# Patient Record
Sex: Female | Born: 1990 | Race: White | Hispanic: No | Smoking: Never smoker
Health system: Southern US, Community
[De-identification: ages and names within clinical notes are randomized; demographics above are authoritative.]

## PROBLEM LIST (undated history)

## (undated) ENCOUNTER — Emergency Department (HOSPITAL_COMMUNITY): Admission: EM | Payer: BLUE CROSS/BLUE SHIELD | Source: Home / Self Care

## (undated) ENCOUNTER — Inpatient Hospital Stay (HOSPITAL_COMMUNITY): Payer: Self-pay

## (undated) DIAGNOSIS — I10 Essential (primary) hypertension: Secondary | ICD-10-CM

## (undated) DIAGNOSIS — Z8489 Family history of other specified conditions: Secondary | ICD-10-CM

## (undated) DIAGNOSIS — A4902 Methicillin resistant Staphylococcus aureus infection, unspecified site: Secondary | ICD-10-CM

## (undated) DIAGNOSIS — N921 Excessive and frequent menstruation with irregular cycle: Secondary | ICD-10-CM

## (undated) DIAGNOSIS — Z9889 Other specified postprocedural states: Secondary | ICD-10-CM

## (undated) DIAGNOSIS — O139 Gestational [pregnancy-induced] hypertension without significant proteinuria, unspecified trimester: Secondary | ICD-10-CM

## (undated) DIAGNOSIS — L732 Hidradenitis suppurativa: Secondary | ICD-10-CM

## (undated) DIAGNOSIS — N83209 Unspecified ovarian cyst, unspecified side: Secondary | ICD-10-CM

## (undated) DIAGNOSIS — R000 Tachycardia, unspecified: Secondary | ICD-10-CM

## (undated) DIAGNOSIS — D649 Anemia, unspecified: Secondary | ICD-10-CM

## (undated) HISTORY — DX: Essential (primary) hypertension: I10

## (undated) HISTORY — DX: Methicillin resistant Staphylococcus aureus infection, unspecified site: A49.02

## (undated) HISTORY — DX: Morbid (severe) obesity due to excess calories: E66.01

## (undated) HISTORY — DX: Unspecified ovarian cyst, unspecified side: N83.209

## (undated) HISTORY — DX: Anemia, unspecified: D64.9

## (undated) HISTORY — DX: Hidradenitis suppurativa: L73.2

## (undated) HISTORY — DX: Excessive and frequent menstruation with irregular cycle: N92.1

---
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
## 2009-04-28 ENCOUNTER — Emergency Department (HOSPITAL_COMMUNITY): Admission: EM | Admit: 2009-04-28 | Discharge: 2009-04-29 | Payer: Self-pay | Admitting: Emergency Medicine

## 2009-07-16 ENCOUNTER — Emergency Department (HOSPITAL_COMMUNITY): Admission: EM | Admit: 2009-07-16 | Discharge: 2009-07-17 | Payer: Self-pay | Admitting: Emergency Medicine

## 2010-03-24 LAB — RAPID STREP SCREEN (MED CTR MEBANE ONLY): Streptococcus, Group A Screen (Direct): NEGATIVE

## 2010-09-08 ENCOUNTER — Emergency Department (HOSPITAL_COMMUNITY)
Admission: EM | Admit: 2010-09-08 | Discharge: 2010-09-08 | Disposition: A | Discharge status: Home / Self Care | Payer: Self-pay | Attending: Emergency Medicine | Admitting: Emergency Medicine

## 2010-09-08 ENCOUNTER — Encounter: Payer: Self-pay | Admitting: *Deleted

## 2010-09-08 DIAGNOSIS — L0291 Cutaneous abscess, unspecified: Secondary | ICD-10-CM

## 2010-09-08 DIAGNOSIS — N61 Mastitis without abscess: Secondary | ICD-10-CM | POA: Insufficient documentation

## 2010-09-08 MED ORDER — DOXYCYCLINE HYCLATE 100 MG PO CAPS: 100.0000 mg | ORAL_CAPSULE | Freq: Two times a day (BID) | ORAL | Status: AC

## 2010-09-08 NOTE — ED Notes (Signed)
Pt has skin lesion on left breast with drainage x 4 days. Pt states that she had a boil there 1 year ago and it left a scar. States that she has had no pain but the "hole" came 4 days ago. Draining white foul smelling drainage.

## 2010-09-08 NOTE — ED Provider Notes (Signed)
History     CSN: [id number] Arrival date & time: 09/08/2010 12:34 PM  Chief Complaint  Patient presents with  . Wound Infection   HPI Alexis Montgomery is a 20 y.o. female who presents to the Emergency Department complaining of wound infection on the left inner breast, onset Wednesday 09/04/2010. Patient reports that infection is on the site of a scar from an old boil. Reports white foul smelling drainage from site. TB not up to date. Patient denies nausea, fever, vomiting. Patient is in no apparent distress. There are no other associated symptoms and no other alleviating or aggravating factors.     History reviewed. No pertinent past medical history.  MEDICATIONS:  Previous Medications   No medications on file     ALLERGIES:  Allergies as of 09/08/2010  . (No Known Allergies)      History reviewed. No pertinent past surgical history.  History reviewed. No pertinent family history.  History  Substance Use Topics  . Smoking status: Never Smoker   . Smokeless tobacco: Not on file  . Alcohol Use: No    OB History    Grav Para Term Preterm Abortions TAB SAB Ect Mult Living                  Review of Systems  All other systems reviewed and are negative.    Physical Exam  BP 136/93  Pulse 98  Temp(Src) 98.5 F (36.9 C) (Oral)  Resp 16  Ht 5\' 3"  (1.6 m)  Wt 200 lb (90.719 kg)  BMI 35.43 kg/m2  SpO2 100%  LMP 08/03/2010  Physical Exam  Nursing note and vitals reviewed. Constitutional: She is oriented to person, place, and time. She appears well-developed and well-nourished. No distress.  HENT:  Head: Normocephalic and atraumatic.  Right Ear: External ear normal.  Left Ear: External ear normal.  Mouth/Throat: Oropharynx is clear and moist. No oropharyngeal exudate.  Eyes: Conjunctivae are normal. Pupils are equal, round, and reactive to light.  Neck: Normal range of motion. Neck supple. No tracheal deviation present.  Cardiovascular: Normal rate, regular rhythm  and normal heart sounds.   No murmur heard. Pulmonary/Chest: Effort normal and breath sounds normal. No respiratory distress. She has no wheezes. She has no rales.  Abdominal: Soft. Bowel sounds are normal. She exhibits no distension. There is no tenderness. There is no rebound.  Musculoskeletal: Normal range of motion. She exhibits no edema.  Neurological: She is alert and oriented to person, place, and time. No cranial nerve deficit. Coordination normal.  Skin: Skin is warm and dry. No rash noted. She is not diaphoretic. No erythema.       1cm drained abscess on the inner left breast  Psychiatric: She has a normal mood and affect. Her behavior is normal.     ED Course  Procedures  OTHER DATA REVIEWED: Nursing notes, vital signs, and past medical records reviewed.    DIAGNOSTIC STUDIES: Oxygen Saturation is 100% on room air, normal by my interpretation.    LABS / RADIOLOGY:    ED COURSE / COORDINATION OF CARE: 15:05 - EDMD examined patient  MDM:   RECURRENT 1 CM BREAST ABSCESS ALREADY DRAINING. NO I&D REQUIRED.   IMPRESSION: Diagnoses that have been ruled out:  Diagnoses that are still under consideration:  Final diagnoses:    PLAN:  Home  The patient is to return the emergency department if there is any worsening of symptoms. I have reviewed the discharge instructions with the patient  CONDITION ON DISCHARGE: Stable  MEDICATIONS GIVEN IN THE E.D. Medications - No data to display  DISCHARGE MEDICATIONS: New Prescriptions   No medications on file    SCRIBE ATTESTATION:   I personally performed the services described in this documentation, which was scribed in my presence. The recorded information has been reviewed and considered. Shelda Jakes, MD        Shelda Jakes, MD 09/08/10 [phone]

## 2011-04-22 ENCOUNTER — Other Ambulatory Visit (HOSPITAL_COMMUNITY)
Admission: RE | Admit: 2011-04-22 | Discharge: 2011-04-22 | Disposition: A | Discharge status: Home / Self Care | Payer: Self-pay | Source: Ambulatory Visit | Attending: Obstetrics and Gynecology | Admitting: Obstetrics and Gynecology

## 2011-04-22 DIAGNOSIS — Z01419 Encounter for gynecological examination (general) (routine) without abnormal findings: Secondary | ICD-10-CM | POA: Insufficient documentation

## 2011-07-30 ENCOUNTER — Emergency Department (HOSPITAL_COMMUNITY)
Admission: EM | Admit: 2011-07-30 | Discharge: 2011-07-30 | Disposition: A | Discharge status: Home / Self Care | Payer: Self-pay | Attending: Emergency Medicine | Admitting: Emergency Medicine

## 2011-07-30 ENCOUNTER — Encounter (HOSPITAL_COMMUNITY): Payer: Self-pay | Admitting: *Deleted

## 2011-07-30 DIAGNOSIS — J329 Chronic sinusitis, unspecified: Secondary | ICD-10-CM | POA: Insufficient documentation

## 2011-07-30 MED ORDER — FLUTICASONE PROPIONATE 50 MCG/ACT NA SUSP: 2.0000 | Freq: Every day | NASAL | Status: DC

## 2011-07-30 MED ORDER — AMOXICILLIN 500 MG PO CAPS: ORAL_CAPSULE | ORAL | Status: DC

## 2011-07-30 NOTE — ED Provider Notes (Signed)
Medical screening examination/treatment/procedure(s) were performed by non-physician practitioner and as supervising physician I was immediately available for consultation/collaboration.   Laray Anger, DO 07/30/11 1912

## 2011-07-30 NOTE — ED Provider Notes (Signed)
History     CSN: [id number]  Arrival date & time 07/30/11  1350   First MD Initiated Contact with Patient 07/30/11 1409      Chief Complaint  Patient presents with  . Headache    (Consider location/radiation/quality/duration/timing/severity/associated sxs/prior treatment) HPI Comments: Denies fever or chills.  Patient is a 21 y.o. female presenting with headaches. The history is provided by the patient. No language interpreter was used.  Headache  This is a new problem. The problem occurs constantly. The problem has been gradually worsening. Associated with: nasal congestion and sinus pain. The pain is located in the frontal, occipital and bilateral region. The quality of the pain is described as dull. The pain is at a severity of 7/10. Pertinent negatives include no fever. Treatments tried: afrin spray. The treatment provided mild (temporary relief) relief.    History reviewed. No pertinent past medical history.  History reviewed. No pertinent past surgical history.  History reviewed. No pertinent family history.  History  Substance Use Topics  . Smoking status: Never Smoker   . Smokeless tobacco: Not on file  . Alcohol Use: No    OB History    Grav Para Term Preterm Abortions TAB SAB Ect Mult Living                  Review of Systems  Constitutional: Negative for fever and chills.  HENT: Positive for rhinorrhea and sinus pressure.   Neurological: Positive for headaches.  All other systems reviewed and are negative.    Allergies  Review of patient's allergies indicates no known allergies.  Home Medications   Current Outpatient Rx  Name Route Sig Dispense Refill  . AMOXICILLIN 500 MG PO CAPS  3 tabs po BID 42 capsule 0  . FLUTICASONE PROPIONATE 50 MCG/ACT NA SUSP Nasal Place 2 sprays into the nose daily. 16 g 1    BP 134/90  Pulse 111  Temp 98.8 F (37.1 C) (Oral)  Resp 20  Ht 5\' 4"  (1.626 m)  Wt 195 lb (88.451 kg)  BMI 33.47 kg/m2  SpO2 99%  LMP  06/09/2011  Physical Exam  Nursing note and vitals reviewed. Constitutional: She is oriented to person, place, and time. She appears well-developed and well-nourished. No distress.  HENT:  Head: Normocephalic and atraumatic.    Eyes: EOM are normal.  Neck: Normal range of motion.  Cardiovascular: Normal rate, regular rhythm and normal heart sounds.   Pulmonary/Chest: Effort normal and breath sounds normal.  Abdominal: Soft. She exhibits no distension. There is no tenderness.  Musculoskeletal: Normal range of motion.  Neurological: She is alert and oriented to person, place, and time.  Skin: Skin is warm and dry.  Psychiatric: She has a normal mood and affect. Judgment normal.    ED Course  Procedures (including critical care time)  Labs Reviewed - No data to display No results found.   1. Sinus infection       MDM  rx amoxicillin 500 mg,  3 po BID, 42 rx-flonase spray.        Evalina Field, [place] 07/30/11 1453

## 2011-07-30 NOTE — ED Notes (Signed)
Nasal congestion, requests note for work

## 2011-07-30 NOTE — ED Notes (Signed)
Headache, runny nose, feels shakey

## 2011-10-02 LAB — OB RESULTS CONSOLE ABO/RH: RH Type: POSITIVE

## 2011-10-02 LAB — OB RESULTS CONSOLE GC/CHLAMYDIA: Chlamydia: NEGATIVE

## 2011-10-02 LAB — OB RESULTS CONSOLE RUBELLA ANTIBODY, IGM: Rubella: IMMUNE

## 2011-10-02 LAB — OB RESULTS CONSOLE RPR: RPR: NONREACTIVE

## 2011-10-02 LAB — OB RESULTS CONSOLE ANTIBODY SCREEN: Antibody Screen: NEGATIVE

## 2012-03-02 LAB — OB RESULTS CONSOLE RPR: RPR: NONREACTIVE

## 2012-03-23 ENCOUNTER — Ambulatory Visit (INDEPENDENT_AMBULATORY_CARE_PROVIDER_SITE_OTHER): Payer: Medicaid Other | Admitting: Advanced Practice Midwife

## 2012-03-23 ENCOUNTER — Encounter: Payer: Self-pay | Admitting: Advanced Practice Midwife

## 2012-03-23 VITALS — BP 130/90 | Wt 217.0 lb

## 2012-03-23 DIAGNOSIS — Z331 Pregnant state, incidental: Secondary | ICD-10-CM

## 2012-03-23 DIAGNOSIS — Z3480 Encounter for supervision of other normal pregnancy, unspecified trimester: Secondary | ICD-10-CM | POA: Insufficient documentation

## 2012-03-23 DIAGNOSIS — Z1389 Encounter for screening for other disorder: Secondary | ICD-10-CM

## 2012-03-23 DIAGNOSIS — Z3483 Encounter for supervision of other normal pregnancy, third trimester: Secondary | ICD-10-CM

## 2012-03-23 DIAGNOSIS — Z3493 Encounter for supervision of normal pregnancy, unspecified, third trimester: Secondary | ICD-10-CM

## 2012-03-23 DIAGNOSIS — Z131 Encounter for screening for diabetes mellitus: Secondary | ICD-10-CM

## 2012-03-23 DIAGNOSIS — O0900 Supervision of pregnancy with history of infertility, unspecified trimester: Secondary | ICD-10-CM

## 2012-03-23 LAB — POCT URINALYSIS DIPSTICK: Nitrite, UA: NEGATIVE

## 2012-03-23 LAB — GLUCOSE TOLERANCE, 2 HOURS: Glucose, Fasting: 77 mg/dL (ref 60–109)

## 2012-03-23 LAB — GLUCOSE, POCT (MANUAL RESULT ENTRY): POC Glucose: 68 mg/dl — AB (ref 70–99)

## 2012-03-23 NOTE — Progress Notes (Signed)
Pt also had +1 leuk and a trace of blood in urine.

## 2012-03-23 NOTE — Progress Notes (Signed)
Pt slipped on a step yesterday.  No bleeding or pain.  Did not go to hospital.  Pt advised that she should have gone/should go if this ever happens again.  Reports good fetal movement.

## 2012-04-06 ENCOUNTER — Ambulatory Visit (INDEPENDENT_AMBULATORY_CARE_PROVIDER_SITE_OTHER): Payer: Medicaid Other | Admitting: Advanced Practice Midwife

## 2012-04-06 ENCOUNTER — Encounter: Payer: Self-pay | Admitting: Advanced Practice Midwife

## 2012-04-06 VITALS — BP 130/80 | Wt 221.0 lb

## 2012-04-06 DIAGNOSIS — Z331 Pregnant state, incidental: Secondary | ICD-10-CM

## 2012-04-06 DIAGNOSIS — Z3483 Encounter for supervision of other normal pregnancy, third trimester: Secondary | ICD-10-CM

## 2012-04-06 DIAGNOSIS — Z3402 Encounter for supervision of normal first pregnancy, second trimester: Secondary | ICD-10-CM

## 2012-04-06 DIAGNOSIS — O0900 Supervision of pregnancy with history of infertility, unspecified trimester: Secondary | ICD-10-CM

## 2012-04-06 DIAGNOSIS — Z1389 Encounter for screening for other disorder: Secondary | ICD-10-CM

## 2012-04-06 LAB — POCT URINALYSIS DIPSTICK
Ketones, UA: NEGATIVE
Protein, UA: 1

## 2012-04-06 NOTE — Progress Notes (Signed)
No c/o at this time.  Routine questions about pregnancy andswered.  F/U in 2 weeks for LROB. Working 2d/wk now

## 2012-04-06 NOTE — Progress Notes (Signed)
Pain in stomach after standing for 30 minutes.

## 2012-04-06 NOTE — Assessment & Plan Note (Addendum)
Clinic:[facility]  Genetic Screen NT:        normal                    First Screen:               Quad Screen/MSAFP:  Anatomic [place] normal  Glucose Screen 77/123/109 on 2/28  GBS   Feeding Preference breast  Contraception nexplanon  Circumcision yes

## 2012-04-18 ENCOUNTER — Encounter (HOSPITAL_COMMUNITY): Payer: Self-pay

## 2012-04-18 ENCOUNTER — Inpatient Hospital Stay (HOSPITAL_COMMUNITY)
Admission: AD | Admit: 2012-04-18 | Discharge: 2012-04-26 | DRG: 765 | Disposition: A | Discharge status: Home / Self Care | Payer: Medicaid Other | Source: Ambulatory Visit | Attending: Obstetrics and Gynecology | Admitting: Obstetrics and Gynecology

## 2012-04-18 DIAGNOSIS — Z98891 History of uterine scar from previous surgery: Secondary | ICD-10-CM

## 2012-04-18 DIAGNOSIS — Z3483 Encounter for supervision of other normal pregnancy, third trimester: Secondary | ICD-10-CM

## 2012-04-18 DIAGNOSIS — O1493 Unspecified pre-eclampsia, third trimester: Secondary | ICD-10-CM

## 2012-04-18 DIAGNOSIS — O1490 Unspecified pre-eclampsia, unspecified trimester: Secondary | ICD-10-CM

## 2012-04-18 DIAGNOSIS — O99892 Other specified diseases and conditions complicating childbirth: Secondary | ICD-10-CM | POA: Diagnosis present

## 2012-04-18 DIAGNOSIS — Z22330 Carrier of Group B streptococcus: Secondary | ICD-10-CM

## 2012-04-18 DIAGNOSIS — Z3400 Encounter for supervision of normal first pregnancy, unspecified trimester: Secondary | ICD-10-CM | POA: Insufficient documentation

## 2012-04-18 DIAGNOSIS — IMO0002 Reserved for concepts with insufficient information to code with codable children: Secondary | ICD-10-CM | POA: Diagnosis present

## 2012-04-18 HISTORY — DX: Gestational (pregnancy-induced) hypertension without significant proteinuria, unspecified trimester: O13.9

## 2012-04-18 LAB — CBC
HCT: 30.4 % — ABNORMAL LOW (ref 36.0–46.0)
MCV: 79 fL (ref 78.0–100.0)
Platelets: 246 10*3/uL (ref 150–400)
RBC: 3.85 MIL/uL — ABNORMAL LOW (ref 3.87–5.11)
RDW: 13.4 % (ref 11.5–15.5)
WBC: 10.5 10*3/uL (ref 4.0–10.5)

## 2012-04-18 LAB — URINE MICROSCOPIC-ADD ON

## 2012-04-18 LAB — URINALYSIS, ROUTINE W REFLEX MICROSCOPIC
Glucose, UA: NEGATIVE mg/dL
Protein, ur: >300 mg/dL — AB
Specific Gravity, Urine: 1.025 (ref 1.005–1.030)
Urobilinogen, UA: 0.2 mg/dL (ref 0.0–1.0)

## 2012-04-18 MED ORDER — LABETALOL HCL 5 MG/ML IV SOLN
10.0000 mg | INTRAVENOUS | Status: DC | PRN
  Administered 2012-04-18: 10 mg via INTRAVENOUS
  Filled 2012-04-18: qty 4

## 2012-04-18 MED ORDER — LACTATED RINGERS IV SOLN
INTRAVENOUS | Status: DC
  Administered 2012-04-18: 125 mL/h via INTRAVENOUS
  Administered 2012-04-19 (×3): via INTRAVENOUS

## 2012-04-18 NOTE — MAU Note (Signed)
Took BP at home because feet were swollen, it was 173/122. Denies headache/dizziness/visual changes/epigastric pain. Positive fetal movement. Denies contractions/leaking of fluid/vaginal bleeding.

## 2012-04-19 ENCOUNTER — Inpatient Hospital Stay (HOSPITAL_COMMUNITY): Payer: Medicaid Other

## 2012-04-19 ENCOUNTER — Encounter (HOSPITAL_COMMUNITY): Payer: Self-pay | Admitting: *Deleted

## 2012-04-19 LAB — COMPREHENSIVE METABOLIC PANEL
AST: 11 U/L (ref 0–37)
Albumin: 1.8 g/dL — ABNORMAL LOW (ref 3.5–5.2)
Alkaline Phosphatase: 117 U/L (ref 39–117)
CO2: 23 mEq/L (ref 19–32)
Chloride: 103 mEq/L (ref 96–112)
Creatinine, Ser: 0.71 mg/dL (ref 0.50–1.10)
GFR calc non Af Amer: >90 mL/min (ref >90)
Potassium: 3.8 mEq/L (ref 3.5–5.1)
Total Bilirubin: 0.1 mg/dL — ABNORMAL LOW (ref 0.3–1.2)

## 2012-04-19 LAB — URINE MICROSCOPIC-ADD ON

## 2012-04-19 LAB — URINALYSIS, ROUTINE W REFLEX MICROSCOPIC
Leukocytes, UA: NEGATIVE
Nitrite: NEGATIVE
Specific Gravity, Urine: 1.025 (ref 1.005–1.030)
pH: 6.5 (ref 5.0–8.0)

## 2012-04-19 LAB — TYPE AND SCREEN

## 2012-04-19 MED ORDER — PRENATAL MULTIVITAMIN CH
1.0000 | ORAL_TABLET | Freq: Every day | ORAL | Status: DC
  Administered 2012-04-20: 1 via ORAL
  Filled 2012-04-19: qty 1

## 2012-04-19 MED ORDER — ZOLPIDEM TARTRATE 5 MG PO TABS: 5.0000 mg | ORAL_TABLET | Freq: Every evening | ORAL | Status: DC | PRN

## 2012-04-19 MED ORDER — DOCUSATE SODIUM 100 MG PO CAPS
100.0000 mg | ORAL_CAPSULE | Freq: Every day | ORAL | Status: DC
  Administered 2012-04-20: 100 mg via ORAL
  Filled 2012-04-19: qty 1

## 2012-04-19 MED ORDER — ACETAMINOPHEN 325 MG PO TABS: 650.0000 mg | ORAL_TABLET | ORAL | Status: DC | PRN

## 2012-04-19 MED ORDER — CALCIUM CARBONATE ANTACID 500 MG PO CHEW
2.0000 | CHEWABLE_TABLET | ORAL | Status: DC | PRN
  Administered 2012-04-19: 400 mg via ORAL
  Filled 2012-04-19: qty 2

## 2012-04-19 MED ORDER — ONDANSETRON 4 MG PO TBDP
4.0000 mg | ORAL_TABLET | Freq: Four times a day (QID) | ORAL | Status: DC | PRN
  Administered 2012-04-20: 4 mg via ORAL
  Filled 2012-04-19: qty 1

## 2012-04-19 NOTE — Progress Notes (Signed)
Pt back from ultrasound.

## 2012-04-19 NOTE — H&P (Signed)
Alexis Montgomery is a 22 y.o. female G1 at 35.0wks presenting for eval of elevated BP noted at home as well as swelling of LE. Denies visual disturbances, RUQ pain, or H/A. No N/V/D. Reports +FM. No ctx, leak or bldg. Her preg has been followed by [facility] and has been essentially unremarkable. History OB History   Grav Para Term Preterm Abortions TAB SAB Ect Mult Living   1 0 0 0 0 0 0 0 0 0      Past Medical History  Diagnosis Date  . Medical history non-contributory    Past Surgical History  Procedure Laterality Date  . No past surgeries     Family History: family history includes Diabetes in her paternal uncle and HIV in her mother. Social History:  reports that she has never smoked. She does not have any smokeless tobacco history on file. She reports that she does not drink alcohol or use illicit drugs.   Prenatal Transfer Tool  Maternal Diabetes: No Genetic Screening: Normal Maternal Ultrasounds/Referrals: Normal Fetal Ultrasounds or other Referrals:  None Maternal Substance Abuse:  No Significant Maternal Medications:  None Significant Maternal Lab Results:  Lab values include: Other: GBS PCR pending Other Comments:  None  ROS  Dilation: 1 Effacement (%): 20 Station: -2 Exam by:: Dr. Emelda Fear Blood pressure 142/82, pulse 86, temperature 97.9 F (36.6 C), temperature source Oral, resp. rate 18, height 5\' 3"  (1.6 m), weight 222 lb (100.699 kg). Maternal Exam:  Uterine Assessment: Rare mild ctx     Fetal Exam Fetal Monitor Review: Baseline rate: 135.  Variability: moderate (6-25 bpm).   Pattern: no decelerations and accelerations present.       Physical Exam  Constitutional: She appears well-developed.  HENT:  Head: Normocephalic.  Neck: Normal range of motion.  Cardiovascular: Normal rate.   Respiratory: Effort normal.  Musculoskeletal: Normal range of motion. She exhibits edema.  2+ edema of BLE R>L  Neurological:  No clonus  Skin: Skin is warm and  dry.  Psychiatric: She has a normal mood and affect. Her behavior is normal. Thought content normal.    CBC    Component Value Date/Time   WBC 10.5 04/18/2012 2306   RBC 3.85* 04/18/2012 2306   HGB 10.2* 04/18/2012 2306   HGB 11.5 03/02/2012   HCT 30.4* 04/18/2012 2306   HCT 34 03/02/2012   PLT 246 04/18/2012 2306   MCV 79.0 04/18/2012 2306   MCH 26.5 04/18/2012 2306   MCHC 33.6 04/18/2012 2306   RDW 13.4 04/18/2012 2306   CMP     Component Value Date/Time   NA 133* 04/18/2012 2306   K 3.8 04/18/2012 2306   CL 103 04/18/2012 2306   CO2 23 04/18/2012 2306   GLUCOSE 76 04/18/2012 2306   BUN 8 04/18/2012 2306   CREATININE 0.71 04/18/2012 2306   CALCIUM 8.8 04/18/2012 2306   PROT 5.0* 04/18/2012 2306   ALBUMIN 1.8* 04/18/2012 2306   AST 11 04/18/2012 2306   ALT 7 04/18/2012 2306   ALKPHOS 117 04/18/2012 2306   BILITOT 0.1* 04/18/2012 2306   GFRNONAA >90 04/18/2012 2306   GFRAA >90 04/18/2012 2306   Urine pr/cr ratio: 10.2  Prenatal labs: ABO, Rh: A/Positive/-- (09/26 0000) Antibody: Negative (09/26 0000) Rubella: Immune (09/26 0000) RPR: Nonreactive (02/25 0000)  HBsAg: Negative (09/26 0000)  HIV: Non-reactive (02/25 0000)  GBS:     Assessment/Plan: IUP at 35.0wks GHTN r/o preeclampsia  Admit to Antenatal 24 hr urine collection Watch BPs GBS  pending  Growth U/S in AM  Alexis Montgomery 04/19/2012, 2:50 AM

## 2012-04-19 NOTE — Progress Notes (Signed)
To ultrasound via wheelchair.

## 2012-04-19 NOTE — H&P (Signed)
Attestation of Attending Supervision of Advanced Practitioner: Evaluation and management procedures were performed by the PA/NP/CNM/OB Fellow under my supervision/collaboration. Chart reviewed and agree with management and plan.  21 yr G1P0 at 35.0 weeks, admitted for preeclampsia with elevated BP at home, 170 systolic on home check, with bp's here up to 169/101 x 1,  140/82 this a.m., 3+ protein on cath specimen that is otherwise negative, Pr/Cr ELEVATED at 10.2, LFT normal with ast 11, alt 7, and platelets 246k.   Reflexes 1+, Cervix 1 cm, soft, 20%-2 vertex with heavy dischg negative for trich/yeast/clue cells, positive for WBC.  GBS POSitive   NO headache, scotoma, ruq pain.  Pt is admitted for 24 hr TP collection, and serial BP. I anticipate prolonged hospitalization, with IOL if severe BP criteria met  Alexis Montgomery V 04/19/2012 6:20 AM

## 2012-04-19 NOTE — Progress Notes (Signed)
Ur chart review completed.  

## 2012-04-20 ENCOUNTER — Encounter (HOSPITAL_COMMUNITY): Payer: Self-pay | Admitting: *Deleted

## 2012-04-20 DIAGNOSIS — O1490 Unspecified pre-eclampsia, unspecified trimester: Secondary | ICD-10-CM

## 2012-04-20 DIAGNOSIS — O99892 Other specified diseases and conditions complicating childbirth: Secondary | ICD-10-CM

## 2012-04-20 DIAGNOSIS — IMO0002 Reserved for concepts with insufficient information to code with codable children: Secondary | ICD-10-CM

## 2012-04-20 DIAGNOSIS — Z3480 Encounter for supervision of other normal pregnancy, unspecified trimester: Secondary | ICD-10-CM

## 2012-04-20 LAB — PROTEIN, URINE, 24 HOUR
Collection Interval-UPROT: 24 hours
Protein, 24H Urine: 18662 mg/d — ABNORMAL HIGH (ref 50–100)
Urine Total Volume-UPROT: 2925 mL

## 2012-04-20 LAB — CBC
HCT: 33.6 % — ABNORMAL LOW (ref 36.0–46.0)
MCV: 80 fL (ref 78.0–100.0)
Platelets: 271 10*3/uL (ref 150–400)
RBC: 4.2 MIL/uL (ref 3.87–5.11)
WBC: 10.2 10*3/uL (ref 4.0–10.5)

## 2012-04-20 LAB — COMPREHENSIVE METABOLIC PANEL
AST: 13 U/L (ref 0–37)
Alkaline Phosphatase: 122 U/L — ABNORMAL HIGH (ref 39–117)
CO2: 23 mEq/L (ref 19–32)
Chloride: 102 mEq/L (ref 96–112)
Creatinine, Ser: 0.63 mg/dL (ref 0.50–1.10)
GFR calc non Af Amer: >90 mL/min (ref >90)
Potassium: 3.7 mEq/L (ref 3.5–5.1)
Total Bilirubin: 0.1 mg/dL — ABNORMAL LOW (ref 0.3–1.2)

## 2012-04-20 LAB — GROUP B STREP BY PCR: Group B strep by PCR: POSITIVE — AB

## 2012-04-20 MED ORDER — PENICILLIN G POTASSIUM 5000000 UNITS IJ SOLR
2.5000 10*6.[IU] | INTRAVENOUS | Status: DC
  Administered 2012-04-21 – 2012-04-23 (×14): 2.5 10*6.[IU] via INTRAVENOUS
  Filled 2012-04-20 (×16): qty 2.5

## 2012-04-20 MED ORDER — CITRIC ACID-SODIUM CITRATE 334-500 MG/5ML PO SOLN
30.0000 mL | ORAL | Status: DC | PRN
  Administered 2012-04-23: 30 mL via ORAL
  Filled 2012-04-20: qty 15

## 2012-04-20 MED ORDER — OXYTOCIN 40 UNITS IN LACTATED RINGERS INFUSION - SIMPLE MED
62.5000 mL/h | INTRAVENOUS | Status: DC
  Filled 2012-04-20: qty 1000

## 2012-04-20 MED ORDER — MISOPROSTOL 25 MCG QUARTER TABLET
25.0000 ug | ORAL_TABLET | ORAL | Status: DC | PRN
  Administered 2012-04-20 – 2012-04-22 (×3): 25 ug via VAGINAL
  Filled 2012-04-20 (×4): qty 0.25

## 2012-04-20 MED ORDER — MAGNESIUM SULFATE BOLUS VIA INFUSION
4.0000 g | Freq: Once | INTRAVENOUS | Status: AC
  Administered 2012-04-20: 4 g via INTRAVENOUS
  Filled 2012-04-20: qty 500

## 2012-04-20 MED ORDER — LACTATED RINGERS IV SOLN
INTRAVENOUS | Status: DC
  Administered 2012-04-20 – 2012-04-22 (×5): via INTRAVENOUS

## 2012-04-20 MED ORDER — MAGNESIUM SULFATE 40 G IN LACTATED RINGERS - SIMPLE
2.0000 g/h | INTRAVENOUS | Status: DC
  Administered 2012-04-21 – 2012-04-22 (×2): 2 g/h via INTRAVENOUS
  Filled 2012-04-20 (×3): qty 500

## 2012-04-20 MED ORDER — ACETAMINOPHEN 325 MG PO TABS
650.0000 mg | ORAL_TABLET | ORAL | Status: DC | PRN
  Administered 2012-04-21 (×2): 650 mg via ORAL
  Filled 2012-04-20 (×2): qty 2

## 2012-04-20 MED ORDER — LABETALOL HCL 5 MG/ML IV SOLN
10.0000 mg | Freq: Once | INTRAVENOUS | Status: AC
  Administered 2012-04-20: 10 mg via INTRAVENOUS
  Filled 2012-04-20 (×2): qty 4

## 2012-04-20 MED ORDER — PENICILLIN G POTASSIUM 5000000 UNITS IJ SOLR
5.0000 10*6.[IU] | Freq: Once | INTRAVENOUS | Status: AC
  Administered 2012-04-20: 5 10*6.[IU] via INTRAVENOUS
  Filled 2012-04-20 (×2): qty 5

## 2012-04-20 MED ORDER — IBUPROFEN 600 MG PO TABS: 600.0000 mg | ORAL_TABLET | Freq: Four times a day (QID) | ORAL | Status: DC | PRN

## 2012-04-20 MED ORDER — OXYCODONE-ACETAMINOPHEN 5-325 MG PO TABS: 1.0000 | ORAL_TABLET | ORAL | Status: DC | PRN

## 2012-04-20 MED ORDER — ZOLPIDEM TARTRATE 5 MG PO TABS: 5.0000 mg | ORAL_TABLET | Freq: Every evening | ORAL | Status: DC | PRN

## 2012-04-20 MED ORDER — OXYTOCIN BOLUS FROM INFUSION: 500.0000 mL | INTRAVENOUS | Status: DC

## 2012-04-20 MED ORDER — LACTATED RINGERS IV SOLN
500.0000 mL | INTRAVENOUS | Status: DC | PRN
  Administered 2012-04-22 – 2012-04-23 (×3): 500 mL via INTRAVENOUS

## 2012-04-20 MED ORDER — FAMOTIDINE 20 MG PO TABS
20.0000 mg | ORAL_TABLET | Freq: Two times a day (BID) | ORAL | Status: DC
  Administered 2012-04-20: 20 mg via ORAL
  Filled 2012-04-20: qty 1

## 2012-04-20 MED ORDER — LIDOCAINE HCL (PF) 1 % IJ SOLN: 30.0000 mL | INTRAMUSCULAR | Status: DC | PRN

## 2012-04-20 MED ORDER — ONDANSETRON HCL 4 MG/2ML IJ SOLN
4.0000 mg | Freq: Four times a day (QID) | INTRAMUSCULAR | Status: DC | PRN
  Administered 2012-04-21 – 2012-04-23 (×2): 4 mg via INTRAVENOUS
  Filled 2012-04-20 (×2): qty 2

## 2012-04-20 NOTE — Progress Notes (Signed)
   Subjective: Pt transferred over to [facility] due to elevated blood pressure and report of headache.    Objective: BP 182/106  Pulse 74  Temp(Src) 98.1 F (36.7 C) (Oral)  Resp 20  Ht 5\' 3"  (1.6 m)  Wt 101.606 kg (224 lb)  BMI 39.69 kg/m2      FHT:  FHR: 150's bpm, variability: minimal ,  accelerations:  Present,  decelerations:  Absent; 10x10 accels UC:   irritability SVE:   Dilation: 1 Effacement (%): Thick Station: -3 Exam by:: Roney Marion CNM  Labs: Lab Results  Component Value Date   WBC 10.2 04/20/2012   HGB 11.1* 04/20/2012   HCT 33.6* 04/20/2012   MCV 80.0 04/20/2012   PLT 271 04/20/2012    Assessment / Plan: Induction of Labor - Preeclampsia  Labor: Induction of Labor - Preeclampsia Preeclampsia:  Begin magnesium sulfate; obtain cbc and cmp. Fetal Wellbeing:  Category II Pain Control:  Labor support without medications I/D:  GBS pos > begin PCN Anticipated MOD:  NSVD  [facility] 04/20/2012, 10:29 PM

## 2012-04-20 NOTE — Progress Notes (Signed)
Patient ID: Alexis Montgomery, female   DOB: 10-11-1990, 22 y.o.   MRN: [id number]  FACULTY PRACTICE ANTEPARTUM COMPREHENSIVE PROGRESS NOTE  Alexis Montgomery is a 22 y.o. G1P0000 at [redacted]w[redacted]d  who is admitted for preeclampsia.  Estimated Date of Delivery: 05/24/12 Fetal presentation is cephalic (by ultrasound 04/19/12).  Length of Stay:  2 Days. 04/18/2012  Subjective: Pt denies headache, vision changes or right upper quadrant pain. Has had 2 episodes of vomiting, after meals. No current nausea. Patient reports good fetal movement.  She reports occasional mild uterine contractions, no bleeding and no loss of fluid per vagina.  Vitals:  Blood pressure 172/110, pulse 75, temperature 98.5 F (36.9 C), temperature source Oral, resp. rate 18, height 5\' 3"  (1.6 m), weight 100.699 kg (222 lb). Physical Examination: General appearance - alert, well appearing, and in no distress Eyes - extraocular eye movements intact Chest - clear to auscultation, no wheezes, rales or rhonchi, symmetric air entry Heart - normal rate, regular rhythm, normal S1, S2, no murmurs, rubs, clicks or gallops Abdomen - Gravid, non-tender, normal bowel sounds Neurological - no focal deficits Extremities - pedal edema 2+, intact peripheral pulses, Homan's sign negative bilaterally Membranes:intact  Fetal Monitoring:  Baseline: 135 bpm, Variability: Good {> 6 bpm), Accelerations: Reactive and Decelerations: Absent  Labs:  Results for orders placed during the hospital encounter of 04/18/12 (from the past 24 hour(s))  PROTEIN, URINE, 24 HOUR   Collection Time    04/19/12 11:36 PM      Result Value Range   Urine Total Volume-UPROT 2925     Collection Interval-UPROT 24     Protein, Urine 638     Protein, 24H Urine 78295 (*) 50 - 100 mg/day    Imaging Studies:      Medications:  Scheduled . docusate sodium  100 mg Oral Daily  . famotidine  20 mg Oral BID  . prenatal multivitamin  1 tablet Oral Q1200   I have reviewed the  patient's current medications.  ASSESSMENT: Patient Active Problem List  Diagnosis  . Supervision of other normal pregnancy  . Preeclampsia   PLAN: Preeclampsia without severe features.  NSTs are reactive. Continue to monitor as fetus is still preterm. If any severe features will deliver. Continue routine antenatal care.  Napoleon Form, MD

## 2012-04-20 NOTE — Progress Notes (Signed)
Patient ID: Alexis Montgomery, female   DOB: January 14, 1990, 22 y.o.   MRN: [id number]  I was called to evaluate [name] due to her increasingly high BPs. She also complains of a new-onset headache. She and her husand are aware that an induction is in order. All questions were answered. We discussed the use of magnesium for seizure prophylaxis. I explained that an IOL with an unfavorable cervix (as her was 1 cm recently) may take up to 3 days.

## 2012-04-20 NOTE — Progress Notes (Signed)
Patient ID: Alexis Montgomery, female   DOB: 28-May-1990, 22 y.o.   MRN: [id number] Pt seen and examined and chart reviewed.  Pt with elevated BP and proteinuria consistent with preeclampsia.  However, her urine protein is >18,000.  I confirmed this with the lab.  This suggests that there may be an underlying renal disease.  Pt has a normal Cr-.  She does not exhibit any BP's in the severe range, and she has no clinical sx suggestive of severe disease.  Because she is stable clinically, premature and has an unfavorable cervix, we will observe her house for now and deliver for worsening maternal or fetal indications.  Reviewed plan of care with patient and her partner.  All questions answered.  Jersie Beel L. Harraway-Smith, M.D., [place]

## 2012-04-21 ENCOUNTER — Encounter: Payer: Medicaid Other | Admitting: Advanced Practice Midwife

## 2012-04-21 ENCOUNTER — Encounter (HOSPITAL_COMMUNITY): Payer: Self-pay | Admitting: *Deleted

## 2012-04-21 LAB — CBC
HCT: 31.5 % — ABNORMAL LOW (ref 36.0–46.0)
Hemoglobin: 10.6 g/dL — ABNORMAL LOW (ref 12.0–15.0)
MCH: 26.7 pg (ref 26.0–34.0)
MCHC: 33.7 g/dL (ref 30.0–36.0)

## 2012-04-21 MED ORDER — LABETALOL HCL 200 MG PO TABS
200.0000 mg | ORAL_TABLET | Freq: Two times a day (BID) | ORAL | Status: DC
Start: 2012-04-21 — End: 2012-04-21
  Administered 2012-04-21: 200 mg via ORAL
  Filled 2012-04-21 (×2): qty 1

## 2012-04-21 MED ORDER — OXYTOCIN 40 UNITS IN LACTATED RINGERS INFUSION - SIMPLE MED
1.0000 m[IU]/min | INTRAVENOUS | Status: DC
  Administered 2012-04-21 – 2012-04-22 (×2): 2 m[IU]/min via INTRAVENOUS
  Filled 2012-04-21: qty 1000

## 2012-04-21 MED ORDER — LABETALOL HCL 5 MG/ML IV SOLN
INTRAVENOUS | Status: AC
  Filled 2012-04-21: qty 4

## 2012-04-21 MED ORDER — LABETALOL HCL 5 MG/ML IV SOLN: 40.0000 mg | Freq: Once | INTRAVENOUS | Status: DC | PRN

## 2012-04-21 MED ORDER — FENTANYL CITRATE 0.05 MG/ML IJ SOLN
100.0000 ug | INTRAMUSCULAR | Status: DC | PRN
  Administered 2012-04-21: 100 ug via INTRAVENOUS
  Filled 2012-04-21: qty 2

## 2012-04-21 MED ORDER — LABETALOL HCL 5 MG/ML IV SOLN
10.0000 mg | Freq: Once | INTRAVENOUS | Status: AC
  Administered 2012-04-21: 10 mg via INTRAVENOUS
  Filled 2012-04-21: qty 4

## 2012-04-21 MED ORDER — LABETALOL HCL 5 MG/ML IV SOLN: 20.0000 mg | Freq: Once | INTRAVENOUS | Status: DC

## 2012-04-21 MED ORDER — LABETALOL HCL 5 MG/ML IV SOLN
20.0000 mg | Freq: Once | INTRAVENOUS | Status: DC | PRN
  Filled 2012-04-21: qty 4

## 2012-04-21 MED ORDER — LABETALOL HCL 200 MG PO TABS
200.0000 mg | ORAL_TABLET | Freq: Two times a day (BID) | ORAL | Status: DC
  Filled 2012-04-21 (×2): qty 1

## 2012-04-21 MED ORDER — LABETALOL HCL 5 MG/ML IV SOLN
10.0000 mg | Freq: Once | INTRAVENOUS | Status: AC | PRN
  Administered 2012-04-21: 10 mg via INTRAVENOUS

## 2012-04-21 MED ORDER — LABETALOL HCL 5 MG/ML IV SOLN
40.0000 mg | Freq: Once | INTRAVENOUS | Status: DC | PRN
  Filled 2012-04-21: qty 4

## 2012-04-21 MED ORDER — LABETALOL HCL 200 MG PO TABS
200.0000 mg | ORAL_TABLET | Freq: Two times a day (BID) | ORAL | Status: DC
  Administered 2012-04-22 – 2012-04-24 (×5): 200 mg via ORAL
  Filled 2012-04-21 (×8): qty 1

## 2012-04-21 MED ORDER — LABETALOL HCL 5 MG/ML IV SOLN
20.0000 mg | Freq: Once | INTRAVENOUS | Status: AC | PRN
  Administered 2012-04-22: 20 mg via INTRAVENOUS
  Filled 2012-04-21: qty 4

## 2012-04-21 NOTE — Progress Notes (Signed)
ROJEAN IGE is a 22 y.o. G1P0000 at [redacted]w[redacted]d  Subjective: Mild discomfort per RN. Foley bulb fell out ~20 minutes ago  Objective: BP 147/88  Pulse 87  Temp(Src) 98.6 F (37 C) (Oral)  Resp 18  Ht 5\' 3"  (1.6 m)  Wt 101.606 kg (224 lb)  BMI 39.69 kg/m2 I/O last 3 completed shifts: In: 1851.3 [P.O.:145; I.V.:1256.3; IV Piggyback:450] Out: 2200 [Urine:2200]   Patient Vitals for the past 24 hrs:  BP Temp Temp src Pulse Resp Height Weight  04/21/12 0747 147/88 mmHg - - 87 18 - -  04/21/12 0743 150/101 mmHg - - 95 16 - -  04/21/12 0724 173/111 mmHg 98.6 F (37 C) Oral 90 18 - -  04/21/12 0722 167/111 mmHg - - 87 - - -  04/21/12 0657 149/93 mmHg - - 85 18 - -  04/21/12 0604 143/95 mmHg - - 84 18 - -  04/21/12 0513 142/84 mmHg - - 75 18 - -  04/21/12 0504 163/104 mmHg 98.3 F (36.8 C) Oral 91 18 - -  04/21/12 0403 144/97 mmHg - - 90 18 - -    FHT:  FHR: 130 bpm, variability: moderate,  accelerations:  Present,  decelerations:  Absent UC:   irregular, mild SVE:   Dilation: 3-4 Effacement (%): Thick Station: Ballotable Exam by:: L Lamon RN  Labs: Lab Results  Component Value Date   WBC 10.2 04/20/2012   HGB 11.1* 04/20/2012   HCT 33.6* 04/20/2012   MCV 80.0 04/20/2012   PLT 271 04/20/2012    Assessment / Plan: Induction of labor due to preeclampsia,  progressing well on pitocin Severe range BPs  Labor: Progressing normally Preeclampsia:  on magnesium sulfate, no signs or symptoms of toxicity and labs stable Fetal Wellbeing:  Category I Pain Control:  Labor support without medications I/D:  n/a Anticipated MOD:  NSVD BP improved w/ Labetalol 10 mg IV x 1. Dr. Marice Potter notified. Agrees w/ POC.  Start pitocin  Dorathy Kinsman 04/21/2012, 7:51 AM

## 2012-04-21 NOTE — Progress Notes (Signed)
Dr Ardeth Sportsman updated on inability to place 2nd dose of cytotec due to UC pattern.  Orders given to continue to watch and place when able.

## 2012-04-21 NOTE — Progress Notes (Signed)
Patient ID: Alexis Montgomery, female   DOB: Jan 22, 1990, 22 y.o.   MRN: [id number]  Doing well but has headache unrelieved by Tylenol. AVSS with some elevated BPs Filed Vitals:   04/21/12 1432 04/21/12 1502 04/21/12 1533 04/21/12 1602  BP: 145/99 159/109 148/97 159/106  Pulse: 99 90 92 91  Temp: 98.2 F (36.8 C)   97.9 F (36.6 C)  TempSrc: Oral   Oral  Resp: 18 18 18 18   Height:      Weight:       FHR reactive UCs every 2 minutes, mild to moderate, pt starting to hurt a little  Cervix deferred  Will start some Labetalol Will give Fentanyl for headache

## 2012-04-21 NOTE — Progress Notes (Signed)
DAVI ROTAN is a 22 y.o. G1P0000 at [redacted]w[redacted]d by ultrasound admitted for induction of labor due to Pre-eclamptic toxemia of pregnancy..  Subjective:   Objective: BP 145/99  Pulse 99  Temp(Src) 98.2 F (36.8 C) (Oral)  Resp 18  Ht 5\' 3"  (1.6 m)  Wt 224 lb (101.606 kg)  BMI 39.69 kg/m2 I/O last 3 completed shifts: In: 1851.3 [P.O.:145; I.V.:1256.3; IV Piggyback:450] Out: 2200 [Urine:2200] Total I/O In: 787.4 [P.O.:100; I.V.:587.4; IV Piggyback:100] Out: 350 [Urine:350]  FHT:  FHR: 135 bpm, variability: moderate,  accelerations:  Present,  decelerations:  Absent UC:   regular, every 2 minutes SVE:   Dilation: 3.5 Effacement (%): 50 Station: -2 Exam by:: Tressia Danas RN  Labs: Lab Results  Component Value Date   WBC 13.2* 04/21/2012   HGB 10.6* 04/21/2012   HCT 31.5* 04/21/2012   MCV 79.3 04/21/2012   PLT 274 04/21/2012    Assessment / Plan: Induction of labor due to preeclampsia,  progressing well on pitocin  Labor: Progressing on Pitocin, will continue to increase then AROM Preeclampsia:  on magnesium sulfate and no signs or symptoms of toxicity Fetal Wellbeing:  Category I Pain Control:  Epidural I/D:  n/a Anticipated MOD:  NSVD  Georgios Kina 04/21/2012, 2:55 PM

## 2012-04-21 NOTE — Progress Notes (Signed)
Alexis Montgomery is a 22 y.o. G1P0000 at [redacted]w[redacted]d by ultrasound admitted for induction of labor due to Pre-eclamptic toxemia of pregnancy..  Subjective: Doing well. Foley out  Objective: BP 125/70  Pulse 86  Temp(Src) 98.2 F (36.8 C) (Oral)  Resp 18  Ht 5\' 3"  (1.6 m)  Wt 224 lb (101.606 kg)  BMI 39.69 kg/m2 I/O last 3 completed shifts: In: 1851.3 [P.O.:145; I.V.:1256.3; IV Piggyback:450] Out: 2200 [Urine:2200] Total I/O In: 257.1 [I.V.:257.1] Out: 350 [Urine:350]  FHT:  FHR: 145 bpm, variability: moderate,  accelerations:  Present,  decelerations:  Present one variable decel with contraction UC:   irregular, every 1-3 minutes SVE:   Dilation: 3.5 Effacement (%): 50 Station: -2 Exam by:: Tressia Danas RN  Labs: Lab Results  Component Value Date   WBC 13.2* 04/21/2012   HGB 10.6* 04/21/2012   HCT 31.5* 04/21/2012   MCV 79.3 04/21/2012   PLT 274 04/21/2012    Assessment / Plan: Induction of labor due to preeclampsia,  progressing well on pitocin  Labor: Progressing normally and Progressing on Pitocin, will continue to increase then AROM Preeclampsia:  on magnesium sulfate and no signs or symptoms of toxicity Fetal Wellbeing:  Category I Pain Control:  Labor support without medications I/D:  n/a Anticipated MOD:  NSVD  [facility] 04/21/2012, 9:49 AM

## 2012-04-21 NOTE — Progress Notes (Signed)
   Subjective: Pt reports comfort; not feeling contractions.    Objective: BP 154/95  Pulse 87  Temp(Src) 98.2 F (36.8 C) (Oral)  Resp 18  Ht 5\' 3"  (1.6 m)  Wt 101.606 kg (224 lb)  BMI 39.69 kg/m2   Total I/O In: 1026.3 [P.O.:145; I.V.:631.3; IV Piggyback:250] Out: 600 [Urine:600]  FHT:  FHR: 130's bpm, variability: moderate,  accelerations:  Present,  decelerations:  Absent UC:   regular, every 2-3 minutes SVE:   Dilation: 1 Effacement (%): Thick Station: -3 Exam by:: Roney Marion CNM  Labs: Lab Results  Component Value Date   WBC 10.2 04/20/2012   HGB 11.1* 04/20/2012   HCT 33.6* 04/20/2012   MCV 80.0 04/20/2012   PLT 271 04/20/2012    Assessment / Plan: Augmentation of Labor - Preeclampsia  Labor: Augmentation of Labor - Preeclampsia Preeclampsia:  on magnesium sulfate Fetal Wellbeing:  Category I Pain Control:  Labor support without medications I/D:  GBS pos Anticipated MOD:  NSVD Foley bulb placed without difficulty [facility] 04/21/2012, 3:19 AM

## 2012-04-21 NOTE — Progress Notes (Addendum)
Alexis Montgomery is a 22 y.o. G1P0000 at [redacted]w[redacted]d by ultrasound admitted for induction of labor due to Pre-eclamptic toxemia of pregnancy..  Subjective: Still not feeling much pain with contractions  Objective: BP 144/90  Pulse 82  Temp(Src) 98.3 F (36.8 C) (Oral)  Resp 18  Ht 5\' 3"  (1.6 m)  Wt 224 lb (101.606 kg)  BMI 39.69 kg/m2 I/O last 3 completed shifts: In: 4117 [P.O.:725; I.V.:2642; IV Piggyback:750] Out: 3550 [Urine:3450; Emesis/NG output:100]    FHT:  FHR: 135 bpm, variability: moderate,  accelerations:  Present,  decelerations:  Absent UC:   irregular, every 3-4 minutes SVE:   Dilation: 3.5 Effacement (%): 50 Station: -2 Exam by:: Tressia Danas RN (check from this morning)      Labs: Lab Results  Component Value Date   WBC 13.2* 04/21/2012   HGB 10.6* 04/21/2012   HCT 31.5* 04/21/2012   MCV 79.3 04/21/2012   PLT 274 04/21/2012    Assessment / Plan: Induction of labor due to preeclampsia,  progressing well on pitocin  Labor: Prolonged Latent Phase  Since contractions are still irregular and mild, will try turning Pitocin off for 30 minutes and then restart at 10 mu/min.    Preeclampsia:  on magnesium sulfate, no signs or symptoms of toxicity and intake and ouput balanced Fetal Wellbeing:  Category I Pain Control:  Labor support without medications I/D:  n/a Anticipated MOD:  NSVD  Johnedward Brodrick 04/21/2012, 7:30 PM

## 2012-04-21 NOTE — Progress Notes (Signed)
Mathews Robinsons CNM gave orders to turn off pitocin, allow pt to eat a regular diet, and get up to take a shower.  Okay to shower without continuous monitoring.  Place cytotec 2 hours after turning off pitocin.

## 2012-04-21 NOTE — Progress Notes (Signed)
Williams CNM gave orders to stop pitocin for 30 minutes and restart at 10.

## 2012-04-22 DIAGNOSIS — Z3400 Encounter for supervision of normal first pregnancy, unspecified trimester: Secondary | ICD-10-CM | POA: Insufficient documentation

## 2012-04-22 LAB — CBC
HCT: 33.6 % — ABNORMAL LOW (ref 36.0–46.0)
Hemoglobin: 11.3 g/dL — ABNORMAL LOW (ref 12.0–15.0)
MCH: 26.8 pg (ref 26.0–34.0)
MCV: 79.6 fL (ref 78.0–100.0)
RBC: 4.22 MIL/uL (ref 3.87–5.11)

## 2012-04-22 LAB — TYPE AND SCREEN: ABO/RH(D): A POS

## 2012-04-22 LAB — COMPREHENSIVE METABOLIC PANEL
BUN: 6 mg/dL (ref 6–23)
CO2: 23 mEq/L (ref 19–32)
Calcium: 7.6 mg/dL — ABNORMAL LOW (ref 8.4–10.5)
Creatinine, Ser: 0.68 mg/dL (ref 0.50–1.10)
GFR calc Af Amer: >90 mL/min (ref >90)
GFR calc non Af Amer: >90 mL/min (ref >90)
Glucose, Bld: 75 mg/dL (ref 70–99)

## 2012-04-22 MED ORDER — LABETALOL HCL 5 MG/ML IV SOLN
20.0000 mg | Freq: Once | INTRAVENOUS | Status: AC
  Administered 2012-04-22: 20 mg via INTRAVENOUS

## 2012-04-22 MED ORDER — EPHEDRINE 5 MG/ML INJ
10.0000 mg | INTRAVENOUS | Status: DC | PRN
  Administered 2012-04-22: 10 mg via INTRAVENOUS

## 2012-04-22 MED ORDER — LACTATED RINGERS IV SOLN
500.0000 mL | Freq: Once | INTRAVENOUS | Status: AC
  Administered 2012-04-22: 500 mL via INTRAVENOUS

## 2012-04-22 MED ORDER — TERBUTALINE SULFATE 1 MG/ML IJ SOLN: 0.2500 mg | Freq: Once | INTRAMUSCULAR | Status: AC | PRN

## 2012-04-22 MED ORDER — EPHEDRINE 5 MG/ML INJ
10.0000 mg | INTRAVENOUS | Status: DC | PRN
  Filled 2012-04-22: qty 4

## 2012-04-22 MED ORDER — PHENYLEPHRINE 40 MCG/ML (10ML) SYRINGE FOR IV PUSH (FOR BLOOD PRESSURE SUPPORT)
80.0000 ug | PREFILLED_SYRINGE | INTRAVENOUS | Status: DC | PRN
  Filled 2012-04-22: qty 5

## 2012-04-22 MED ORDER — DIPHENHYDRAMINE HCL 50 MG/ML IJ SOLN: 12.5000 mg | INTRAMUSCULAR | Status: DC | PRN

## 2012-04-22 MED ORDER — FENTANYL 2.5 MCG/ML BUPIVACAINE 1/10 % EPIDURAL INFUSION (WH - ANES)
14.0000 mL/h | INTRAMUSCULAR | Status: DC | PRN
  Administered 2012-04-22 – 2012-04-23 (×2): 14 mL/h via EPIDURAL
  Filled 2012-04-22 (×2): qty 125

## 2012-04-22 MED ORDER — PHENYLEPHRINE 40 MCG/ML (10ML) SYRINGE FOR IV PUSH (FOR BLOOD PRESSURE SUPPORT)
80.0000 ug | PREFILLED_SYRINGE | INTRAVENOUS | Status: DC | PRN
  Administered 2012-04-22: 80 ug via INTRAVENOUS

## 2012-04-22 MED ORDER — SODIUM BICARBONATE 8.4 % IV SOLN
INTRAVENOUS | Status: DC | PRN
  Administered 2012-04-22: 5 mL via EPIDURAL

## 2012-04-22 NOTE — Progress Notes (Signed)
   GENEVIEVE ARBAUGH is a 22 y.o. G1P0000 at [redacted]w[redacted]d  admitted for induction of labor due to Pre-eclamptic toxemia of pregnancy..  Subjective:  Comfortable with epidural Objective: BP 126/79  Pulse 81  Temp(Src) 98.2 F (36.8 C) (Oral)  Resp 18  Ht 5\' 3"  (1.6 m)  Wt 224 lb (101.606 kg)  BMI 39.69 kg/m2 Total I/O In: 1920.6 [P.O.:690; I.V.:1030.6; IV Piggyback:200] Out: 1250 [Urine:1250]  FHT:  FHR: 140 bpm, variability: minimal ,  accelerations:  Abscent,  decelerations:  Present Had a prolonged variable most likely 2/2 maternal hypotension.  Also had several late decels while supine for internal monitor placement.   UC:   IUPC placed; MVU's , ctx q 1 minute. SVE:   4/80/-3/molding Pitocin @ 28 mu/min  Labs: Lab Results  Component Value Date   WBC 10.8* 04/22/2012   HGB 11.3* 04/22/2012   HCT 33.6* 04/22/2012   MCV 79.6 04/22/2012   PLT 281 04/22/2012    Assessment / Plan: IOL for Preeclampsia; beginning active labor Pitocin decreased to 24 mu/min; turned on RIght side, 02 at 12L/min Labor: slow progress Fetal Wellbeing:  Category II Pain Control:  Epidural Anticipated MOD:  NSVD  CRESENZO-DISHMAN,Abagale Boulos 04/22/2012, 6:21 PM

## 2012-04-22 NOTE — Anesthesia Procedure Notes (Signed)

## 2012-04-22 NOTE — Progress Notes (Signed)
Alexis Montgomery is a 22 y.o. G1P0000 at [redacted]w[redacted]d by ultrasound admitted for induction of labor due to Pre-eclamptic toxemia of pregnancy.  Subjective: Denies HA or vision changes. Comfortable.  Objective: BP 154/99  Pulse 81  Temp(Src) 98.6 F (37 C) (Oral)  Resp 18  Ht 5\' 3"  (1.6 m)  Wt 224 lb (101.606 kg)  BMI 39.69 kg/m2 I/O last 3 completed shifts: In: 6597.7 [P.O.:1305; I.V.:4242.7; IV Piggyback:1050] Out: 5450 [Urine:5350; Emesis/NG output:100] Total I/O In: 225 [P.O.:100; I.V.:125] Out: 0   FHT:  FHR: 130s bpm, variability: moderate,  accelerations:  Present,  decelerations:  Absent UC:   none SVE:   Dilation: 4 Effacement (%): 70 Station: -3;-2 Exam by:: L.McDaniel Rn   Labs: Lab Results  Component Value Date   WBC 13.2* 04/21/2012   HGB 10.6* 04/21/2012   HCT 31.5* 04/21/2012   MCV 79.3 04/21/2012   PLT 274 04/21/2012    Assessment / Plan: Induction of labor due to preeclampsia  Labor: Prolonged Latent Phase. S/p two additional cytotec doses. Will now start low dose pit Preeclampsia:  on magnesium sulfate and no signs or symptoms of toxicity, continue to monitor BP closely and give IV labetalol as needed Fetal Wellbeing:  Category I Pain Control:  Labor support without medications I/D:  GBS positive, on penicillin Anticipated MOD:  NSVD  Levert Feinstein 04/22/2012, 8:41 AM

## 2012-04-22 NOTE — Anesthesia Preprocedure Evaluation (Signed)
Anesthesia Evaluation  Patient identified by MRN, date of birth, ID band Patient awake    Reviewed: Allergy & Precautions, H&P , Patient's Chart, lab work & pertinent test results  Airway Mallampati: III TM Distance: >3 FB Neck ROM: full    Dental  (+) Teeth Intact   Pulmonary  breath sounds clear to auscultation        Cardiovascular hypertension, On Medications Rhythm:regular Rate:Normal     Neuro/Psych    GI/Hepatic   Endo/Other  Morbid obesity  Renal/GU      Musculoskeletal   Abdominal   Peds  Hematology   Anesthesia Other Findings   Pregnancy induced hypertension     On Mg+2, and labatolol     Reproductive/Obstetrics (+) Pregnancy                           Anesthesia Physical Anesthesia Plan  ASA: III  Anesthesia Plan: Epidural   Post-op Pain Management:    Induction:   Airway Management Planned:   Additional Equipment:   Intra-op Plan:   Post-operative Plan:   Informed Consent: I have reviewed the patients History and Physical, chart, labs and discussed the procedure including the risks, benefits and alternatives for the proposed anesthesia with the patient or authorized representative who has indicated his/her understanding and acceptance.   Dental Advisory Given  Plan Discussed with:   Anesthesia Plan Comments: (Labs checked- platelets confirmed with RN in room. Fetal heart tracing, per RN, reported to be stable enough for sitting procedure. Discussed epidural, and patient consents to the procedure:  included risk of possible headache,backache, failed block, allergic reaction, and nerve injury. This patient was asked if she had any questions or concerns before the procedure started. )        Anesthesia Quick Evaluation

## 2012-04-22 NOTE — Progress Notes (Signed)
Patient ID: KAYANA THOEN, female   DOB: 26-Mar-1990, 22 y.o.   MRN: [id number]  S:  Pt not feeling contractions  O:   Filed Vitals:   04/22/12 1102 04/22/12 1103 04/22/12 1133 04/22/12 1202  BP: 141/101 141/101 136/100 138/102  Pulse: 80 80 87 87  Temp:   98.3 F (36.8 C)   TempSrc:   Oral   Resp: 18 18 16 16   Height:      Weight:        Cervix:  3-4/50/-3 (ballotable) FHTs:   130, moderate variability, accels present, no decels TOCO:  Irregular contractions  A/P 22 y.o. G1P0000 at [redacted]w[redacted]d here for IOL for severe preeclampsia - BP fair control with labetalol - On magnesium - No progress in dilation, not in labor - Continue to increase pitocin, trial of nipple stimulation, not able to AROM yet - FHT category I  Napoleon Form, MD

## 2012-04-22 NOTE — Progress Notes (Signed)
Alexis Montgomery is a 22 y.o. G1P0000 at [redacted]w[redacted]d  admitted for induction of labor due to Pre-eclamptic toxemia of pregnancy.  Subjective: Comfortable with epidural, discomfort well controlled.   Objective: BP 150/91  Pulse 84  Temp(Src) 98.5 F (36.9 C) (Axillary)  Resp 18  Ht 5\' 3"  (1.6 m)  Wt 101.606 kg (224 lb)  BMI 39.69 kg/m2 Total I/O In: 173 [P.O.:50; I.V.:123] Out: 0   FHT:  FHR: 130 bpm, variability: moderate,  accelerations:  Present,  decelerations:  Absent; no recurrent late decels.  UC:   IUPC placed; MVU's , ctx q 2-4 minute. SVE:   Dilation: 5 Effacement (%): 80 Cervical Position: Posterior Station: -2 Presentation: Vertex Exam by:: Cres-Dishmon, CNM/ Dr. Berline Chough Pitocin @ 24 mu/min  Labs: Lab Results  Component Value Date   WBC 10.8* 04/22/2012   HGB 11.3* 04/22/2012   HCT 33.6* 04/22/2012   MCV 79.6 04/22/2012   PLT 281 04/22/2012    Assessment / Plan: IOL for Preeclampsia; beginning active labor Labor: slow progress Fetal Wellbeing:  Category II Pain Control:  Epidural Anticipated MOD:  NSVD  Andrena Mews, DO [facility] Resident - PGY-2 04/22/2012 10:00 PM

## 2012-04-22 NOTE — Progress Notes (Signed)
Patient ID: Alexis Montgomery, female   DOB: 1990/12/25, 22 y.o.   MRN: [id number]  S:  Pt very frustrated at lack of progress. Still not feeling ctx.  O: Filed Vitals:   04/22/12 1232 04/22/12 1236 04/22/12 1302 04/22/12 1332  BP: 180/157 150/94 142/99 163/105  Pulse: 95 85 86 87  Temp:      TempSrc:      Resp: 18 18 18 18   Height:      Weight:        Cervix: 3-4/50/-2 AROM:  Clear, large amt of fluid  FHTs:  140s/moderate variability, accels present, no decels CTX;  Irregular  A/P Continue pitocin Continue labetalol po, iv prn Anticipate better progress  Napoleon Form, MD

## 2012-04-22 NOTE — Progress Notes (Signed)
Alexis Montgomery is a 22 y.o. G1P0000 at [redacted]w[redacted]d by ultrasound admitted for induction of labor due to Pre-eclamptic toxemia of pregnancy.  Subjective: Sleeping comfortably, did not awaken  Objective: BP 126/77  Pulse 77  Temp(Src) 98.1 F (36.7 C) (Oral)  Resp 18  Ht 5\' 3"  (1.6 m)  Wt 101.606 kg (224 lb)  BMI 39.69 kg/m2 I/O last 3 completed shifts: In: 4361 [P.O.:845; I.V.:2766; IV Piggyback:750] Out: 3550 [Urine:3450; Emesis/NG output:100] Total I/O In: 1161.7 [P.O.:460; I.V.:601.7; IV Piggyback:100] Out: 550 [Urine:550]  FHT:  FHR: 120 bpm, variability: moderate,  accelerations:  Present,  decelerations:  Absent UC:   none SVE:   Dilation: 3.5 Effacement (%): 40 Station: -2;Ballotable Exam by:: L Lamon RN  Lungs: clear to auscultation bilaterally  Labs: Lab Results  Component Value Date   WBC 13.2* 04/21/2012   HGB 10.6* 04/21/2012   HCT 31.5* 04/21/2012   MCV 79.3 04/21/2012   PLT 274 04/21/2012    Assessment / Plan: Induction of labor due to preeclampsia,  progressing well on pitocin  Labor: Prolonged Latent Phase. Since cervix not changing on pitocin, have tried another dose of cytotec Preeclampsia:  on magnesium sulfate and no signs or symptoms of toxicity, blood pressure better controlled s/p IV labetalol Fetal Wellbeing:  Category I Pain Control:  Labor support without medications I/D:  GBS positive, on penicillin Anticipated MOD:  NSVD  Levert Feinstein 04/22/2012, 1:25 AM

## 2012-04-22 NOTE — Progress Notes (Signed)
Alexis Montgomery is a 22 y.o. G1P0000 at [redacted]w[redacted]d  admitted for induction of labor due to Pre-eclamptic toxemia of pregnancy.  Subjective: Comfortable with epidural, talking on phone, family present in room  Objective: BP 150/100  Pulse 86  Temp(Src) 98.5 F (36.9 C) (Axillary)  Resp 18  Ht 5\' 3"  (1.6 m)  Wt 101.606 kg (224 lb)  BMI 39.69 kg/m2 Total I/O In: 173 [P.O.:50; I.V.:123] Out: 0   FHT:  FHR: 140 bpm, variability: moderate,  accelerations:  Present,  decelerations:  Absent currently.  Had 3 Late decels ~30 minutes ago resolved with repositioning and O2. UC:   IUPC placed; MVU's , ctx q 2-4 minute. SVE:   Dilation: 5 Effacement (%): 80 Cervical Position: Posterior Station: -2 Presentation: Vertex Exam by:: Cres-Dishmon, CNM/ Dr. Berline Chough Pitocin @ 24 mu/min  Labs: Lab Results  Component Value Date   WBC 10.8* 04/22/2012   HGB 11.3* 04/22/2012   HCT 33.6* 04/22/2012   MCV 79.6 04/22/2012   PLT 281 04/22/2012    Assessment / Plan: IOL for Preeclampsia; beginning active labor Improved fetal tracing.  Continue to titrate Pitocin.  Slow but steady progress. Labor: slow progress Fetal Wellbeing:  Category II Pain Control:  Epidural Anticipated MOD:  NSVD  Andrena Mews, DO [facility] Resident - PGY-2 04/22/2012 8:26 PM

## 2012-04-22 NOTE — Progress Notes (Signed)
Pt at bedside crying and upset over process of induction. Becoming frustrated with lack of progress. Rn at bedside providing encouragement and education of situation and POC. Giving patient time to consider options after SVE.

## 2012-04-22 NOTE — Progress Notes (Signed)
Pt continues to express concerns with POC and is wanting a C/S unless POC has other options. Dr Thad Ranger and Dr Macon Large notified of pt frustration and on way to assess patient.

## 2012-04-23 ENCOUNTER — Encounter (HOSPITAL_COMMUNITY): Payer: Self-pay | Admitting: Anesthesiology

## 2012-04-23 ENCOUNTER — Inpatient Hospital Stay (HOSPITAL_COMMUNITY): Payer: Medicaid Other | Admitting: Anesthesiology

## 2012-04-23 ENCOUNTER — Encounter (HOSPITAL_COMMUNITY)
Admission: AD | Disposition: A | Discharge status: Home / Self Care | Payer: Self-pay | Source: Ambulatory Visit | Attending: Obstetrics and Gynecology

## 2012-04-23 DIAGNOSIS — IMO0002 Reserved for concepts with insufficient information to code with codable children: Secondary | ICD-10-CM

## 2012-04-23 DIAGNOSIS — O1490 Unspecified pre-eclampsia, unspecified trimester: Secondary | ICD-10-CM

## 2012-04-23 DIAGNOSIS — O99892 Other specified diseases and conditions complicating childbirth: Secondary | ICD-10-CM

## 2012-04-23 DIAGNOSIS — O9989 Other specified diseases and conditions complicating pregnancy, childbirth and the puerperium: Secondary | ICD-10-CM

## 2012-04-23 LAB — CBC
Hemoglobin: 9.4 g/dL — ABNORMAL LOW (ref 12.0–15.0)
MCH: 26.6 pg (ref 26.0–34.0)
MCV: 79.9 fL (ref 78.0–100.0)
RBC: 3.54 MIL/uL — ABNORMAL LOW (ref 3.87–5.11)

## 2012-04-23 SURGERY — Surgical Case
Anesthesia: Epidural | Site: Abdomen | Wound class: Clean Contaminated

## 2012-04-23 MED ORDER — METOCLOPRAMIDE HCL 5 MG/ML IJ SOLN
INTRAMUSCULAR | Status: AC
  Filled 2012-04-23: qty 2

## 2012-04-23 MED ORDER — MEPERIDINE HCL 25 MG/ML IJ SOLN: 6.2500 mg | INTRAMUSCULAR | Status: DC | PRN

## 2012-04-23 MED ORDER — PRENATAL MULTIVITAMIN CH
1.0000 | ORAL_TABLET | Freq: Every day | ORAL | Status: DC
Start: 2012-04-23 — End: 2012-04-26
  Administered 2012-04-23 – 2012-04-26 (×4): 1 via ORAL
  Filled 2012-04-23 (×5): qty 1

## 2012-04-23 MED ORDER — LABETALOL HCL 5 MG/ML IV SOLN: 20.0000 mg | INTRAVENOUS | Status: DC | PRN

## 2012-04-23 MED ORDER — CEFAZOLIN SODIUM-DEXTROSE 2-3 GM-% IV SOLR
INTRAVENOUS | Status: DC | PRN
  Administered 2012-04-23: 2 g via INTRAVENOUS

## 2012-04-23 MED ORDER — DIPHENHYDRAMINE HCL 50 MG/ML IJ SOLN: 12.5000 mg | INTRAMUSCULAR | Status: DC | PRN

## 2012-04-23 MED ORDER — IBUPROFEN 600 MG PO TABS
600.0000 mg | ORAL_TABLET | Freq: Four times a day (QID) | ORAL | Status: DC
  Administered 2012-04-23 – 2012-04-26 (×13): 600 mg via ORAL
  Filled 2012-04-23 (×11): qty 1

## 2012-04-23 MED ORDER — BUPIVACAINE HCL (PF) 0.25 % IJ SOLN
INTRAMUSCULAR | Status: AC
  Filled 2012-04-23: qty 30

## 2012-04-23 MED ORDER — CEFAZOLIN SODIUM-DEXTROSE 2-3 GM-% IV SOLR
2.0000 g | Freq: Once | INTRAVENOUS | Status: DC
  Filled 2012-04-23: qty 50

## 2012-04-23 MED ORDER — KETOROLAC TROMETHAMINE 30 MG/ML IJ SOLN: 30.0000 mg | Freq: Four times a day (QID) | INTRAMUSCULAR | Status: AC | PRN

## 2012-04-23 MED ORDER — WITCH HAZEL-GLYCERIN EX PADS: 1.0000 "application " | MEDICATED_PAD | CUTANEOUS | Status: DC | PRN

## 2012-04-23 MED ORDER — ONDANSETRON HCL 4 MG/2ML IJ SOLN
4.0000 mg | INTRAMUSCULAR | Status: DC | PRN
  Administered 2012-04-23: 4 mg via INTRAVENOUS

## 2012-04-23 MED ORDER — LACTATED RINGERS IV SOLN
INTRAVENOUS | Status: DC | PRN
  Administered 2012-04-23: 09:00:00 via INTRAVENOUS

## 2012-04-23 MED ORDER — METOCLOPRAMIDE HCL 5 MG/ML IJ SOLN: 10.0000 mg | Freq: Three times a day (TID) | INTRAMUSCULAR | Status: DC | PRN

## 2012-04-23 MED ORDER — SIMETHICONE 80 MG PO CHEW: 80.0000 mg | CHEWABLE_TABLET | ORAL | Status: DC | PRN

## 2012-04-23 MED ORDER — OXYTOCIN 10 UNIT/ML IJ SOLN
40.0000 [IU] | INTRAVENOUS | Status: DC | PRN
  Administered 2012-04-23: 40 [IU] via INTRAVENOUS

## 2012-04-23 MED ORDER — LABETALOL HCL 5 MG/ML IV SOLN
20.0000 mg | INTRAVENOUS | Status: DC | PRN
  Filled 2012-04-23: qty 4

## 2012-04-23 MED ORDER — METOCLOPRAMIDE HCL 5 MG/ML IJ SOLN
INTRAMUSCULAR | Status: DC | PRN
  Administered 2012-04-23: 5 mg via INTRAVENOUS

## 2012-04-23 MED ORDER — NALBUPHINE HCL 10 MG/ML IJ SOLN
5.0000 mg | INTRAMUSCULAR | Status: DC | PRN
Start: 2012-04-23 — End: 2012-04-26
  Filled 2012-04-23: qty 1

## 2012-04-23 MED ORDER — MORPHINE SULFATE (PF) 0.5 MG/ML IJ SOLN
INTRAMUSCULAR | Status: DC | PRN
  Administered 2012-04-23: 4 mg via EPIDURAL

## 2012-04-23 MED ORDER — DIBUCAINE 1 % RE OINT: 1.0000 "application " | TOPICAL_OINTMENT | RECTAL | Status: DC | PRN

## 2012-04-23 MED ORDER — SODIUM CHLORIDE 0.9 % IJ SOLN: 3.0000 mL | INTRAMUSCULAR | Status: DC | PRN

## 2012-04-23 MED ORDER — SENNOSIDES-DOCUSATE SODIUM 8.6-50 MG PO TABS
2.0000 | ORAL_TABLET | Freq: Every day | ORAL | Status: DC
  Administered 2012-04-24 – 2012-04-25 (×2): 2 via ORAL

## 2012-04-23 MED ORDER — FENTANYL CITRATE 0.05 MG/ML IJ SOLN: 25.0000 ug | INTRAMUSCULAR | Status: DC | PRN

## 2012-04-23 MED ORDER — LACTATED RINGERS IV BOLUS (SEPSIS): 500.0000 mL | Freq: Once | INTRAVENOUS | Status: DC

## 2012-04-23 MED ORDER — MAGNESIUM SULFATE 40 G IN LACTATED RINGERS - SIMPLE
2.0000 g/h | INTRAVENOUS | Status: AC
  Administered 2012-04-23: 2 g/h via INTRAVENOUS
  Filled 2012-04-23: qty 500

## 2012-04-23 MED ORDER — ZOLPIDEM TARTRATE 5 MG PO TABS: 5.0000 mg | ORAL_TABLET | Freq: Every evening | ORAL | Status: DC | PRN

## 2012-04-23 MED ORDER — LACTATED RINGERS IV SOLN
INTRAVENOUS | Status: DC
  Administered 2012-04-23 – 2012-04-24 (×2): via INTRAVENOUS

## 2012-04-23 MED ORDER — BUPIVACAINE HCL (PF) 0.5 % IJ SOLN
INTRAMUSCULAR | Status: DC | PRN
  Administered 2012-04-23: 30 mL

## 2012-04-23 MED ORDER — OXYTOCIN 10 UNIT/ML IJ SOLN
INTRAMUSCULAR | Status: AC
  Filled 2012-04-23: qty 4

## 2012-04-23 MED ORDER — DIPHENHYDRAMINE HCL 25 MG PO CAPS: 25.0000 mg | ORAL_CAPSULE | Freq: Four times a day (QID) | ORAL | Status: DC | PRN

## 2012-04-23 MED ORDER — MAGNESIUM HYDROXIDE 400 MG/5ML PO SUSP
30.0000 mL | ORAL | Status: DC | PRN
  Filled 2012-04-23: qty 30

## 2012-04-23 MED ORDER — BUPIVACAINE HCL (PF) 0.5 % IJ SOLN
INTRAMUSCULAR | Status: AC
  Filled 2012-04-23: qty 30

## 2012-04-23 MED ORDER — SCOPOLAMINE 1 MG/3DAYS TD PT72
1.0000 | MEDICATED_PATCH | Freq: Once | TRANSDERMAL | Status: DC
  Administered 2012-04-23: 1.5 mg via TRANSDERMAL
  Filled 2012-04-23: qty 1

## 2012-04-23 MED ORDER — KETOROLAC TROMETHAMINE 60 MG/2ML IM SOLN: 60.0000 mg | Freq: Once | INTRAMUSCULAR | Status: AC | PRN

## 2012-04-23 MED ORDER — ONDANSETRON HCL 4 MG/2ML IJ SOLN
4.0000 mg | Freq: Three times a day (TID) | INTRAMUSCULAR | Status: DC | PRN
  Filled 2012-04-23: qty 2

## 2012-04-23 MED ORDER — MENTHOL 3 MG MT LOZG: 1.0000 | LOZENGE | OROMUCOSAL | Status: DC | PRN

## 2012-04-23 MED ORDER — NALOXONE HCL 1 MG/ML IJ SOLN
1.0000 ug/kg/h | INTRAVENOUS | Status: DC | PRN
  Filled 2012-04-23: qty 2

## 2012-04-23 MED ORDER — PHENYLEPHRINE 40 MCG/ML (10ML) SYRINGE FOR IV PUSH (FOR BLOOD PRESSURE SUPPORT)
PREFILLED_SYRINGE | INTRAVENOUS | Status: AC
  Filled 2012-04-23: qty 5

## 2012-04-23 MED ORDER — MORPHINE SULFATE 0.5 MG/ML IJ SOLN
INTRAMUSCULAR | Status: AC
  Filled 2012-04-23: qty 10

## 2012-04-23 MED ORDER — NALOXONE HCL 0.4 MG/ML IJ SOLN: 0.4000 mg | INTRAMUSCULAR | Status: DC | PRN

## 2012-04-23 MED ORDER — ONDANSETRON HCL 4 MG/2ML IJ SOLN
INTRAMUSCULAR | Status: AC
  Filled 2012-04-23: qty 2

## 2012-04-23 MED ORDER — SODIUM BICARBONATE 8.4 % IV SOLN
INTRAVENOUS | Status: AC
  Filled 2012-04-23: qty 50

## 2012-04-23 MED ORDER — LANOLIN HYDROUS EX OINT: 1.0000 "application " | TOPICAL_OINTMENT | CUTANEOUS | Status: DC | PRN

## 2012-04-23 MED ORDER — ONDANSETRON HCL 4 MG PO TABS: 4.0000 mg | ORAL_TABLET | ORAL | Status: DC | PRN

## 2012-04-23 MED ORDER — OXYCODONE-ACETAMINOPHEN 5-325 MG PO TABS
1.0000 | ORAL_TABLET | ORAL | Status: DC | PRN
  Administered 2012-04-24 – 2012-04-25 (×3): 1 via ORAL
  Administered 2012-04-25: 2 via ORAL
  Administered 2012-04-25 – 2012-04-26 (×2): 1 via ORAL
  Filled 2012-04-23 (×3): qty 1
  Filled 2012-04-23: qty 2
  Filled 2012-04-23: qty 1

## 2012-04-23 MED ORDER — DIPHENHYDRAMINE HCL 50 MG/ML IJ SOLN: 25.0000 mg | INTRAMUSCULAR | Status: DC | PRN

## 2012-04-23 MED ORDER — TETANUS-DIPHTH-ACELL PERTUSSIS 5-2.5-18.5 LF-MCG/0.5 IM SUSP
0.5000 mL | Freq: Once | INTRAMUSCULAR | Status: AC
  Administered 2012-04-24: 0.5 mL via INTRAMUSCULAR
  Filled 2012-04-23: qty 0.5

## 2012-04-23 MED ORDER — OXYTOCIN 40 UNITS IN LACTATED RINGERS INFUSION - SIMPLE MED: 125.0000 mL/h | INTRAVENOUS | Status: AC

## 2012-04-23 MED ORDER — ONDANSETRON HCL 4 MG/2ML IJ SOLN
INTRAMUSCULAR | Status: DC | PRN
  Administered 2012-04-23: 4 mg via INTRAVENOUS

## 2012-04-23 MED ORDER — PHENYLEPHRINE HCL 10 MG/ML IJ SOLN
INTRAMUSCULAR | Status: DC | PRN
  Administered 2012-04-23: 40 ug via INTRAVENOUS
  Administered 2012-04-23 (×2): 80 ug via INTRAVENOUS

## 2012-04-23 MED ORDER — ACETAMINOPHEN 10 MG/ML IV SOLN
1000.0000 mg | Freq: Four times a day (QID) | INTRAVENOUS | Status: AC | PRN
  Filled 2012-04-23: qty 100

## 2012-04-23 MED ORDER — LIDOCAINE-EPINEPHRINE (PF) 2 %-1:200000 IJ SOLN
INTRAMUSCULAR | Status: AC
  Filled 2012-04-23: qty 20

## 2012-04-23 MED ORDER — DIPHENHYDRAMINE HCL 25 MG PO CAPS
25.0000 mg | ORAL_CAPSULE | ORAL | Status: DC | PRN
  Filled 2012-04-23: qty 1

## 2012-04-23 SURGICAL SUPPLY — 34 items
CLOTH BEACON ORANGE TIMEOUT ST (SAFETY) ×2 IMPLANT
DRAPE LG THREE QUARTER DISP (DRAPES) ×2 IMPLANT
DRSG OPSITE POSTOP 4X10 (GAUZE/BANDAGES/DRESSINGS) ×2 IMPLANT
DURAPREP 26ML APPLICATOR (WOUND CARE) ×2 IMPLANT
ELECT REM PT RETURN 9FT ADLT (ELECTROSURGICAL) ×2
ELECTRODE REM PT RTRN 9FT ADLT (ELECTROSURGICAL) ×1 IMPLANT
EXTRACTOR VACUUM M CUP 4 TUBE (SUCTIONS) IMPLANT
GAUZE SPONGE 4X4 12PLY STRL LF (GAUZE/BANDAGES/DRESSINGS) ×1 IMPLANT
GLOVE BIO SURGEON STRL SZ7 (GLOVE) ×2 IMPLANT
GLOVE BIOGEL PI IND STRL 7.0 (GLOVE) ×1 IMPLANT
GLOVE BIOGEL PI INDICATOR 7.0 (GLOVE) ×1
GOWN STRL REIN XL XLG (GOWN DISPOSABLE) ×4 IMPLANT
KIT ABG SYR 3ML LUER SLIP (SYRINGE) IMPLANT
NDL HYPO 25X5/8 SAFETYGLIDE (NEEDLE) ×1 IMPLANT
NEEDLE HYPO 22GX1.5 SAFETY (NEEDLE) ×2 IMPLANT
NEEDLE HYPO 25X5/8 SAFETYGLIDE (NEEDLE) ×2 IMPLANT
NS IRRIG 1000ML POUR BTL (IV SOLUTION) ×2 IMPLANT
PACK C SECTION WH (CUSTOM PROCEDURE TRAY) ×2 IMPLANT
PAD ABD 7.5X8 STRL (GAUZE/BANDAGES/DRESSINGS) ×1 IMPLANT
PAD OB MATERNITY 4.3X12.25 (PERSONAL CARE ITEMS) ×2 IMPLANT
RTRCTR C-SECT PINK 25CM LRG (MISCELLANEOUS) ×1 IMPLANT
SLEEVE SCD COMPRESS KNEE LRG (MISCELLANEOUS) IMPLANT
SLEEVE SCD COMPRESS KNEE MED (MISCELLANEOUS) IMPLANT
STAPLER VISISTAT 35W (STAPLE) IMPLANT
SUT PDS AB 0 CT1 27 (SUTURE) IMPLANT
SUT PDS AB 0 CTX 36 PDP370T (SUTURE) IMPLANT
SUT VIC AB 0 CT1 36 (SUTURE) ×4 IMPLANT
SUT VIC AB 0 CTX 36 (SUTURE) ×4
SUT VIC AB 0 CTX36XBRD ANBCTRL (SUTURE) ×2 IMPLANT
SUT VIC AB 4-0 KS 27 (SUTURE) ×2 IMPLANT
SYR CONTROL 10ML LL (SYRINGE) ×2 IMPLANT
TOWEL OR 17X24 6PK STRL BLUE (TOWEL DISPOSABLE) ×6 IMPLANT
TRAY FOLEY CATH 14FR (SET/KITS/TRAYS/PACK) ×2 IMPLANT
WATER STERILE IRR 1000ML POUR (IV SOLUTION) ×2 IMPLANT

## 2012-04-23 NOTE — Progress Notes (Signed)
Dr. Macon Large notified of minimal variability, negative scalp stim for 30 minutes.  WIll d/c pit with hopes of baby recovering in utero. Will restart pitocin after 30 minutes of reassuring EFM.

## 2012-04-23 NOTE — Transfer of Care (Signed)
Immediate Anesthesia Transfer of Care Note  Patient: Alexis Montgomery  Procedure(s) Performed: Procedure(s): CESAREAN SECTION (N/A)  Patient Location: PACU  Anesthesia Type:Epidural  Level of Consciousness: awake  Airway & Oxygen Therapy: Patient Spontanous Breathing  Post-op Assessment: Report given to PACU RN  Post vital signs: Reviewed and stable  Complications: No apparent anesthesia complications

## 2012-04-23 NOTE — Progress Notes (Signed)
Faculty Practice OB/GYN Attending Note  Patient has prolonged active phase of labor.  Recommended cesarean section. The risks of cesarean section discussed with the patient included but were not limited to: bleeding which may require transfusion or reoperation; infection which may require antibiotics; injury to bowel, bladder, ureters or other surrounding organs; injury to the fetus; need for additional procedures including hysterectomy in the event of a life-threatening hemorrhage; placental abnormalities wth subsequent pregnancies, incisional problems, thromboembolic phenomenon and other postoperative/anesthesia complications. The patient concurred with the proposed plan, giving informed written consent for the procedure. Anesthesia and OR aware. Preoperative prophylactic antibiotics and SCDs ordered on call to the OR.  To OR when ready.   Jaynie Collins, MD, FACOG Attending Obstetrician & Gynecologist Faculty Practice, [facility]

## 2012-04-23 NOTE — Progress Notes (Signed)
Alexis Montgomery is a 22 y.o. G1P0000 at [redacted]w[redacted]d  admitted for induction of labor due to Pre-eclamptic toxemia of pregnancy.  Subjective: Comfortable with epidural, discomfort well controlled.   Objective: BP 135/83  Pulse 86  Temp(Src) 98.1 F (36.7 C) (Oral)  Resp 18  Ht 5\' 3"  (1.6 m)  Wt 101.606 kg (224 lb)  BMI 39.69 kg/m2 Total I/O In: 1007.1 [P.O.:288; I.V.:619.1; IV Piggyback:100] Out: 700 [Urine:700]  FHT:  FHR: 140 bpm, variability: moderate,  accelerations:  Present,  decelerations:  Absent currently; late decels corrected with repositioning UC:   IUPC placed; MVU's 220-259mmHg, ctx q 4 minute. SVE:   Dilation: 6 Effacement (%): 80 Cervical Position: Posterior Station: -2 Presentation: Vertex Exam by:: Dr Berline Chough Pitocin @ 24 mu/min  Labs: Lab Results  Component Value Date   WBC 10.8* 04/22/2012   HGB 11.3* 04/22/2012   HCT 33.6* 04/22/2012   MCV 79.6 04/22/2012   PLT 281 04/22/2012    Assessment / Plan: IOL for Preeclampsia; beginning active labor Labor: slow progress Fetal Wellbeing:  Category II Pain Control:  Epidural Anticipated MOD:  NSVD  Andrena Mews, DO [facility] Resident - PGY-2 04/23/2012 1:02 AM

## 2012-04-23 NOTE — Progress Notes (Signed)
Dr Berline Chough reviewed strip in department.  No new orders given.

## 2012-04-23 NOTE — Progress Notes (Signed)
Cresenzo-Dishmon CNM at bedside.  Orders given to stop pitocin.  May restart when 30 min of reactive tracing is present.

## 2012-04-23 NOTE — Progress Notes (Signed)
Alexis Montgomery is a 22 y.o. G1P0000 at [redacted]w[redacted]d  admitted for induction of labor due to Pre-eclamptic toxemia of pregnancy.  Subjective: No new complaints  Objective: BP 138/80  Pulse 78  Temp(Src) 98.7 F (37.1 C) (Oral)  Resp 18  Ht 5\' 3"  (1.6 m)  Wt 101.606 kg (224 lb)  BMI 39.69 kg/m2    FHT:  FHR: 140 bpm, variability: minimal ,  accelerations:  Present,  decelerations:  Absent currently; late decels corrected with repositioning and ozygen UC:   IUPC placed; MVU's 0-180 mmHg, ctx q 3-4 minute. SVE:   Dilation: 6.5 Effacement (%): 80 Cervical Position: Posterior Station: -2 Presentation: Vertex Exam by:: Dr. Berline Chough   Labs: Lab Results  Component Value Date   WBC 10.8* 04/22/2012   HGB 11.3* 04/22/2012   HCT 33.6* 04/22/2012   MCV 79.6 04/22/2012   PLT 281 04/22/2012    Assessment / Plan: IOL for Pre-ecclampsia.  Failure to progress; prolonged active phase Labor: prolonged active phase, intolerant to Pitocin, had 30 min break; persisent minimal variability without O2 Fetal Wellbeing:  Category II Pain Control:  Epidural Anticipated MOD:  Dr. Macon Large to discuss with patient; favoring C-Section  Andrena Mews, DO [facility] Resident - PGY-2 04/23/2012 7:42 AM

## 2012-04-23 NOTE — Progress Notes (Signed)
Dr Berline Chough updated on FHR, UC pattern, and nursing interventions.  Dr to report to unit in 10 minutes.

## 2012-04-23 NOTE — Anesthesia Postprocedure Evaluation (Addendum)
  Anesthesia Post-op Note  Patient: Alexis Montgomery  Procedure(s) Performed: Procedure(s): CESAREAN SECTION (N/A)  Patient Location: PACU  Anesthesia Type:Epidural  Level of Consciousness: awake, alert  and oriented  Airway and Oxygen Therapy: Patient Spontanous Breathing  Post-op Pain: none  Post-op Assessment: Post-op Vital signs reviewed, Patient's Cardiovascular Status Stable, Respiratory Function Stable, Patent Airway, No signs of Nausea or vomiting, Pain level controlled, No headache, No backache, No residual numbness and No residual motor weakness  Post-op Vital Signs: Reviewed and stable  Complications: No apparent anesthesia complications

## 2012-04-23 NOTE — Progress Notes (Signed)
   Alexis Montgomery is a 21 y.o. G1P0000 at [redacted]w[redacted]d  admitted for induction of labor due to Pre-eclamptic toxemia of pregnancy..  Subjective: Comfortable with epidural  Objective: BP 157/111  Pulse 103  Temp(Src) 98.1 F (36.7 C) (Oral)  Resp 18  Ht 5\' 3"  (1.6 m)  Wt 224 lb (101.606 kg)  BMI 39.69 kg/m2 Total I/O In: 2227.1 [P.O.:408; I.V.:1619.1; IV Piggyback:200] Out: 1000 [Urine:1000]  FHT:  FHR: 130 bpm, variability: minimal ,  accelerations:  Abscent,  decelerations:  Present late UC:   regular, every 3 minutes SVE:   Dilation: 6.5 Effacement (%): 80 Station: -2 Exam by:: L Lamon RN Pitocin @ 28 mu/min Will restart 02 Labs: Lab Results  Component Value Date   WBC 10.8* 04/22/2012   HGB 11.3* 04/22/2012   HCT 33.6* 04/22/2012   MCV 79.6 04/22/2012   PLT 281 04/22/2012    Assessment / Plan: Protracted active phase  Labor: protracted active phase Fetal Wellbeing:  Category II Pain Control:  Epidural Anticipated MOD:  NSVD  Montgomery,Alexis Treu 04/23/2012, 4:22 AM

## 2012-04-23 NOTE — Progress Notes (Signed)
Dr Berline Chough updated on FHR, UC pattern, and nursing interventions.  Dr reviewed strip remotely.  Orders given to give a bolus.

## 2012-04-23 NOTE — Anesthesia Postprocedure Evaluation (Signed)
  Anesthesia Post-op Note  Patient: Alexis Montgomery  Procedure(s) Performed: Procedure(s): CESAREAN SECTION (N/A)  Patient Location: ICU  Anesthesia Type:Epidural  Level of Consciousness: awake  Airway and Oxygen Therapy: Patient Spontanous Breathing  Post-op Pain: mild  Post-op Assessment: Patient's Cardiovascular Status Stable and Respiratory Function Stable  Post-op Vital Signs: stable  Complications: No apparent anesthesia complications

## 2012-04-23 NOTE — Op Note (Signed)
[name] PROCEDURE DATE: 04/18/2012 - 04/23/2012  PREOPERATIVE DIAGNOSES: Intrauterine pregnancy at  [redacted]w[redacted]d weeks gestation; failure of cervical dilatation; prolonged active phase; preeclampsia   POSTOPERATIVE DIAGNOSES: The same  PROCEDURE: Primary Low Transverse Cesarean Section  SURGEON:  Dr. Jaynie Collins  ANESTHESIOLOGIST: Dr. Cristela Blue  INDICATIONS: Alexis Montgomery is a 22 y.o. G1P0000 at [redacted]w[redacted]d here for cesarean section secondary to the indications listed under preoperative diagnoses; please see preoperative note for further details.  The risks of cesarean section were discussed with the patient including but were not limited to: bleeding which may require transfusion or reoperation; infection which may require antibiotics; injury to bowel, bladder, ureters or other surrounding organs; injury to the fetus; need for additional procedures including hysterectomy in the event of a life-threatening hemorrhage; placental abnormalities wth subsequent pregnancies, incisional problems, thromboembolic phenomenon and other postoperative/anesthesia complications.   The patient concurred with the proposed plan, giving informed written consent for the procedure.    FINDINGS:  Viable female infant in cephalic presentation.  Apgars 8 and 9.  Clear amniotic fluid.  Intact placenta, three vessel cord.  Normal uterus, fallopian tubes and ovaries bilaterally.  ANESTHESIA: Epidural INTRAVENOUS FLUIDS: 600 ml ESTIMATED BLOOD LOSS: 1000 ml URINE OUTPUT:  150 ml SPECIMENS: Placenta sent to pathology COMPLICATIONS: None immediate  PROCEDURE IN DETAIL:  The patient preoperatively received intravenous antibiotics and had sequential compression devices applied to her lower extremities.  She was then taken to the operating room where the epidural anesthesia was dosed up to surgical level and was found to be adequate. She was then placed in a dorsal supine position with a leftward tilt, and prepped and draped in a  sterile manner.  A foley catheter was placed into her bladder and attached to constant gravity.  After an adequate timeout was performed, a Pfannenstiel skin incision was made with scalpel and carried through to the underlying layer of fascia. The fascia was incised in the midline, and this incision was extended bilaterally using the Mayo scissors.  Kocher clamps were applied to the superior aspect of the fascial incision and the underlying rectus muscles were dissected off bluntly. A similar process was carried out on the inferior aspect of the fascial incision. The rectus muscles were separated in the midline bluntly and the peritoneum was entered bluntly. Attention was turned to the lower uterine segment where a low transverse hysterotomy was made with a scalpel and extended bilaterally bluntly.  The infant was successfully delivered, the cord was clamped and cut and the infant was handed over to awaiting neonatology team. Uterine massage was then administered, and the placenta delivered intact with a three-vessel cord. The uterus was then cleared of clot and debris.  The hysterotomy was closed with 0 Vicryl in a running locked fashion, and an imbricating layer was also placed with 0 Vicryl. The pelvis was cleared of all clot and debris. Hemostasis was confirmed on all surfaces.  The peritoneum and the muscles were reapproximated using 0 Vicryl interrupted stitches. The fascia was then closed using 0 Vicryl  in a running fashion.  The subcutaneous layer was irrigated, then reapproximated with 0 Vicryl interrupted stitches, and the skin was closed with a 4-0 Vicryl subcuticular stitch. 30 ml of 0.5% Marcaine was injected subcutaneously around the incision. The patient tolerated the procedure well. Sponge, lap, instrument and needle counts were correct x 2.  She was taken to the recovery room in stable condition.

## 2012-04-23 NOTE — Progress Notes (Signed)
Drenda Freeze CNM gave orders to continue to go up on pitocin with a maximum of 30.

## 2012-04-23 NOTE — Progress Notes (Signed)
Very soon after Pitocin was d/c'd, FHR gained variability and lost the decels, + accels. MVU's decreased to ~ .   Pitocin restarted about 30 minutes ago, MVU's now~119mmHg.  FHR 130's, avg LTV, no accels, no decels.  Will continue to monitor, increasing pitocin until adequate labor.

## 2012-04-24 LAB — CBC
HCT: 24.5 % — ABNORMAL LOW (ref 36.0–46.0)
Hemoglobin: 8.1 g/dL — ABNORMAL LOW (ref 12.0–15.0)
MCH: 26.5 pg (ref 26.0–34.0)
MCHC: 33.1 g/dL (ref 30.0–36.0)

## 2012-04-24 LAB — COMPREHENSIVE METABOLIC PANEL
Alkaline Phosphatase: 89 U/L (ref 39–117)
BUN: 10 mg/dL (ref 6–23)
CO2: 26 mEq/L (ref 19–32)
Calcium: 6.9 mg/dL — ABNORMAL LOW (ref 8.4–10.5)
GFR calc Af Amer: >90 mL/min (ref >90)
GFR calc non Af Amer: >90 mL/min (ref >90)
Glucose, Bld: 72 mg/dL (ref 70–99)
Potassium: 4.5 mEq/L (ref 3.5–5.1)
Total Protein: 3.9 g/dL — ABNORMAL LOW (ref 6.0–8.3)

## 2012-04-24 MED ORDER — AMLODIPINE BESYLATE 5 MG PO TABS
5.0000 mg | ORAL_TABLET | Freq: Every day | ORAL | Status: DC
  Administered 2012-04-24 – 2012-04-26 (×3): 5 mg via ORAL
  Filled 2012-04-24 (×4): qty 1

## 2012-04-24 MED ORDER — HYDROCHLOROTHIAZIDE 25 MG PO TABS
25.0000 mg | ORAL_TABLET | Freq: Every day | ORAL | Status: DC
  Administered 2012-04-24 – 2012-04-26 (×3): 25 mg via ORAL
  Filled 2012-04-24 (×4): qty 1

## 2012-04-24 NOTE — Progress Notes (Signed)
Post Partum Day 1 Subjective: no complaints, voiding and tolerating PO  Objective: Blood pressure 132/76, pulse 82, temperature 98.1 F (36.7 C), temperature source Oral, resp. rate 18, height 5\' 2"  (1.575 m), weight 231 lb 4.8 oz (104.917 kg), SpO2 96.00%, unknown if currently breastfeeding. I/O: +6.4 L, 150 cc/hour over last 8 hours.  Physical Exam:  General: alert, cooperative and appears stated age 22: appropriate Uterine Fundus: firm Incision: no significant drainage DVT Evaluation: No evidence of DVT seen on physical exam.   Recent Labs  04/23/12 1126 04/24/12 0630  HGB 9.4* 8.1*  HCT 28.3* 24.5*    Assessment/Plan: Plan for discharge tomorrow, Breastfeeding, Lactation consult, Circumcision prior to discharge and Contraception Nexplanon Magnesium off this am Monitor BP after Magnesium off.   LOS: 6 days   Siyona Coto S 04/24/2012, 7:16 AM

## 2012-04-25 LAB — CCBB MATERNAL DONOR DRAW

## 2012-04-25 MED ORDER — IBUPROFEN 600 MG PO TABS: 600.0000 mg | ORAL_TABLET | Freq: Four times a day (QID) | ORAL | Status: DC

## 2012-04-25 MED ORDER — OXYCODONE-ACETAMINOPHEN 5-325 MG PO TABS: 1.0000 | ORAL_TABLET | ORAL | Status: DC | PRN

## 2012-04-25 MED ORDER — AMLODIPINE BESYLATE 5 MG PO TABS: 5.0000 mg | ORAL_TABLET | Freq: Every day | ORAL | Status: DC

## 2012-04-25 MED ORDER — FERROUS SULFATE 325 (65 FE) MG PO TABS
325.0000 mg | ORAL_TABLET | Freq: Two times a day (BID) | ORAL | Status: DC
  Administered 2012-04-25 – 2012-04-26 (×2): 325 mg via ORAL
  Filled 2012-04-25 (×3): qty 1

## 2012-04-25 MED ORDER — FERROUS SULFATE 325 (65 FE) MG PO TABS: 325.0000 mg | ORAL_TABLET | Freq: Two times a day (BID) | ORAL | Status: DC

## 2012-04-25 MED ORDER — SENNOSIDES-DOCUSATE SODIUM 8.6-50 MG PO TABS: 2.0000 | ORAL_TABLET | Freq: Every day | ORAL | Status: DC

## 2012-04-25 MED ORDER — HYDROCHLOROTHIAZIDE 25 MG PO TABS: 25.0000 mg | ORAL_TABLET | Freq: Every day | ORAL | Status: DC

## 2012-04-25 NOTE — Progress Notes (Signed)
Addendum to prior note.  Mom is C/S post Magnesium Sulfate from AICU  afternoon on 4/19.

## 2012-04-25 NOTE — Progress Notes (Signed)
Brought to nursery per mo request to rest due to fatigue.

## 2012-04-25 NOTE — Progress Notes (Signed)
[place] [facility] notified of b/p/no new orders received

## 2012-04-25 NOTE — Progress Notes (Signed)
Subjective: Postpartum Day 2: Cesarean Delivery Patient reports incisional pain (controlled), tolerating PO, + flatus and no problems voiding.   Breast and bottle feeding. Plans Nexplanon for contraception.  Objective: Vital signs in last 24 hours: Temp:  [98 F (36.7 C)-99 F (37.2 C)] 99 F (37.2 C) (04/20 0916) Pulse Rate:  [80-92] 82 (04/20 0916) Resp:  [16-20] 18 (04/20 0916) BP: (126-162)/(75-109) 149/96 mmHg (04/20 0916) SpO2:  [98 %-99 %] 99 % (04/20 0916)  Physical Exam:  General: alert, cooperative and no distress Lochia: appropriate Uterine Fundus: firm Incision: healing well, no significant drainage, no dehiscence, no significant erythema DVT Evaluation: No evidence of DVT seen on physical exam. Negative Homan's sign. No cords or calf tenderness. Calf/Ankle edema is present - equal bilaterally   Recent Labs  04/23/12 1126 04/24/12 0630  HGB 9.4* 8.1*  HCT 28.3* 24.5*    Assessment/Plan: Status post Cesarean section. Doing well postoperatively.  Continue current care. Blood pressures 140-150s/80-90s, high of 162/109 at 1800 last night. On Amlodipine 5 mg and HCTZ 25 mg. Will monitor today. Asymptomatic. Baby not dismissed due to age. Will plan to discharge mom tomorrow.  Napoleon Form 04/25/2012, 11:06 AM

## 2012-04-26 ENCOUNTER — Encounter: Payer: Self-pay | Admitting: *Deleted

## 2012-04-26 ENCOUNTER — Encounter (HOSPITAL_COMMUNITY): Payer: Self-pay | Admitting: Obstetrics & Gynecology

## 2012-04-26 LAB — CULTURE, BETA STREP (GROUP B ONLY)

## 2012-04-26 NOTE — Lactation Note (Signed)
This note was copied from the chart of Alexis Montgomery. Lactation Consultation Note Mom states she is very worried about br feeding and has decided she is too anxious to do it, she only wants to formula feed baby. Mom states that even though she knows br milk is better for her baby, she is choosing formula due to being so worried about pumping and "breaking out in boils". Lactation phone number provided, enc mom to call if she changes her mind and wants counseling on how to successfully br feed baby and/or pump.   Patient Name: Alexis Franca Stakes [id number] Date: 04/26/2012     Maternal Data    Feeding Feeding Type: Formula Feeding method: Bottle Nipple Type: Slow - flow  LATCH Score/Interventions                      Lactation Tools Discussed/Used     Consult Status      Lenard Forth 04/26/2012, 9:43 AM

## 2012-04-26 NOTE — Discharge Summary (Signed)
Attestation of Attending Supervision of Advanced Practitioner (CNM/NP): Evaluation and management procedures were performed by the Advanced Practitioner under my supervision and collaboration.  I have reviewed the Advanced Practitioner's note and chart, and I agree with the management and plan.  Maddalynn Barnard 04/26/2012 9:18 AM

## 2012-04-26 NOTE — Discharge Summary (Signed)
Obstetric Discharge Summary Reason for Admission: induction of labor and due to Severe Range Pre-Ecclampsia Prenatal Procedures: Preeclampsia, ultrasound and normal [place] Intrapartum Procedures: cesarean: low cervical, transverse, GBS prophylaxis and magnesium, Lebetalol, 24 hour urine collection with >10,000mg ;  Postpartum Procedures: none Complications-Operative and Postpartum: none Hemoglobin  Date Value Range Status  04/24/2012 8.1* 12.0 - 15.0 g/dL Final  04/14/8117 14.7   Final     HCT  Date Value Range Status  04/24/2012 24.5* 36.0 - 46.0 % Final  03/02/2012 34   Final   BRIEF HOSPITAL COURSE: Pt was admitted for induction of labor due to severe range pre-ecclampsia.  Started on Magnesium and lebetalol.  24 hour urine revealed >10,000mg  protein /24 hours.  Induction with Foley and Pitocin.  Non-reassuring heart tones and labor dystocia with adequate MVUs lead to LTCS.  24 hours of mag following with BP still elevated but improving on amlodipine and HCTZ. D/c to home with 3 day follow up for BP.   Physical Exam:  General: alert, cooperative and no distress Lochia: appropriate Uterine Fundus: firm Incision: healing well, no significant drainage, no dehiscence, no significant erythema DVT Evaluation: No evidence of DVT seen on physical exam. Calf/Ankle edema is present.  Discharge Diagnoses: Failed induction, Preeclampsia and Pre-Term labor due to Preeclampsia  Discharge Information: Date: 04/26/2012 Activity: pelvic rest and lifting restriction of baby weight Diet: routine Medications: Ibuprofen and Percocet Condition: stable Instructions: refer to practice specific booklet Discharge to: home Follow-up Information   Follow up with [facility]. Schedule an appointment as soon as possible for a visit in 3 days. (For blood pressure check)    Contact information:   [address] [place] [address] [phone]      Follow up with [facility]. Schedule an  appointment as soon as possible for a visit today. (circumcision)    Contact information:   [address] [place] [address] [phone]      Newborn Data: Live born female  Birth Weight: 6 lb 9.6 oz (2995 g) APGAR: 8, 9  Home with mother.  Andrena Mews, DO [facility] Resident - PGY-2 04/26/2012 7:48 AM

## 2012-04-28 ENCOUNTER — Emergency Department (HOSPITAL_COMMUNITY)
Admission: EM | Admit: 2012-04-28 | Discharge: 2012-04-29 | Disposition: A | Discharge status: Home / Self Care | Payer: Medicaid Other | Attending: Emergency Medicine | Admitting: Emergency Medicine

## 2012-04-28 ENCOUNTER — Encounter (HOSPITAL_COMMUNITY): Payer: Self-pay

## 2012-04-28 DIAGNOSIS — Z79899 Other long term (current) drug therapy: Secondary | ICD-10-CM | POA: Insufficient documentation

## 2012-04-28 DIAGNOSIS — Z872 Personal history of diseases of the skin and subcutaneous tissue: Secondary | ICD-10-CM | POA: Insufficient documentation

## 2012-04-28 DIAGNOSIS — I10 Essential (primary) hypertension: Secondary | ICD-10-CM

## 2012-04-28 DIAGNOSIS — R42 Dizziness and giddiness: Secondary | ICD-10-CM | POA: Insufficient documentation

## 2012-04-28 NOTE — ED Provider Notes (Signed)
History  This chart was scribed for [employer]. Colon Branch, MD by Shari Heritage and Lacey Jensen , ED Scribes. The patient was seen in room APA03/APA03. Patient's care was started at 2257.   CSN: [id number]  Arrival date & time 04/28/12  2201   First MD Initiated Contact with Patient 04/28/12 2257      Chief Complaint  Patient presents with  . Hypertension  . Dizziness     The history is provided by the patient. No language interpreter was used.     HPI Comments: Alexis Montgomery is a 22 y.o. female who presents to the Emergency Department complaining of dizziness and elevated blood pressure for the past several hours. Patient states that her blood pressure was 171/114 when she measured it tonight at home. She denies fever, chills, nausea, vomiting, diarrhea or other symptoms at this time. Patient says that she was advised to go [facility], but ultimately came to [facility] for evaluation instead. Patient delivered her baby early at 52 weeks on 04/23/2012 due to patient's pregnancy induced hypertension. Patient's child was born 6 lb 9 oz. Patient is not breastfeeding.  OB/GYN Dr. Emelda Fear  Past Medical History  Diagnosis Date  . Recurrent boils   . Pregnancy induced hypertension     Past Surgical History  Procedure Laterality Date  . No past surgeries    . Cesarean section N/A 04/23/2012    Procedure: CESAREAN SECTION;  Surgeon: Tereso Newcomer, MD;  Location: [facility];  Service: Obstetrics;  Laterality: N/A;    Family History  Problem Relation Age of Onset  . HIV Mother   . Diabetes Paternal Uncle     History  Substance Use Topics  . Smoking status: Never Smoker   . Smokeless tobacco: Never Used  . Alcohol Use: No    OB History   Grav Para Term Preterm Abortions TAB SAB Ect Mult Living   1 1 0 1 0 0 0 0 0 1       Review of Systems  Constitutional: Negative for fever.       10 Systems reviewed and are negative for acute change except as noted in the HPI.  HENT:  Negative for congestion.   Eyes: Negative for discharge and redness.  Respiratory: Negative for cough and shortness of breath.   Cardiovascular: Negative for chest pain.  Gastrointestinal: Negative for vomiting and abdominal pain.  Musculoskeletal: Negative for back pain.  Skin: Negative for rash.  Neurological: Negative for syncope, numbness and headaches.  Psychiatric/Behavioral:       No behavior change.   A complete 10 system review of systems was obtained and all systems are negative except as noted in the HPI and PMH.   Allergies  Review of patient's allergies indicates no known allergies.  Home Medications   Current Outpatient Rx  Name  Route  Sig  Dispense  Refill  . amLODipine (NORVASC) 5 MG tablet   Oral   Take 1 tablet (5 mg total) by mouth daily.   30 tablet   1   . ferrous sulfate 325 (65 FE) MG tablet   Oral   Take 1 tablet (325 mg total) by mouth 2 (two) times daily with a meal.   60 tablet   3   . hydrochlorothiazide (HYDRODIURIL) 25 MG tablet   Oral   Take 1 tablet (25 mg total) by mouth daily.   30 tablet   1   . ibuprofen (ADVIL,MOTRIN) 600 MG tablet   Oral  Take 1 tablet (600 mg total) by mouth every 6 (six) hours.   30 tablet   1   . oxyCODONE-acetaminophen (PERCOCET/ROXICET) 5-325 MG per tablet   Oral   Take 1-2 tablets by mouth every 4 (four) hours as needed.   30 tablet   0   . senna-docusate (SENOKOT-S) 8.6-50 MG per tablet   Oral   Take 2 tablets by mouth at bedtime.   60 tablet   0     Triage Vitals: BP 143/92  Pulse 108  Temp(Src) 98.3 F (36.8 C) (Oral)  Resp 24  Ht 5\' 3"  (1.6 m)  Wt 205 lb (92.987 kg)  BMI 36.32 kg/m2  SpO2 100%  Breastfeeding? No  Physical Exam  Constitutional: She is oriented to person, place, and time. She appears well-developed and well-nourished.  HENT:  Head: Normocephalic and atraumatic.  Eyes: Conjunctivae and EOM are normal. Pupils are equal, round, and reactive to light.  Neck: Normal  range of motion. Neck supple.  Cardiovascular: Normal rate and regular rhythm.   Pulmonary/Chest: Effort normal and breath sounds normal.  Abdominal: Soft. Bowel sounds are normal. There is no tenderness.  Musculoskeletal: Normal range of motion.  Neurological: She is alert and oriented to person, place, and time.  Skin: Skin is warm.    ED Course  Procedures (including critical care time) DIAGNOSTIC STUDIES: Oxygen Saturation is 100% on room air, normal by my interpretation.    COORDINATION OF CARE: 11:15 PM- Patient informed of current plan for treatment and evaluation and agrees with plan at this time.        2330 BP 143/92. Will recheck again in 1/2 hour. 1240 BP 144/89. Patient is eating and drinking. No c/o. Will not treat current blood pressure.   MDM  Patient who delivered by C-section on Friday for pre eclampsia here with elevated blood pressure. Allowed to sit without therapy. Repeat blood pressures were reasonable. No treatment was initiated. Reviewed results with patient.  Pt feels improved after observation and/or treatment in ED.Pt stable in ED with no significant deterioration in condition.The patient appears reasonably screened and/or stabilized for discharge and I doubt any other medical condition or other [facility] requiring further screening, evaluation, or treatment in the ED at this time prior to discharge.   I personally performed the services described in this documentation, which was scribed in my presence. The recorded information has been reviewed and considered.  MDM Reviewed: nursing note and vitals           Nicoletta Dress. Colon Branch, MD 04/29/12 [phone]

## 2012-04-28 NOTE — ED Notes (Signed)
I had a baby on Friday, he was not due until May 15th. They had to deliver the baby due to my blood pressure. BP was 171/114, called the doctor and they said to come to the ER.

## 2012-04-29 ENCOUNTER — Encounter: Payer: Self-pay | Admitting: Adult Health

## 2012-04-29 ENCOUNTER — Ambulatory Visit (INDEPENDENT_AMBULATORY_CARE_PROVIDER_SITE_OTHER): Payer: Medicaid Other | Admitting: Adult Health

## 2012-04-29 VITALS — BP 138/98 | Ht 63.0 in | Wt 196.0 lb

## 2012-04-29 DIAGNOSIS — I10 Essential (primary) hypertension: Secondary | ICD-10-CM

## 2012-04-29 DIAGNOSIS — O165 Unspecified maternal hypertension, complicating the puerperium: Secondary | ICD-10-CM

## 2012-04-29 NOTE — Patient Instructions (Addendum)
Continue Norvasc and HCTZ  Decrease salt and increase water Re check BP 1 week Check out my chart

## 2012-04-29 NOTE — Progress Notes (Signed)
Subjective:     Patient ID: Alexis Montgomery, female   DOB: Jun 15, 1990, 22 y.o.   MRN: [id number]  HPI Alexis Montgomery is a 22 year old white female who had a C-Section 04/23/12 for PIH,in today for BP check.she was seen in the ER last night for headache and her BP was 146/113,143/92, and 144/89.she does not have a headache now or blurred vision. Taking Norvasc 5 mg 1 daily and HCTZ 25 mg 1 daily  Review of Systems Complaints as above. Reviewed past medical,surgical, social and family history. Reviewed medications and allergies. Discussed with Dr. Despina Hidden    Objective:   Physical Exam Blood pressure 152/98, height 5\' 3"  (1.6 m), weight 196 lb (88.905 kg), not currently breastfeeding.   BP re check 138/98, no RUQ pain, DTRs 1-2+ no clonus.No swelling in feet and ankles. C-section incision edges together with out redness, or drainage. Assessment:      Hypertension   Plan:      Continue Norvasc and HCTZ Decrease salt Rest Recheck BP 1 week  Note given to Return to work 06/21/12. Call with any problems

## 2012-05-06 ENCOUNTER — Ambulatory Visit (INDEPENDENT_AMBULATORY_CARE_PROVIDER_SITE_OTHER): Payer: Medicaid Other | Admitting: Adult Health

## 2012-05-06 ENCOUNTER — Encounter: Payer: Self-pay | Admitting: Adult Health

## 2012-05-06 VITALS — BP 126/82 | Ht 63.0 in | Wt 189.0 lb

## 2012-05-06 DIAGNOSIS — I10 Essential (primary) hypertension: Secondary | ICD-10-CM

## 2012-05-06 NOTE — Patient Instructions (Addendum)
Continue medications Rest,  increase water Follow up 1 week

## 2012-05-06 NOTE — Progress Notes (Signed)
Subjective:     Patient ID: Alexis Montgomery, female   DOB: [birth year], 22 y.o.   MRN: [id number]  HPI Alexis Montgomery is back today for follow up BP check. On 4/24 her BP was 152/98 and weight was 196 pounds, she is down 7 pounds and her BP is 126/82.  Review of Systems Has headache today but is good otherwise. Reviewed past medical,surgical, social and family history. Reviewed medications and allergies.     Objective:   Physical Exam Blood pressure 126/82, height 5\' 3"  (1.6 m), weight 189 lb (85.73 kg), not currently breastfeeding. Talk only.    Assessment:      Hypertension    Plan:      Continue Norvasc 5 mg 1 daily and HCTZ 25 mg 1 daily Rest Increase water Re check BP 1 week

## 2012-05-12 ENCOUNTER — Telehealth: Payer: Self-pay | Admitting: Obstetrics and Gynecology

## 2012-05-12 NOTE — Telephone Encounter (Signed)
Pt has an postpartum appointment tomorrow. No c/o dizziness, headaches or swelling.

## 2012-05-13 ENCOUNTER — Encounter: Payer: Self-pay | Admitting: Adult Health

## 2012-05-13 ENCOUNTER — Ambulatory Visit (INDEPENDENT_AMBULATORY_CARE_PROVIDER_SITE_OTHER): Payer: Medicaid Other | Admitting: Adult Health

## 2012-05-13 VITALS — BP 130/84 | Ht 63.0 in | Wt 189.0 lb

## 2012-05-13 DIAGNOSIS — Z3009 Encounter for other general counseling and advice on contraception: Secondary | ICD-10-CM

## 2012-05-13 DIAGNOSIS — I10 Essential (primary) hypertension: Secondary | ICD-10-CM

## 2012-05-13 DIAGNOSIS — Z3049 Encounter for surveillance of other contraceptives: Secondary | ICD-10-CM

## 2012-05-13 MED ORDER — AMLODIPINE BESYLATE 10 MG PO TABS: 10.0000 mg | ORAL_TABLET | Freq: Every day | ORAL | Status: DC

## 2012-05-13 NOTE — Progress Notes (Signed)
Subjective:     Patient ID: Alexis Montgomery, female   DOB: Oct 03, 1990, 22 y.o.   MRN: [id number]  HPI Alexis Montgomery is in for a BP check.She has had an occasional headache, but feels good.   Review of Systems Positives as above. Reviewed past medical,surgical, social and family history. Reviewed medications and allergies.     Objective:   Physical Exam  Blood pressure 130/84, height 5\' 3"  (1.6 m), weight 189 lb (85.73 kg), not currently breastfeeding.    On arrival BP was 140/98.Skin warm and dry. No RUQ pain, no swelling in feet and ankles. DTRs 1+ Will increase Norvasc to 10 mg, take 2 5 mg tabs then get new Rx., continue with HCTZ.We discussed IUD, Nexplanon, and Depo and pills and she thinks she wants nexplanon. Assessment:       Hypertension  Contraceptive counselling     Plan:      Increase norvasc to 10 mg Review nexplanon handout  Follow up in 1 week for BP check

## 2012-05-13 NOTE — Patient Instructions (Addendum)
Increase norvasc to 10 mg  Recheck BP in 1 weeks Review nexplanon handout Follow up in 1 week

## 2012-05-20 ENCOUNTER — Encounter: Payer: Self-pay | Admitting: Adult Health

## 2012-05-20 ENCOUNTER — Ambulatory Visit (INDEPENDENT_AMBULATORY_CARE_PROVIDER_SITE_OTHER): Payer: Medicaid Other | Admitting: Adult Health

## 2012-05-20 VITALS — BP 140/96 | Ht 63.0 in | Wt 190.0 lb

## 2012-05-20 DIAGNOSIS — R809 Proteinuria, unspecified: Secondary | ICD-10-CM

## 2012-05-20 DIAGNOSIS — I10 Essential (primary) hypertension: Secondary | ICD-10-CM

## 2012-05-20 DIAGNOSIS — Z1389 Encounter for screening for other disorder: Secondary | ICD-10-CM

## 2012-05-20 DIAGNOSIS — R319 Hematuria, unspecified: Secondary | ICD-10-CM

## 2012-05-20 DIAGNOSIS — Z331 Pregnant state, incidental: Secondary | ICD-10-CM

## 2012-05-20 DIAGNOSIS — O135 Gestational [pregnancy-induced] hypertension without significant proteinuria, complicating the puerperium: Secondary | ICD-10-CM

## 2012-05-20 LAB — POCT URINALYSIS DIPSTICK: Protein, UA: POSITIVE

## 2012-05-20 MED ORDER — LISINOPRIL 10 MG PO TABS: 10.0000 mg | ORAL_TABLET | Freq: Every day | ORAL | Status: DC

## 2012-05-20 NOTE — Progress Notes (Signed)
Subjective:     Patient ID: Alexis Montgomery, female   DOB: September 01, 1990, 22 y.o.   MRN: [id number]  HPI Alexis Montgomery is back for BP check, she had a C-section 4/18 for preeclampsia. Increased her norvasc last week to 10 mg daily. She is going to the beach this week end.  Review of Systems She has headaches about every other day, no RUQ pain, no swelling. She wants nexplanon for birth control.  Reviewed past medical,surgical, social and family history. Reviewed medications and allergies.  Discussed with Dr. Emelda Fear. Dr. Emelda Fear wants to add lisinopril to the norvasc 10 mg and HCTZ 25 mg.    Objective:   Physical Exam Blood pressure 140/96, height 5\' 3"  (1.6 m), weight 190 lb (86.183 kg), not currently breastfeeding. BP on arrival was 148/102. Urine + for blood and protein.   Skin warm and dry. Lungs: clear to ausculation bilaterally. Cardiovascular: regular rate and rhythm. No RUQ tenderness, DTRs 1+ no clonus. No swelling in feet or ankles. Incision has 2 small openings no drainage noted, steri strips applied. Assessment:      Hypertension Hematuria Proteinuria    Plan:      Rx lisinopril 10 mg 1 daily, continue norvasc and HCTZ Use sunscreen at the beach, do not submerge in pool or ocean. Follow up 1 week to re check BP Get UA C&S and protein/creatnine ratio.

## 2012-05-20 NOTE — Patient Instructions (Addendum)
Follow up in 1 week BP check

## 2012-05-21 LAB — URINALYSIS
Bilirubin Urine: NEGATIVE
Glucose, UA: NEGATIVE mg/dL
Ketones, ur: NEGATIVE mg/dL
Nitrite: NEGATIVE
Protein, ur: >300 mg/dL — AB
Specific Gravity, Urine: 1.019 (ref 1.005–1.030)
Urobilinogen, UA: 0.2 mg/dL (ref 0.0–1.0)
pH: 6 (ref 5.0–8.0)

## 2012-05-21 LAB — MICROALBUMIN / CREATININE URINE RATIO
Creatinine, Urine: 159.2 mg/dL
Microalb Creat Ratio: 1473.8 mg/g — ABNORMAL HIGH (ref 0.0–30.0)
Microalb, Ur: 234.63 mg/dL — ABNORMAL HIGH (ref 0.00–1.89)

## 2012-05-25 ENCOUNTER — Telehealth: Payer: Self-pay | Admitting: Adult Health

## 2012-05-25 NOTE — Telephone Encounter (Signed)
Left message to call.

## 2012-05-27 ENCOUNTER — Encounter: Payer: Self-pay | Admitting: Adult Health

## 2012-05-27 ENCOUNTER — Ambulatory Visit (INDEPENDENT_AMBULATORY_CARE_PROVIDER_SITE_OTHER): Payer: Medicaid Other | Admitting: Adult Health

## 2012-05-27 VITALS — BP 142/86 | Ht 63.0 in | Wt 193.5 lb

## 2012-05-27 DIAGNOSIS — N390 Urinary tract infection, site not specified: Secondary | ICD-10-CM

## 2012-05-27 DIAGNOSIS — I10 Essential (primary) hypertension: Secondary | ICD-10-CM

## 2012-05-27 MED ORDER — AMPICILLIN 500 MG PO CAPS: 500.0000 mg | ORAL_CAPSULE | Freq: Four times a day (QID) | ORAL | Status: DC

## 2012-05-27 NOTE — Progress Notes (Signed)
Subjective:     Patient ID: Alexis Montgomery, female   DOB: [birth year], 22 y.o.   MRN: [id number]  HPI Alexis Montgomery is back for BP check, she has occasional headache at night that goes away with tylenol or advil. She has +gbs in urine will treat today with ampicillin.Has ? Boil near incision. Review of Systems Positives in HPI Reviewed past medical,surgical, social and family history. Reviewed medications and allergies.     Objective:   Physical Exam Blood pressure 142/86, height 5\' 3"  (1.6 m), weight 193 lb 8 oz (87.771 kg), not currently breastfeeding.   No swelling in feet or ankles. Incision has raw area like a bump popped, steri strips applied to pull edges together. Assessment:      Hypertension +GBS in urine    Plan:    Rx ampicillin 500 mg 1 qid x 10 days Continue BP meds Follow up June 2 as scheduled

## 2012-05-27 NOTE — Patient Instructions (Addendum)
Continue the BP meds Take ampicillin as directed Return June 2

## 2012-06-07 ENCOUNTER — Ambulatory Visit (INDEPENDENT_AMBULATORY_CARE_PROVIDER_SITE_OTHER): Payer: Medicaid Other | Admitting: Women's Health

## 2012-06-07 ENCOUNTER — Encounter: Payer: Self-pay | Admitting: Women's Health

## 2012-06-07 VITALS — BP 132/86 | Ht 63.0 in | Wt 191.8 lb

## 2012-06-07 DIAGNOSIS — D649 Anemia, unspecified: Secondary | ICD-10-CM

## 2012-06-07 DIAGNOSIS — N611 Abscess of the breast and nipple: Secondary | ICD-10-CM

## 2012-06-07 DIAGNOSIS — Z130 Encounter for screening for diseases of the blood and blood-forming organs and certain disorders involving the immune mechanism: Secondary | ICD-10-CM

## 2012-06-07 DIAGNOSIS — Z3400 Encounter for supervision of normal first pregnancy, unspecified trimester: Secondary | ICD-10-CM

## 2012-06-07 LAB — POCT HEMOGLOBIN: Hemoglobin: 13.5 g/dL (ref 12.2–16.2)

## 2012-06-07 MED ORDER — DOXYCYCLINE HYCLATE 50 MG PO CAPS: 100.0000 mg | ORAL_CAPSULE | Freq: Two times a day (BID) | ORAL | Status: DC

## 2012-06-07 NOTE — Patient Instructions (Signed)
Abscess An abscess is an infected area that contains a collection of pus and debris.It can occur in almost any part of the body. An abscess is also known as a furuncle or boil. CAUSES  An abscess occurs when tissue gets infected. This can occur from blockage of oil or sweat glands, infection of hair follicles, or a minor injury to the skin. As the body tries to fight the infection, pus collects in the area and creates pressure under the skin. This pressure causes pain. People with weakened immune systems have difficulty fighting infections and get certain abscesses more often.  SYMPTOMS Usually an abscess develops on the skin and becomes a painful mass that is red, warm, and tender. If the abscess forms under the skin, you may feel a moveable soft area under the skin. Some abscesses break open (rupture) on their own, but most will continue to get worse without care. The infection can spread deeper into the body and eventually into the bloodstream, causing you to feel ill.  DIAGNOSIS  Your caregiver will take your medical history and perform a physical exam. A sample of fluid may also be taken from the abscess to determine what is causing your infection. TREATMENT  Your caregiver may prescribe antibiotic medicines to fight the infection. However, taking antibiotics alone usually does not cure an abscess. Your caregiver may need to make a small cut (incision) in the abscess to drain the pus. In some cases, gauze is packed into the abscess to reduce pain and to continue draining the area. HOME CARE INSTRUCTIONS   Only take over-the-counter or prescription medicines for pain, discomfort, or fever as directed by your caregiver.  If you were prescribed antibiotics, take them as directed. Finish them even if you start to feel better.  If gauze is used, follow your caregiver's directions for changing the gauze.  To avoid spreading the infection:  Keep your draining abscess covered with a  bandage.  Wash your hands well.  Do not share personal care items, towels, or whirlpools with others.  Avoid skin contact with others.  Keep your skin and clothes clean around the abscess.  Keep all follow-up appointments as directed by your caregiver. SEEK MEDICAL CARE IF:   You have increased pain, swelling, redness, fluid drainage, or bleeding.  You have muscle aches, chills, or a general ill feeling.  You have a fever. MAKE SURE YOU:   Understand these instructions.  Will watch your condition.  Will get help right away if you are not doing well or get worse. Document Released: 10/02/2004 Document Revised: 06/24/2011 Document Reviewed: 03/07/2011 Exit[employer] Patient Information 2014 [employer].  

## 2012-06-07 NOTE — Progress Notes (Signed)
Subjective:    Alexis Montgomery is a 22 y.o. G62P0101 Caucasian female who presents for a postpartum visit. She is 6 weeks postpartum following a low cervical transverse Cesarean section. I have fully reviewed the prenatal and intrapartum course. She was induced at 35.1wks d/t severe range bp's, ha, and 24hr urine protein of 18,662. Over the course of a 3 day induction she progressed to 6.5/80/-2, but had protracted active phase and therefore a PLTCS was performed. The delivery was at 35.4 gestational weeks. Anesthesia: epidural. She received magnesium intrapartum, and for 24hr pp. She was d/c'd on norvasc 5mg  daily and hctz 25mg  daily. Postpartum course has been complicated by continued hypertension requiring increase in norvasc to 10mg  daily and addition of lisinopril 10mg  daily. Currently denies ha, scotomata, ruq/epigastric pain, n/v.  Baby's course has been uncomplicated. Baby is feeding by bottle. Bleeding lochia followed normal pp course, then menses began last week. Bowel function is normal. Bladder function is normal. Patient is not sexually active. Contraception method is none. Postpartum depression screening: negative.  She reports that the boil near her incision is better, but she has a large boil on Rt breast x 3wks. Hgb 8.1 upon d/c from hospital- was placed on iron supplement.  The following portions of the patient's history were reviewed and updated as appropriate: allergies, current medications, past medical history, past surgical history and problem list.  Review of Systems Pertinent items are noted in HPI.   Filed Vitals:   06/07/12 1144  BP: 132/86  Height: 5\' 3"  (1.6 m)  Weight: 191 lb 12.8 oz (87 kg)    Objective:     General:  alert, cooperative and no distress   Breasts:  ~3cm boil Rt breast w/ large area of erythema  Lungs: clear to auscultation bilaterally  Heart:  regular rate and rhythm  Abdomen: soft, nontender, incision approximated and healing well   Vulva: normal   Vagina: normal vagina, on menses  Cervix:  closed  Corpus: Well-involuted  Adnexa:  Non-palpable  Rectal Exam: no hemorrhoids  Breast: co-exam w/ JGriffin, NP  Hgb: 13.5  Assessment:   Postpartum exam 6 wks s/p PLTCS for failed IOL d/t severe pre-e PP HTN Rt breast boil Depression screening Contraception counseling   Plan:   Continue Norvasc 10mg  daily, HCTZ 25mg  daily, and Lisinopril 10mg  daily Stop iron supplement Rx: doxycycline 100mg  bid x 14d Contraception: Nexplanon- no sex until placed Follow up in: 2 weeks for f/u boil, bp check, nexplanon insertion, then app 6wks from today for f/u proteinuria

## 2012-06-10 ENCOUNTER — Encounter: Payer: Self-pay | Admitting: Adult Health

## 2012-06-10 ENCOUNTER — Ambulatory Visit (INDEPENDENT_AMBULATORY_CARE_PROVIDER_SITE_OTHER): Payer: Medicaid Other | Admitting: Adult Health

## 2012-06-10 VITALS — BP 130/88 | Ht 63.0 in | Wt 192.0 lb

## 2012-06-10 DIAGNOSIS — Z309 Encounter for contraceptive management, unspecified: Secondary | ICD-10-CM | POA: Insufficient documentation

## 2012-06-10 DIAGNOSIS — Z3202 Encounter for pregnancy test, result negative: Secondary | ICD-10-CM

## 2012-06-10 DIAGNOSIS — Z3200 Encounter for pregnancy test, result unknown: Secondary | ICD-10-CM

## 2012-06-10 DIAGNOSIS — Z30017 Encounter for initial prescription of implantable subdermal contraceptive: Secondary | ICD-10-CM

## 2012-06-10 LAB — POCT URINE PREGNANCY: Preg Test, Ur: NEGATIVE

## 2012-06-10 NOTE — Progress Notes (Addendum)
Subjective:     Patient ID: MIAMOR AYLER, female   DOB: 1990/05/15, 22 y.o.   MRN: [id number]  HPI Nayah is in for nexplanon insertion.  Review of Systems No complaints Reviewed past medical,surgical, social and family history. Reviewed medications and allergies.      Objective:   Physical Exam BP 130/88  Ht 5\' 3"  (1.6 m)  Wt 192 lb (87.091 kg)  BMI 34.02 kg/m2  LMP 06/06/2012  Breastfeeding? NoUrine pregnancy test negative.  Consent signed. Left arm cleansed with betadine, and injected with 1.5 cc 2% lidocaine and waited til numb. Nexplanon easily inserted and steri strips applied. Pressure dressing applied.   Rod palpated by pt and provider. Assessment:      Nexplanon insertion exp 4/16 lot #[id number] Contraceptive management    Plan:      Use condoms x 2-4 weeks, keep clean and dry x 24 hours, no heavy lifting, keep steri strips on x 72 hours, Keep pressure dressing on x 24 hours. Follow up prn problems.

## 2012-06-10 NOTE — Patient Instructions (Addendum)
Use condoms x 2 weeks, keep clean and dry x 24 hours, no heavy lifting, keep steri strips on x 72 hours, Keep pressure dressing on x 24 hours. Follow up prn problems.  

## 2012-06-21 ENCOUNTER — Ambulatory Visit: Payer: Medicaid Other | Admitting: Women's Health

## 2012-07-05 ENCOUNTER — Other Ambulatory Visit: Payer: Self-pay | Admitting: Adult Health

## 2012-07-05 ENCOUNTER — Encounter: Payer: Self-pay | Admitting: Adult Health

## 2012-07-05 ENCOUNTER — Ambulatory Visit (INDEPENDENT_AMBULATORY_CARE_PROVIDER_SITE_OTHER): Payer: Medicaid Other | Admitting: Adult Health

## 2012-07-05 ENCOUNTER — Telehealth: Payer: Self-pay | Admitting: Obstetrics and Gynecology

## 2012-07-05 VITALS — BP 120/80 | Ht 63.0 in | Wt 192.0 lb

## 2012-07-05 DIAGNOSIS — R809 Proteinuria, unspecified: Secondary | ICD-10-CM

## 2012-07-05 DIAGNOSIS — L732 Hidradenitis suppurativa: Secondary | ICD-10-CM

## 2012-07-05 DIAGNOSIS — I10 Essential (primary) hypertension: Secondary | ICD-10-CM

## 2012-07-05 MED ORDER — SULFAMETHOXAZOLE-TRIMETHOPRIM 800-160 MG PO TABS: 1.0000 | ORAL_TABLET | Freq: Two times a day (BID) | ORAL | Status: DC

## 2012-07-05 NOTE — Progress Notes (Signed)
Subjective:     Patient ID: Alexis Montgomery, female   DOB: 02-24-1990, 22 y.o.   MRN: [id number]  HPI Alexis Montgomery is back for a BP check.Complains of raw spot in incision. She was pre-e and had proteinuria during delivery. She got the nexplanon in June and has no complaints.  Review of Systems Positives as above Reviewed past medical,surgical, social and family history. Reviewed medications and allergies.     Objective:   Physical Exam BP 120/80  Ht 5\' 3"  (1.6 m)  Wt 192 lb (87.091 kg)  BMI 34.02 kg/m2  LMP 06/06/2012  Breastfeeding? Unknown Skin warm and dry. Neck: mid line trachea, normal thyroid. Lungs: clear to ausculation bilaterally. Cardiovascular: regular rate and rhythm.Has small raw area in C-section scar, she has hidradenitis like skin changes and acne.     Assessment:     Hypertension Hidradenitis Proteinuria    Plan:     Continue BP meds  Rx septra ds 1 bid x 14 days with 2 refills Check protein/cretinine ratio Follow up in 3 months

## 2012-07-05 NOTE — Patient Instructions (Addendum)
Take septra ds Continue BP meds  Follow up in 3 months

## 2012-07-06 ENCOUNTER — Telehealth: Payer: Self-pay | Admitting: Adult Health

## 2012-07-06 NOTE — Telephone Encounter (Signed)
Pt aware of labs, and I discussed with Dr Despina Hidden.

## 2012-08-19 NOTE — Telephone Encounter (Signed)
Done

## 2013-11-07 ENCOUNTER — Encounter: Payer: Self-pay | Admitting: Adult Health

## 2014-05-26 ENCOUNTER — Emergency Department (HOSPITAL_COMMUNITY)
Admission: EM | Admit: 2014-05-26 | Discharge: 2014-05-26 | Disposition: A | Discharge status: Home / Self Care | Payer: Medicaid Other | Attending: Emergency Medicine | Admitting: Emergency Medicine

## 2014-05-26 ENCOUNTER — Encounter (HOSPITAL_COMMUNITY): Payer: Self-pay | Admitting: Emergency Medicine

## 2014-05-26 DIAGNOSIS — G8929 Other chronic pain: Secondary | ICD-10-CM | POA: Diagnosis not present

## 2014-05-26 DIAGNOSIS — Y939 Activity, unspecified: Secondary | ICD-10-CM | POA: Diagnosis not present

## 2014-05-26 DIAGNOSIS — Y999 Unspecified external cause status: Secondary | ICD-10-CM | POA: Diagnosis not present

## 2014-05-26 DIAGNOSIS — Y929 Unspecified place or not applicable: Secondary | ICD-10-CM | POA: Diagnosis not present

## 2014-05-26 DIAGNOSIS — X58XXXA Exposure to other specified factors, initial encounter: Secondary | ICD-10-CM | POA: Diagnosis not present

## 2014-05-26 DIAGNOSIS — Z793 Long term (current) use of hormonal contraceptives: Secondary | ICD-10-CM | POA: Diagnosis not present

## 2014-05-26 DIAGNOSIS — S29019A Strain of muscle and tendon of unspecified wall of thorax, initial encounter: Secondary | ICD-10-CM | POA: Insufficient documentation

## 2014-05-26 DIAGNOSIS — M546 Pain in thoracic spine: Secondary | ICD-10-CM | POA: Diagnosis present

## 2014-05-26 MED ORDER — CYCLOBENZAPRINE HCL 10 MG PO TABS
10.0000 mg | ORAL_TABLET | Freq: Once | ORAL | Status: AC
  Administered 2014-05-26: 10 mg via ORAL
  Filled 2014-05-26: qty 1

## 2014-05-26 MED ORDER — CYCLOBENZAPRINE HCL 10 MG PO TABS: 10.0000 mg | ORAL_TABLET | Freq: Two times a day (BID) | ORAL | Status: DC | PRN

## 2014-05-26 MED ORDER — DICLOFENAC SODIUM 50 MG PO TBEC: 50.0000 mg | DELAYED_RELEASE_TABLET | Freq: Two times a day (BID) | ORAL | Status: DC

## 2014-05-26 NOTE — ED Notes (Addendum)
Pt reports chronic lower back pain flare-up x2 weeks. Pt reports pain is now in her upper back. Pt denies any known injury. Pt reports pain is worse with movement.nad noted. Pt denies cp,sob.

## 2014-05-26 NOTE — ED Notes (Signed)
Pain rt scapular area for 2 weeks, no known injury.Carries her child a lot.

## 2014-05-26 NOTE — ED Provider Notes (Signed)
CSN: [id number]     Arrival date & time 05/26/14  1246 History   First MD Initiated Contact with Patient 05/26/14 1259     Chief Complaint  Patient presents with  . Back Pain     (Consider location/radiation/quality/duration/timing/severity/associated sxs/prior Treatment) Patient is a 24 y.o. female presenting with back pain. The history is provided by the patient.  Back Pain Location:  Thoracic spine Quality:  Aching and shooting Radiates to:  R shoulder Pain severity:  Moderate Onset quality:  Gradual Duration:  2 weeks Timing:  Constant Progression:  Worsening Worsened by:  Movement, coughing and twisting  Alexis Montgomery is a 24 y.o. female who presents to the ED with right thoracic pain that radiates to the right scapula. She reports having chronic low back pain but the upper back pain is new. She feels that part of her problem may be related to her large breast and carrying her son around all the time. She denies UTI symptoms, n/v, fever, chills or other problems.   Past Medical History  Diagnosis Date  . Recurrent boils   . Pregnancy induced hypertension   . Contraceptive management 06/10/2012    nexplanon inserted left arm 06/10/12   Past Surgical History  Procedure Laterality Date  . No past surgeries    . Cesarean section N/A 04/23/2012    Procedure: CESAREAN SECTION;  Surgeon: Tereso NewcomerUgonna A Anyanwu, MD;  Location: [facility];  Service: Obstetrics;  Laterality: N/A;   Family History  Problem Relation Age of Onset  . HIV Mother   . Cancer Mother   . Diabetes Paternal Uncle   . Heart Problems Paternal Grandfather    History  Substance Use Topics  . Smoking status: Never Smoker   . Smokeless tobacco: Never Used  . Alcohol Use: No   OB History    Gravida Para Term Preterm AB TAB SAB Ectopic Multiple Living   1 1 0 1 0 0 0 0 0 1      Review of Systems  Musculoskeletal: Positive for back pain.  all other systems negative    Allergies  Review of patient's allergies  indicates no known allergies.  Home Medications   Prior to Admission medications   Medication Sig Start Date End Date Taking? Authorizing Provider  acetaminophen (TYLENOL) 325 MG tablet Take 650 mg by mouth every 6 (six) hours as needed for moderate pain.   Yes Historical Provider, MD  etonogestrel (NEXPLANON) 68 MG IMPL implant Inject 1 each into the skin once.   Yes Historical Provider, MD  amLODipine (NORVASC) 10 MG tablet Take 1 tablet (10 mg total) by mouth daily. Patient not taking: Reported on 05/26/2014 05/13/12   Adline PotterJennifer A Griffin, NP  cyclobenzaprine (FLEXERIL) 10 MG tablet Take 1 tablet (10 mg total) by mouth 2 (two) times daily as needed for muscle spasms. 05/26/14   Hope Orlene OchM Neese, NP  diclofenac (VOLTAREN) 50 MG EC tablet Take 1 tablet (50 mg total) by mouth 2 (two) times daily. 05/26/14   Hope Orlene OchM Neese, NP  sulfamethoxazole-trimethoprim (BACTRIM DS,SEPTRA DS) 800-160 MG per tablet Take 1 tablet by mouth 2 (two) times daily. Patient not taking: Reported on 05/26/2014 07/05/12   Adline PotterJennifer A Griffin, NP   BP 146/99 mmHg  Pulse 94  Temp(Src) 98.7 F (37.1 C) (Oral)  Resp 18  Ht 5\' 2"  (1.575 m)  Wt 200 lb (90.719 kg)  BMI 36.57 kg/m2  SpO2 99%  LMP 05/26/2014  Breastfeeding? No Physical Exam  Constitutional: She is  oriented to person, place, and time. She appears well-developed and well-nourished. No distress.  HENT:  Head: Normocephalic and atraumatic.  Right Ear: Tympanic membrane normal.  Left Ear: Tympanic membrane normal.  Nose: Nose normal.  Mouth/Throat: Uvula is midline, oropharynx is clear and moist and mucous membranes are normal.  Eyes: EOM are normal.  Neck: Normal range of motion. Neck supple.  Cardiovascular: Normal rate and regular rhythm.   Pulmonary/Chest: Effort normal. She has no wheezes. She has no rales.  Abdominal: Soft. Bowel sounds are normal. There is no tenderness.  Musculoskeletal: Normal range of motion.       Thoracic back: She exhibits tenderness  and spasm. She exhibits normal pulse. Decreased range of motion: due to pain.       Back:  Neurological: She is alert and oriented to person, place, and time. She has normal strength. No cranial nerve deficit or sensory deficit. Gait normal.  Reflex Scores:      Bicep reflexes are 2+ on the right side and 2+ on the left side.      Brachioradialis reflexes are 2+ on the right side and 2+ on the left side.      Patellar reflexes are 2+ on the right side and 2+ on the left side.      Achilles reflexes are 2+ on the right side and 2+ on the left side. Skin: Skin is warm and dry.  Psychiatric: She has a normal mood and affect. Her behavior is normal.  Nursing note and vitals reviewed.   ED Course  Procedures (including critical care time) Labs Review   MDM  24 y.o. female with hx of chronic lower back pain here today with upper back pain x 2 weeks with  Muscle spasm. Stable for d/c without focal neuro deficits and no indication for emergent neuro consult. Will treat for inflammation and muscle spasm. Discussed with the patient clinical findings and plan of care and all questioned fully answered. She will return if any problems arise.   Final diagnoses:  Thoracic myofascial strain, initial encounter       [facility] Neese, NP 05/26/14 1407  Tilden FossaElizabeth Rees, MD 05/26/14 [phone]

## 2014-06-13 ENCOUNTER — Encounter (HOSPITAL_COMMUNITY): Payer: Self-pay | Admitting: *Deleted

## 2014-06-13 ENCOUNTER — Emergency Department (HOSPITAL_COMMUNITY)
Admission: EM | Admit: 2014-06-13 | Discharge: 2014-06-13 | Disposition: A | Discharge status: Home / Self Care | Payer: Medicaid Other | Attending: Emergency Medicine | Admitting: Emergency Medicine

## 2014-06-13 DIAGNOSIS — L02213 Cutaneous abscess of chest wall: Secondary | ICD-10-CM | POA: Insufficient documentation

## 2014-06-13 DIAGNOSIS — B349 Viral infection, unspecified: Secondary | ICD-10-CM | POA: Insufficient documentation

## 2014-06-13 DIAGNOSIS — Z791 Long term (current) use of non-steroidal anti-inflammatories (NSAID): Secondary | ICD-10-CM | POA: Insufficient documentation

## 2014-06-13 DIAGNOSIS — L02211 Cutaneous abscess of abdominal wall: Secondary | ICD-10-CM | POA: Insufficient documentation

## 2014-06-13 DIAGNOSIS — L089 Local infection of the skin and subcutaneous tissue, unspecified: Secondary | ICD-10-CM | POA: Diagnosis present

## 2014-06-13 DIAGNOSIS — L0291 Cutaneous abscess, unspecified: Secondary | ICD-10-CM

## 2014-06-13 MED ORDER — ACETAMINOPHEN-CODEINE #3 300-30 MG PO TABS: 1.0000 | ORAL_TABLET | Freq: Four times a day (QID) | ORAL | Status: DC | PRN

## 2014-06-13 MED ORDER — IBUPROFEN 600 MG PO TABS: 600.0000 mg | ORAL_TABLET | Freq: Four times a day (QID) | ORAL | Status: DC | PRN

## 2014-06-13 MED ORDER — CEFTRIAXONE SODIUM 1 G IJ SOLR
1.0000 g | Freq: Once | INTRAMUSCULAR | Status: AC
  Administered 2014-06-13: 1 g via INTRAMUSCULAR
  Filled 2014-06-13: qty 10

## 2014-06-13 MED ORDER — ACETAMINOPHEN 325 MG PO TABS
650.0000 mg | ORAL_TABLET | Freq: Once | ORAL | Status: AC
  Administered 2014-06-13: 650 mg via ORAL
  Filled 2014-06-13: qty 2

## 2014-06-13 MED ORDER — IBUPROFEN 800 MG PO TABS
800.0000 mg | ORAL_TABLET | Freq: Once | ORAL | Status: AC
  Administered 2014-06-13: 800 mg via ORAL
  Filled 2014-06-13: qty 1

## 2014-06-13 MED ORDER — LIDOCAINE HCL (PF) 1 % IJ SOLN
INTRAMUSCULAR | Status: AC
  Administered 2014-06-13: 5 mL
  Filled 2014-06-13: qty 5

## 2014-06-13 NOTE — ED Notes (Addendum)
Pt [facility]. Pt states she has several others that have popped up since. Pt also c/o an intermittent fever. Pt started sulfamethox/trimetho 800-160 mg on Friday.

## 2014-06-13 NOTE — ED Notes (Signed)
Pt has swollen red area below lt  Breast. And several other smaller areas that are smaller. [facility] and taking Septra. Says she is no better.

## 2014-06-13 NOTE — ED Provider Notes (Signed)
CSN: [id number]     Arrival date & time 06/13/14  1940 History   First MD Initiated Contact with Patient 06/13/14 2122     Chief Complaint  Patient presents with  . Recurrent Skin Infections     (Consider location/radiation/quality/duration/timing/severity/associated sxs/prior Treatment) HPI Comments: Patient is a 24 year old female who presents to the emergency department with a complaint of recurrent skin infections.  The patient states that from time to time she has problems with boils. She says she has had multiple episodes of boils, some of them have had to be incised. Recently she developed one under the left breast. [facility], [facility] because she was also having body aches and generally not feeling well. She was treated with Septra. She states she is still not feeling very well. She is having more chills and body aches. It is of note that her son and other children in the home were diagnosed with hand, foot, and mouth disease recently.  The history is provided by the patient.    Past Medical History  Diagnosis Date  . Recurrent boils   . Pregnancy induced hypertension   . Contraceptive management 06/10/2012    nexplanon inserted left arm 06/10/12   Past Surgical History  Procedure Laterality Date  . No past surgeries    . Cesarean section N/A 04/23/2012    Procedure: CESAREAN SECTION;  Surgeon: Tereso NewcomerUgonna A Anyanwu, MD;  Location: [facility];  Service: Obstetrics;  Laterality: N/A;   Family History  Problem Relation Age of Onset  . HIV Mother   . Cancer Mother   . Diabetes Paternal Uncle   . Heart Problems Paternal Grandfather    History  Substance Use Topics  . Smoking status: Never Smoker   . Smokeless tobacco: Never Used  . Alcohol Use: No   OB History    Gravida Para Term Preterm AB TAB SAB Ectopic Multiple Living   1 1 0 1 0 0 0 0 0 1      Review of Systems  Constitutional: Positive for fever and chills. Negative for activity change.        All ROS Neg except as noted in HPI  HENT: Positive for congestion.   Eyes: Negative for photophobia and discharge.  Respiratory: Negative for cough, shortness of breath and wheezing.   Cardiovascular: Negative for chest pain and palpitations.  Gastrointestinal: Negative for abdominal pain and blood in stool.  Genitourinary: Negative for dysuria, frequency and hematuria.  Musculoskeletal: Negative for back pain, arthralgias and neck pain.  Skin: Positive for wound.  Neurological: Negative for dizziness, seizures and speech difficulty.  Psychiatric/Behavioral: Negative for hallucinations and confusion.      Allergies  Review of patient's allergies indicates no known allergies.  Home Medications   Prior to Admission medications   Medication Sig Start Date End Date Taking? Authorizing Provider  acetaminophen (TYLENOL) 325 MG tablet Take 650 mg by mouth every 6 (six) hours as needed for moderate pain.    Historical Provider, MD  amLODipine (NORVASC) 10 MG tablet Take 1 tablet (10 mg total) by mouth daily. Patient not taking: Reported on 05/26/2014 05/13/12   Adline PotterJennifer A Griffin, NP  cyclobenzaprine (FLEXERIL) 10 MG tablet Take 1 tablet (10 mg total) by mouth 2 (two) times daily as needed for muscle spasms. 05/26/14   Hope Orlene OchM Neese, NP  diclofenac (VOLTAREN) 50 MG EC tablet Take 1 tablet (50 mg total) by mouth 2 (two) times daily. 05/26/14  Hope Orlene Och, NP  etonogestrel (NEXPLANON) 68 MG IMPL implant Inject 1 each into the skin once.    Historical Provider, MD  sulfamethoxazole-trimethoprim (BACTRIM DS,SEPTRA DS) 800-160 MG per tablet Take 1 tablet by mouth 2 (two) times daily. Patient not taking: Reported on 05/26/2014 07/05/12   Adline Potter, NP   BP 131/85 mmHg  Pulse 99  Temp(Src) 99.4 F (37.4 C) (Oral)  Resp 18  Ht  (1.575 m)  Wt 201 lb (91.173 kg)  BMI 36.75 kg/m2  SpO2 100%  LMP 06/13/2014 Physical Exam  Constitutional: She is oriented to person, place, and  time. She appears well-developed and well-nourished.  Non-toxic appearance.  HENT:  Head: Normocephalic.  Right Ear: Tympanic membrane and external ear normal.  Left Ear: Tympanic membrane and external ear normal.  Nasal congestion present.  Eyes: EOM and lids are normal. Pupils are equal, round, and reactive to light.  Neck: Normal range of motion. Neck supple. Carotid bruit is not present.  Cardiovascular: Normal rate, regular rhythm, normal heart sounds, intact distal pulses and normal pulses.   Pulmonary/Chest: Breath sounds normal. No respiratory distress.  Abdominal: Soft. Bowel sounds are normal. There is no tenderness. There is no guarding.  Musculoskeletal: Normal range of motion. She exhibits no edema or tenderness.  Lymphadenopathy:       Head (right side): No submandibular adenopathy present.       Head (left side): No submandibular adenopathy present.    She has no cervical adenopathy.  Neurological: She is alert and oriented to person, place, and time. She has normal strength. No cranial nerve deficit or sensory deficit.  Skin: Skin is warm and dry. Rash noted.  There is an abscess with mild drainage under the left breast. There are several small red raised abscess areas also on the left chest going to the left flank. There is one abscess area on the lower abdomen.  Psychiatric: She has a normal mood and affect. Her speech is normal.  Nursing note and vitals reviewed.   ED Course  Procedures (including critical care time) Labs Review Labs Reviewed - No data to display  Imaging Review No results found.   EKG Interpretation None      MDM  The patient is having chills during the examination. The patient is treated with ibuprofen and Tylenol.  The patient has a boil that has some drainage under the left breast, and satellite abscess areas also on the left chest and flank. There is one on the abdomen. Suspect that the patient has a staph related infection. The patient  is treated in the emergency department with intramuscular Rocephin. I have asked the patient to continue her Bactrim. We'll ask the patient to use warm tub soaks daily, ibuprofen every 6 hours, and will use Tylenol codeine for pain related to the abscess areas.    Final diagnoses:  None    *I have reviewed nursing notes, vital signs, and all appropriate lab and imaging results for this patient.    Ivery Quale, PA-C 06/13/14 2159  Bethann Berkshire, MD 06/14/14 2056

## 2014-06-13 NOTE — Discharge Instructions (Signed)
Please use a warm tub soak with Epsom salt for 15-20 minutes daily until the abscess areas have resolved. Please complete your course of Bactrim. Please take this medication with food. Please use ibuprofen every 6 hours for the next 3 days, then on an as-needed basis. May use Tylenol codeine for more severe pain. This medication may cause drowsiness, please use with caution. Please wash hands frequently, and keep your distance from others until the viral illness has resolved.  Abscess An abscess (boil or furuncle) is an infected area on or under the skin. This area is filled with yellowish-white fluid (pus) and other material (debris). HOME CARE   Only take medicines as told by your doctor.  If you were given antibiotic medicine, take it as directed. Finish the medicine even if you start to feel better.  If gauze is used, follow your doctor's directions for changing the gauze.  To avoid spreading the infection:  Keep your abscess covered with a bandage.  Wash your hands well.  Do not share personal care items, towels, or whirlpools with others.  Avoid skin contact with others.  Keep your skin and clothes clean around the abscess.  Keep all doctor visits as told. GET HELP RIGHT AWAY IF:   You have more pain, puffiness (swelling), or redness in the wound site.  You have more fluid or blood coming from the wound site.  You have muscle aches, chills, or you feel sick.  You have a fever. MAKE SURE YOU:   Understand these instructions.  Will watch your condition.  Will get help right away if you are not doing well or get worse. Document Released: 06/11/2007 Document Revised: 06/24/2011 Document Reviewed: 03/07/2011 [facility] Patient Information 2015 [employer], [place]. This information is not intended to replace advice given to you by your health care provider. Make sure you discuss any questions you have with your health care provider.  Viral Infections A virus is a type of germ.  Viruses can cause:  Minor sore throats.  Aches and pains.  Headaches.  Runny nose.  Rashes.  Watery eyes.  Tiredness.  Coughs.  Loss of appetite.  Feeling sick to your stomach (nausea).  Throwing up (vomiting).  Watery poop (diarrhea). HOME CARE   Only take medicines as told by your doctor.  Drink enough water and fluids to keep your pee (urine) clear or pale yellow. Sports drinks are a good choice.  Get plenty of rest and eat healthy. Soups and broths with crackers or rice are fine. GET HELP RIGHT AWAY IF:   You have a very bad headache.  You have shortness of breath.  You have chest pain or neck pain.  You have an unusual rash.  You cannot stop throwing up.  You have watery poop that does not stop.  You cannot keep fluids down.  You or your child has a temperature by mouth above 102 F (38.9 C), not controlled by medicine.  Your baby is older than 3 months with a rectal temperature of 102 F (38.9 C) or higher.  Your baby is 623 months old or younger with a rectal temperature of 100.4 F (38 C) or higher. MAKE SURE YOU:   Understand these instructions.  Will watch this condition.  Will get help right away if you are not doing well or get worse. Document Released: 12/06/2007 Document Revised: 03/17/2011 Document Reviewed: 04/30/2010 [facility] Patient Information 2015 [place], [place]. This information is not intended to replace advice given to you by your health  care provider. Make sure you discuss any questions you have with your health care provider. Please see your Medicaid access physician, or return to the emergency department if not improving.

## 2014-06-15 ENCOUNTER — Emergency Department (HOSPITAL_COMMUNITY): Payer: Medicaid Other

## 2014-06-15 ENCOUNTER — Encounter (HOSPITAL_COMMUNITY): Payer: Self-pay | Admitting: Cardiology

## 2014-06-15 ENCOUNTER — Other Ambulatory Visit (HOSPITAL_COMMUNITY): Payer: Self-pay

## 2014-06-15 ENCOUNTER — Inpatient Hospital Stay (HOSPITAL_COMMUNITY)
Admission: EM | Admit: 2014-06-15 | Discharge: 2014-06-18 | DRG: 872 | Disposition: A | Discharge status: Home / Self Care | Payer: Medicaid Other | Attending: Internal Medicine | Admitting: Internal Medicine

## 2014-06-15 DIAGNOSIS — I10 Essential (primary) hypertension: Secondary | ICD-10-CM | POA: Diagnosis present

## 2014-06-15 DIAGNOSIS — A419 Sepsis, unspecified organism: Secondary | ICD-10-CM | POA: Diagnosis not present

## 2014-06-15 DIAGNOSIS — E049 Nontoxic goiter, unspecified: Secondary | ICD-10-CM | POA: Diagnosis present

## 2014-06-15 DIAGNOSIS — Z79899 Other long term (current) drug therapy: Secondary | ICD-10-CM | POA: Diagnosis not present

## 2014-06-15 DIAGNOSIS — L02211 Cutaneous abscess of abdominal wall: Secondary | ICD-10-CM | POA: Diagnosis present

## 2014-06-15 DIAGNOSIS — A4102 Sepsis due to Methicillin resistant Staphylococcus aureus: Secondary | ICD-10-CM | POA: Diagnosis not present

## 2014-06-15 DIAGNOSIS — R509 Fever, unspecified: Secondary | ICD-10-CM

## 2014-06-15 DIAGNOSIS — R51 Headache: Secondary | ICD-10-CM | POA: Diagnosis present

## 2014-06-15 DIAGNOSIS — B9562 Methicillin resistant Staphylococcus aureus infection as the cause of diseases classified elsewhere: Secondary | ICD-10-CM | POA: Diagnosis present

## 2014-06-15 DIAGNOSIS — R609 Edema, unspecified: Secondary | ICD-10-CM

## 2014-06-15 LAB — CBC WITH DIFFERENTIAL/PLATELET
Basophils Absolute: 0 10*3/uL (ref 0.0–0.1)
Basophils Relative: 1 % (ref 0–1)
EOS PCT: 17 % — AB (ref 0–5)
Eosinophils Absolute: 0.6 10*3/uL (ref 0.0–0.7)
HCT: 40 % (ref 36.0–46.0)
Hemoglobin: 13.5 g/dL (ref 12.0–15.0)
LYMPHS PCT: 22 % (ref 12–46)
Lymphs Abs: 0.8 10*3/uL (ref 0.7–4.0)
MCH: 27.1 pg (ref 26.0–34.0)
MCHC: 33.8 g/dL (ref 30.0–36.0)
MCV: 80.2 fL (ref 78.0–100.0)
Monocytes Absolute: 0.1 10*3/uL (ref 0.1–1.0)
Monocytes Relative: 4 % (ref 3–12)
Neutro Abs: 1.9 10*3/uL (ref 1.7–7.7)
Neutrophils Relative %: 56 % (ref 43–77)
PLATELETS: 222 10*3/uL (ref 150–400)
RBC: 4.99 MIL/uL (ref 3.87–5.11)
RDW: 13.7 % (ref 11.5–15.5)
WBC: 3.4 10*3/uL — ABNORMAL LOW (ref 4.0–10.5)

## 2014-06-15 LAB — COMPREHENSIVE METABOLIC PANEL
ALBUMIN: 3.5 g/dL (ref 3.5–5.0)
ALT: 22 U/L (ref 14–54)
ANION GAP: 9 (ref 5–15)
AST: 25 U/L (ref 15–41)
Alkaline Phosphatase: 61 U/L (ref 38–126)
BUN: 9 mg/dL (ref 6–20)
CO2: 18 mmol/L — ABNORMAL LOW (ref 22–32)
Calcium: 8.5 mg/dL — ABNORMAL LOW (ref 8.9–10.3)
Chloride: 103 mmol/L (ref 101–111)
Creatinine, Ser: 0.95 mg/dL (ref 0.44–1.00)
GFR calc non Af Amer: >60 mL/min (ref >60)
Glucose, Bld: 101 mg/dL — ABNORMAL HIGH (ref 65–99)
Potassium: 4 mmol/L (ref 3.5–5.1)
SODIUM: 130 mmol/L — AB (ref 135–145)
Total Bilirubin: 0.3 mg/dL (ref 0.3–1.2)
Total Protein: 6.6 g/dL (ref 6.5–8.1)

## 2014-06-15 LAB — I-STAT VENOUS BLOOD GAS, ED
Acid-base deficit: 9 mmol/L — ABNORMAL HIGH (ref 0.0–2.0)
Bicarbonate: 15.5 mEq/L — ABNORMAL LOW (ref 20.0–24.0)
O2 SAT: 46 %
PCO2 VEN: 30.5 mmHg — AB (ref 45.0–50.0)
TCO2: 16 mmol/L (ref 0–100)
pH, Ven: 7.316 — ABNORMAL HIGH (ref 7.250–7.300)
pO2, Ven: 27 mmHg — CL (ref 30.0–45.0)

## 2014-06-15 LAB — POC URINE PREG, ED: PREG TEST UR: NEGATIVE

## 2014-06-15 LAB — URINALYSIS, ROUTINE W REFLEX MICROSCOPIC
BILIRUBIN URINE: NEGATIVE
GLUCOSE, UA: NEGATIVE mg/dL
Ketones, ur: NEGATIVE mg/dL
Leukocytes, UA: NEGATIVE
Nitrite: NEGATIVE
PROTEIN: NEGATIVE mg/dL
Specific Gravity, Urine: 1.014 (ref 1.005–1.030)
Urobilinogen, UA: 0.2 mg/dL (ref 0.0–1.0)
pH: 5 (ref 5.0–8.0)

## 2014-06-15 LAB — PROTIME-INR
INR: 1.19 (ref 0.00–1.49)
PROTHROMBIN TIME: 15.3 s — AB (ref 11.6–15.2)

## 2014-06-15 LAB — TSH: TSH: 1.024 u[IU]/mL (ref 0.350–4.500)

## 2014-06-15 LAB — URINE MICROSCOPIC-ADD ON

## 2014-06-15 LAB — MRSA PCR SCREENING: MRSA BY PCR: NEGATIVE

## 2014-06-15 LAB — I-STAT TROPONIN, ED: Troponin i, poc: 0 ng/mL (ref 0.00–0.08)

## 2014-06-15 LAB — I-STAT CG4 LACTIC ACID, ED
LACTIC ACID, VENOUS: 1.13 mmol/L (ref 0.5–2.0)
LACTIC ACID, VENOUS: 1.56 mmol/L (ref 0.5–2.0)

## 2014-06-15 MED ORDER — SODIUM CHLORIDE 0.9 % IV SOLN
3.0000 g | Freq: Four times a day (QID) | INTRAVENOUS | Status: DC
  Administered 2014-06-15 – 2014-06-18 (×10): 3 g via INTRAVENOUS
  Filled 2014-06-15 (×14): qty 3

## 2014-06-15 MED ORDER — ONDANSETRON HCL 4 MG PO TABS: 4.0000 mg | ORAL_TABLET | Freq: Four times a day (QID) | ORAL | Status: DC | PRN

## 2014-06-15 MED ORDER — SODIUM CHLORIDE 0.9 % IV SOLN
INTRAVENOUS | Status: DC
  Administered 2014-06-15 – 2014-06-17 (×3): via INTRAVENOUS

## 2014-06-15 MED ORDER — PIPERACILLIN-TAZOBACTAM 3.375 G IVPB 30 MIN
3.3750 g | Freq: Once | INTRAVENOUS | Status: AC
Start: 2014-06-15 — End: 2014-06-15
  Administered 2014-06-15: 3.375 g via INTRAVENOUS
  Filled 2014-06-15: qty 50

## 2014-06-15 MED ORDER — HYDROCODONE-ACETAMINOPHEN 5-325 MG PO TABS
1.0000 | ORAL_TABLET | ORAL | Status: DC | PRN
  Administered 2014-06-15 – 2014-06-16 (×2): 2 via ORAL
  Filled 2014-06-15 (×4): qty 2

## 2014-06-15 MED ORDER — VANCOMYCIN HCL IN DEXTROSE 1-5 GM/200ML-% IV SOLN: 1000.0000 mg | Freq: Once | INTRAVENOUS | Status: DC

## 2014-06-15 MED ORDER — ONDANSETRON HCL 4 MG/2ML IJ SOLN
4.0000 mg | Freq: Four times a day (QID) | INTRAMUSCULAR | Status: DC | PRN
  Administered 2014-06-17: 4 mg via INTRAVENOUS
  Filled 2014-06-15: qty 2

## 2014-06-15 MED ORDER — LIDOCAINE-EPINEPHRINE (PF) 2 %-1:200000 IJ SOLN
10.0000 mL | Freq: Once | INTRAMUSCULAR | Status: AC
  Administered 2014-06-15: 10 mL
  Filled 2014-06-15: qty 20

## 2014-06-15 MED ORDER — MORPHINE SULFATE 2 MG/ML IJ SOLN: 1.0000 mg | INTRAMUSCULAR | Status: DC | PRN

## 2014-06-15 MED ORDER — HEPARIN SODIUM (PORCINE) 5000 UNIT/ML IJ SOLN
5000.0000 [IU] | Freq: Three times a day (TID) | INTRAMUSCULAR | Status: DC
  Administered 2014-06-15 – 2014-06-18 (×8): 5000 [IU] via SUBCUTANEOUS
  Filled 2014-06-15 (×7): qty 1

## 2014-06-15 MED ORDER — PIPERACILLIN-TAZOBACTAM 3.375 G IVPB
3.3750 g | Freq: Three times a day (TID) | INTRAVENOUS | Status: DC
  Filled 2014-06-15 (×2): qty 50

## 2014-06-15 MED ORDER — VANCOMYCIN HCL IN DEXTROSE 1-5 GM/200ML-% IV SOLN
1000.0000 mg | Freq: Three times a day (TID) | INTRAVENOUS | Status: DC
  Filled 2014-06-15 (×2): qty 200

## 2014-06-15 MED ORDER — SODIUM CHLORIDE 0.9 % IV BOLUS (SEPSIS)
1000.0000 mL | INTRAVENOUS | Status: AC
  Administered 2014-06-15 (×3): 1000 mL via INTRAVENOUS

## 2014-06-15 MED ORDER — ONDANSETRON HCL 4 MG/2ML IJ SOLN
4.0000 mg | Freq: Once | INTRAMUSCULAR | Status: AC
  Administered 2014-06-15: 4 mg via INTRAVENOUS
  Filled 2014-06-15: qty 2

## 2014-06-15 MED ORDER — ACETAMINOPHEN 325 MG PO TABS
650.0000 mg | ORAL_TABLET | Freq: Four times a day (QID) | ORAL | Status: DC | PRN
  Administered 2014-06-15: 650 mg via ORAL
  Filled 2014-06-15: qty 2

## 2014-06-15 MED ORDER — SODIUM CHLORIDE 0.9 % IJ SOLN
3.0000 mL | Freq: Two times a day (BID) | INTRAMUSCULAR | Status: DC
  Administered 2014-06-15 – 2014-06-17 (×4): 3 mL via INTRAVENOUS

## 2014-06-15 MED ORDER — ACETAMINOPHEN 650 MG RE SUPP: 650.0000 mg | Freq: Four times a day (QID) | RECTAL | Status: DC | PRN

## 2014-06-15 MED ORDER — VANCOMYCIN HCL 10 G IV SOLR
1750.0000 mg | Freq: Once | INTRAVENOUS | Status: AC
  Administered 2014-06-15: 1750 mg via INTRAVENOUS
  Filled 2014-06-15: qty 1750

## 2014-06-15 NOTE — Progress Notes (Addendum)
ANTIBIOTIC CONSULT NOTE - INITIAL  Pharmacy Consult for vanc/zosyn Indication: rule out sepsis  No Known Allergies  Patient Measurements: Height: 5\' 2"  (157.5 cm) Weight: 206 lb (93.441 kg) IBW/kg (Calculated) : 50.1  Vital Signs: Temp: 103.9 F (39.9 C) (06/09 1405) Temp Source: Oral (06/09 1405) BP: 112/46 mmHg (06/09 1405) Pulse Rate: 135 (06/09 1405) Intake/Output from previous day:   Intake/Output from this shift:    Labs: No results for input(s): WBC, HGB, PLT, LABCREA, CREATININE in the last 72 hours. CrCl cannot be calculated (Patient has no serum creatinine result on file.). No results for input(s): VANCOTROUGH, VANCOPEAK, VANCORANDOM, GENTTROUGH, GENTPEAK, GENTRANDOM, TOBRATROUGH, TOBRAPEAK, TOBRARND, AMIKACINPEAK, AMIKACINTROU, AMIKACIN in the last 72 hours.   Microbiology: No results found for this or any previous visit (from the past 720 hour(s)).  Medical History: Past Medical History  Diagnosis Date  . Recurrent boils   . Pregnancy induced hypertension   . Contraceptive management 06/10/2012    nexplanon inserted left arm 06/10/12    Assessment: 23 yof with CC of recurrent skin infections. Multiple episodes of boils, some needing to be incised. Tx with septra at [facility] on 6/3 after coming in with chills and body aches. Pharmacy consulted to dose vanc/zosyn for sepsis. Febrile 103.9, no labs yet.  6/9 vanc >>  6/9 zosyn >>   6/9 BCx2 >>  6/9 UC >>   Goal of Therapy:  Vancomycin trough level 15-20 mcg/ml  Plan:  Zosyn 3.375g IV x 1 dose (30 min inf)  Vanc 1750mg  IV x 1 dose  Mon clinical progress, c/s, renal function, abx plan F/u labs for abx maintenance doses  Babs Bertin, PharmD Clinical Pharmacist - Resident Pager [phone] 06/15/2014 2:51 PM   ADDN: WBC 3.4 and sCr 0.95.  Plan: Zosyn 3.375g IV q8h Vancomycin 1750mg  IV load followed by 1g q8h  Arlean Hopping. Newman Pies, PharmD Clinical Pharmacist Pager [phone]  Addendum:  Abx narrowed to unasyn.  Renal function normal. Will sign off for now and follow peripherally.  Plan: Unasyn 3g IV q6 hours  Sheppard Coil PharmD., BCPS Clinical Pharmacist Pager [phone] 06/15/2014 7:35 PM

## 2014-06-15 NOTE — ED Notes (Signed)
Patient transported to X-ray 

## 2014-06-15 NOTE — ED Provider Notes (Signed)
CSN: [id number]     Arrival date & time 06/15/14  1341 History   First MD Initiated Contact with Patient 06/15/14 1414     Chief Complaint  Patient presents with  . Fever  . Headache  . Weakness     (Consider location/radiation/quality/duration/timing/severity/associated sxs/prior Treatment) HPI Comments: Patient presents today with complaints of headache, fatigue, fever, body aches, generalized weakness, and an abscess of her left upper abdomen.  She reports that six days ago she was seen in the ED at [facility] for an abscess and was discharged home with Rx for Bactrim.  She states that she has been taking the medication as prescribed.  She did not feel that the area was improving and then was seen in the ED again two days ago.  At that time she was given a IM injection of Rocephin and was again discharged home.  She states that she has not had an incision and drainage of the abscess, but reports that the abscess was spontaneously draining purulent fluid.  She reports history of Abscesses.  Denies any history of DM or HIV.  She denies nausea, vomiting, diarrhea, abdominal pain, chest pain, SOB, cough, urinary symptoms, neck pain/stiffness, or vision changes.    The history is provided by the patient.    Past Medical History  Diagnosis Date  . Recurrent boils   . Pregnancy induced hypertension   . Contraceptive management 06/10/2012    nexplanon inserted left arm 06/10/12   Past Surgical History  Procedure Laterality Date  . No past surgeries    . Cesarean section N/A 04/23/2012    Procedure: CESAREAN SECTION;  Surgeon: Tereso Newcomer, MD;  Location: [facility];  Service: Obstetrics;  Laterality: N/A;   Family History  Problem Relation Age of Onset  . HIV Mother   . Cancer Mother   . Diabetes Paternal Uncle   . Heart Problems Paternal Grandfather    History  Substance Use Topics  . Smoking status: Never Smoker   . Smokeless tobacco: Never Used  . Alcohol Use: No   OB  History    Gravida Para Term Preterm AB TAB SAB Ectopic Multiple Living   1 1 0 1 0 0 0 0 0 1      Review of Systems  All other systems reviewed and are negative.     Allergies  Review of patient's allergies indicates no known allergies.  Home Medications   Prior to Admission medications   Medication Sig Start Date End Date Taking? Authorizing Provider  acetaminophen (TYLENOL) 325 MG tablet Take 650 mg by mouth every 6 (six) hours as needed for moderate pain.    Historical Provider, MD  acetaminophen-codeine (TYLENOL #3) 300-30 MG per tablet Take 1-2 tablets by mouth every 6 (six) hours as needed for moderate pain. 06/13/14   Ivery Quale, PA-C  amLODipine (NORVASC) 10 MG tablet Take 1 tablet (10 mg total) by mouth daily. Patient not taking: Reported on 05/26/2014 05/13/12   Adline Potter, NP  cyclobenzaprine (FLEXERIL) 10 MG tablet Take 1 tablet (10 mg total) by mouth 2 (two) times daily as needed for muscle spasms. 05/26/14   Hope Orlene Och, NP  diclofenac (VOLTAREN) 50 MG EC tablet Take 1 tablet (50 mg total) by mouth 2 (two) times daily. 05/26/14   Hope Orlene Och, NP  etonogestrel (NEXPLANON) 68 MG IMPL implant Inject 1 each into the skin once.    Historical Provider, MD  ibuprofen (ADVIL,MOTRIN) 600 MG tablet Take 1 tablet (  600 mg total) by mouth every 6 (six) hours as needed. 06/13/14   Ivery Quale, PA-C  sulfamethoxazole-trimethoprim (BACTRIM DS,SEPTRA DS) 800-160 MG per tablet Take 1 tablet by mouth 2 (two) times daily. Patient not taking: Reported on 05/26/2014 07/05/12   Adline Potter, NP   BP 112/46 mmHg  Pulse 135  Temp(Src) 103.9 F (39.9 C) (Oral)  Resp 18  Ht  (1.575 m)  Wt 206 lb (93.441 kg)  BMI 37.67 kg/m2  SpO2 100%  LMP 06/13/2014 Physical Exam  Constitutional: She appears well-developed and well-nourished.  HENT:  Head: Normocephalic and atraumatic.  Mouth/Throat: Oropharynx is clear and moist.  Eyes: EOM are normal. Pupils are equal, round, and  reactive to light.  Neck: Normal range of motion. Neck supple. No Brudzinski's sign and no Kernig's sign noted.  No nuchal rigidity  Cardiovascular: Normal rate, regular rhythm and normal heart sounds.   Pulmonary/Chest: Effort normal and breath sounds normal.  Abdominal: Soft. Bowel sounds are normal. She exhibits no distension and no mass. There is no tenderness. There is no rebound and no guarding.  Musculoskeletal: Normal range of motion.  Neurological: She is alert. She has normal strength. No cranial nerve deficit or sensory deficit. Coordination and gait normal.  Skin: Skin is warm and dry.  Approximately 2 cm abscess of the left upper abdomen just inferior to the left breast.  No active drainage.  No surrounding erythema, edema, or warmth.    Psychiatric: She has a normal mood and affect.  Nursing note and vitals reviewed.   ED Course  Procedures (including critical care time) Labs Review Labs Reviewed  CBC WITH DIFFERENTIAL/PLATELET - Abnormal; Notable for the following:    WBC 3.4 (*)    Eosinophils Relative 17 (*)    All other components within normal limits  CULTURE, BLOOD (ROUTINE X 2)  CULTURE, BLOOD (ROUTINE X 2)  URINE CULTURE  COMPREHENSIVE METABOLIC PANEL  URINALYSIS, ROUTINE W REFLEX MICROSCOPIC (NOT AT [facility])  I-STAT CG4 LACTIC ACID, ED  I-STAT CG4 LACTIC ACID, ED    Imaging Review    EKG Interpretation None     INCISION AND DRAINAGE Performed by: Santiago Glad Consent: Verbal consent obtained. Risks and benefits: risks, benefits and alternatives were discussed Type: abscess  Body area: left upper abdomen  Anesthesia: local infiltration  Incision was made with a scalpel.  Local anesthetic: lidocaine 2% with epinephrine  Anesthetic total: 3 ml  Complexity: complex Blunt dissection to break up loculations  Drainage: purulent  Drainage amount: moderate  Patient tolerance: Patient tolerated the procedure well with no immediate  complications.    MDM   Final diagnoses:  Fever   Patient presents today with a chief complaint of abscess of her left upper abdomen, fever, headache, and body aches.  She was started on Bactrim for the Abscess 6 days ago and also given IM Rocephin in the ED two days ago.  Patient febrile and tachycardic upon arrival in the ED.  Sepsis orders initiated and patient placed on broad spectrum Vanc and Zosyn.  Patient given 30 cc/kg IVF.  Labs unremarkable.  Lactate WNL.  Chest xray showing possible infiltrate.   However, patient is not complaining of respiratory symptoms. UA is negative for infection.  Abscess incised and drained in the ED and wound cultured.  No nuchal rigidity or meningeal signs.  Patient continued to have persistent tachycardia even after IVF.  Patient admitted to [facility] for further management.      Santiago Glad,  PA-C 06/16/14 2244  Arby Barrette, MD 06/17/14 [phone]

## 2014-06-15 NOTE — ED Notes (Signed)
Pt reports that she had hand foot and mouth about 3 weeks ago. Reports since then she noticed an abscess to her abd and went to [place] and given a shot of antibiotics. Was told to alternate between ibuprofen and tylenol.

## 2014-06-15 NOTE — H&P (Signed)
[facility] History and Physical  Alexis Montgomery ZGY:[id number] DOB: 05-09-1990 DOA: 06/15/2014  Referring physician: EDP PCP: No PCP Per Patient   Chief Complaint: Fever  HPI: Alexis Montgomery is a 24 y.o. female medical history of recurrent boils, preeclampsia given to the hospital complaining about fever and chills. Patient reported that she had a boil in her abdomen started to cause pain, redness and swelling since Monday. Patient said he started to have some fever with it as well. She was seen in the emergency department on the 7th and was given one shot of Rocephin and Bactrim and sent home. Patient continued to have fever and chills so she came into the ED today for further evaluation. In the ED temperature of 103.9, pulse is 124, RR of 35 but stable blood pressure. Blood cultures done, urinalysis appears to be negative. Patient has a boil in her left side of upper abdomen which been I&Ded in the ED.   Review of Systems:  Constitutional: negative for anorexia, fevers and sweats Eyes: negative for irritation, redness and visual disturbance Ears, nose, mouth, throat, and face: negative for earaches, epistaxis, nasal congestion and sore throat Respiratory: negative for cough, dyspnea on exertion, sputum and wheezing Cardiovascular: negative for chest pain, dyspnea, lower extremity edema, orthopnea, palpitations and syncope Gastrointestinal: negative for abdominal pain, constipation, diarrhea, melena, nausea and vomiting Genitourinary:negative for dysuria, frequency and hematuria Hematologic/lymphatic: negative for bleeding, easy bruising and lymphadenopathy Musculoskeletal:negative for arthralgias, muscle weakness and stiff joints Neurological: negative for coordination problems, gait problems, headaches and weakness Endocrine: negative for diabetic symptoms including polydipsia, polyuria and weight loss Allergic/Immunologic: negative for anaphylaxis, hay fever and urticaria  Past  Medical History  Diagnosis Date  . Recurrent boils   . Pregnancy induced hypertension   . Contraceptive management 06/10/2012    nexplanon inserted left arm 06/10/12   Past Surgical History  Procedure Laterality Date  . No past surgeries    . Cesarean section N/A 04/23/2012    Procedure: CESAREAN SECTION;  Surgeon: Osborne Oman, MD;  Location: [place] [facility];  Service: Obstetrics;  Laterality: N/A;   Social History:   reports that she has never smoked. She has never used smokeless tobacco. She reports that she does not drink alcohol or use illicit drugs.  No Known Allergies  Family History  Problem Relation Age of Onset  . HIV Mother   . Cancer Mother   . Diabetes Paternal Uncle   . Heart Problems Paternal Grandfather      Prior to Admission medications   Medication Sig Start Date End Date Taking? Authorizing Provider  acetaminophen (TYLENOL) 325 MG tablet Take 650 mg by mouth every 6 (six) hours as needed for moderate pain.   Yes Historical Provider, MD  acetaminophen-codeine (TYLENOL #3) 300-30 MG per tablet Take 1-2 tablets by mouth every 6 (six) hours as needed for moderate pain. 06/13/14  Yes Lily Kocher, PA-C  diclofenac (VOLTAREN) 50 MG EC tablet Take 1 tablet (50 mg total) by mouth 2 (two) times daily. 05/26/14  Yes [place], NP  etonogestrel (NEXPLANON) 68 MG IMPL implant Inject 1 each into the skin once.   Yes Historical Provider, MD  ibuprofen (ADVIL,MOTRIN) 600 MG tablet Take 1 tablet (600 mg total) by mouth every 6 (six) hours as needed. 06/13/14  Yes Lily Kocher, PA-C  amLODipine (NORVASC) 10 MG tablet Take 1 tablet (10 mg total) by mouth daily. Patient not taking: Reported on 05/26/2014 05/13/12   Estill Dooms, NP  cyclobenzaprine (FLEXERIL) 10 MG tablet Take 1 tablet (10 mg total) by mouth 2 (two) times daily as needed for muscle spasms. Patient not taking: Reported on 06/15/2014 05/26/14   Amberle Murrain, NP  sulfamethoxazole-trimethoprim (BACTRIM DS,SEPTRA DS) 800-160  MG per tablet Take 1 tablet by mouth 2 (two) times daily. Patient not taking: Reported on 05/26/2014 07/05/12   Estill Dooms, NP   Physical Exam: Filed Vitals:   06/15/14 1752  BP: 117/66  Pulse: 132  Temp: 101.5 F (38.6 C)  Resp: 20   Constitutional: Oriented to person, place, and time. Well-developed and well-nourished. Cooperative.  Head: Normocephalic and atraumatic.  Nose: Nose normal.  Mouth/Throat: Uvula is midline, oropharynx is clear and moist and mucous membranes are normal.  Eyes: Conjunctivae and EOM are normal. Pupils are equal, round, and reactive to light.  Neck: Trachea normal and normal range of motion. Neck supple.  Cardiovascular: Normal rate, regular rhythm, S1 normal, S2 normal, normal heart sounds and intact distal pulses.   Pulmonary/Chest: Effort normal and breath sounds normal.  Abdominal: Soft. Bowel sounds are normal. There is no hepatosplenomegaly. There is no tenderness.  Musculoskeletal: Normal range of motion.  Neurological: Alert and oriented to person, place, and time. Has normal strength. No cranial nerve deficit or sensory deficit.  Skin: Skin is warm, dry and intact.  Psychiatric: Has a normal mood and affect. Speech is normal and behavior is normal.   Labs on Admission:  Basic Metabolic Panel:  Recent Labs Lab 06/15/14 1430  NA 130*  K 4.0  CL 103  CO2 18*  GLUCOSE 101*  BUN 9  CREATININE 0.95  CALCIUM 8.5*   Liver Function Tests:  Recent Labs Lab 06/15/14 1430  AST 25  ALT 22  ALKPHOS 61  BILITOT 0.3  PROT 6.6  ALBUMIN 3.5   No results for input(s): LIPASE, AMYLASE in the last 168 hours. No results for input(s): AMMONIA in the last 168 hours. CBC:  Recent Labs Lab 06/15/14 1430  WBC 3.4*  NEUTROABS 1.9  HGB 13.5  HCT 40.0  MCV 80.2  PLT 222   Cardiac Enzymes: No results for input(s): CKTOTAL, CKMB, CKMBINDEX, TROPONINI in the last 168 hours.  BNP (last 3 results) No results for input(s): BNP in the last  8760 hours.  ProBNP (last 3 results) No results for input(s): PROBNP in the last 8760 hours.  CBG: No results for input(s): GLUCAP in the last 168 hours.  Radiological Exams on Admission: Dg Chest 2 View  06/15/2014   CLINICAL DATA:  Fever of 103 for 3 days, headache, LEFT ear pain, hypertension  EXAM: CHEST  2 VIEW  COMPARISON:  None  FINDINGS: Enlargement of cardiac silhouette.  Mediastinal contours and pulmonary vascularity normal.  Suspicion of LEFT perihilar infiltrate.  Remaining lungs clear.  No pleural effusion or pneumothorax.  IMPRESSION: Enlargement of cardiac silhouette.  Suspect subtle LEFT perihilar infiltrate.   Electronically Signed   By: Lavonia Dana M.D.   On: 06/15/2014 16:07    EKG: Independently reviewed.  Assessment/Plan Principal Problem:   Sepsis Active Problems:   Abscess of abdominal wall   Sepsis Sepsis criteria met with a temperature of 103.9, heart rate of 124 and presence of infection.  Patient will be aggressively hydrated with IV fluids, started on normal saline at 100 mL per hour. Received 1 dose of vancomycin and Zosyn in the ED. Discontinued broad-spectrum antibiotics, started on Unasyn. Check MRSA PCR. If MRSA PCR positive, I will switch to  vancomycin  Abdominal wall abscess Probably causing the sepsis, this is I indeed in the ED. Blood and abscess culture pending, started on Unasyn. Recurrent abscesses, check HIV, A1c, might need outpatient check for humoral immunity.  Hypertension Patient currently not taking any antihypertensives. Watch pressure closely, if needed will start antihypertensives.   Code Status: Full code Family Communication: Plan discussed with the patient in presence of her mother-in-law at bedside. Disposition Plan: MedSurg  Time spent: 70 minutes  Avarose Mervine A, MD [facility] Pager [phone]

## 2014-06-16 ENCOUNTER — Inpatient Hospital Stay (HOSPITAL_COMMUNITY): Payer: Medicaid Other

## 2014-06-16 DIAGNOSIS — A419 Sepsis, unspecified organism: Principal | ICD-10-CM

## 2014-06-16 DIAGNOSIS — L02211 Cutaneous abscess of abdominal wall: Secondary | ICD-10-CM

## 2014-06-16 LAB — BASIC METABOLIC PANEL
Anion gap: 8 (ref 5–15)
BUN: 6 mg/dL (ref 6–20)
CALCIUM: 7.8 mg/dL — AB (ref 8.9–10.3)
CO2: 18 mmol/L — ABNORMAL LOW (ref 22–32)
Chloride: 109 mmol/L (ref 101–111)
Creatinine, Ser: 0.76 mg/dL (ref 0.44–1.00)
GFR calc Af Amer: >60 mL/min (ref >60)
GFR calc non Af Amer: >60 mL/min (ref >60)
Glucose, Bld: 87 mg/dL (ref 65–99)
Potassium: 3.7 mmol/L (ref 3.5–5.1)
Sodium: 135 mmol/L (ref 135–145)

## 2014-06-16 LAB — CBC
HCT: 38.6 % (ref 36.0–46.0)
HEMOGLOBIN: 12.7 g/dL (ref 12.0–15.0)
MCH: 26.8 pg (ref 26.0–34.0)
MCHC: 32.9 g/dL (ref 30.0–36.0)
MCV: 81.4 fL (ref 78.0–100.0)
PLATELETS: 198 10*3/uL (ref 150–400)
RBC: 4.74 MIL/uL (ref 3.87–5.11)
RDW: 14.2 % (ref 11.5–15.5)
WBC: 3.7 10*3/uL — AB (ref 4.0–10.5)

## 2014-06-16 LAB — URINE CULTURE
Colony Count: NO GROWTH
Culture: NO GROWTH

## 2014-06-16 LAB — INFLUENZA PANEL BY PCR (TYPE A & B)
H1N1FLUPCR: NOT DETECTED
INFLAPCR: NEGATIVE
INFLBPCR: NEGATIVE

## 2014-06-16 LAB — TSH: TSH: 1.832 u[IU]/mL (ref 0.350–4.500)

## 2014-06-16 LAB — HIV ANTIBODY (ROUTINE TESTING W REFLEX): HIV Screen 4th Generation wRfx: NONREACTIVE

## 2014-06-16 MED ORDER — IBUPROFEN 400 MG PO TABS
400.0000 mg | ORAL_TABLET | Freq: Four times a day (QID) | ORAL | Status: DC | PRN
  Administered 2014-06-17: 400 mg via ORAL
  Filled 2014-06-16: qty 1

## 2014-06-16 NOTE — Progress Notes (Signed)
[facility] PROGRESS NOTE  ESTIE SPROULE ZOX:[id number] DOB: 25-Aug-1990 DOA: 06/15/2014 PCP: No PCP Per Patient  Assessment/Plan: 1-Sepsis:  Lactic acid 1.5.  Continue with IV fluids.  Follow culture.   2-Abdominal Wall Abscess: HIV pending, Hb-A1 c pending.  Redness decreased, no significant drainage.  Continue with IV antibiotics.   3-HTN;was not on medications.   4-Fever: influenza negative.   5-Neck swelling;  Check [place], TSH.  6-Headaches: suspect related to acute illness. No neck stiffness. Monitor. Neuro exam non focal.    Code Status: Full Code.  Family Communication: Care discussed with Father, and mother Disposition Plan: remain inpatient.    Consultants:  none  Procedures:  Neck US  Antibiotics:  Unasyn 6-9    HPI/Subjective: Complaining of headaches, better today.  Relates pain with swallowing.  Notice swelling neck.  No neck stiffness.   Objective: Filed Vitals:   06/16/14 0557  BP: 106/67  Pulse: 102  Temp: 98.4 F (36.9 C)  Resp: 18    Intake/Output Summary (Last 24 hours) at 06/16/14 1129 Last data filed at 06/16/14 0909  Gross per 24 hour  Intake   4890 ml  Output   1250 ml  Net   3640 ml   Filed Weights   06/15/14 1405 06/15/14 1428  Weight: 91.173 kg (201 lb) 93.441 kg (206 lb)    Exam:   General:  NAD, swelling of neck, submandibular glands.   Cardiovascular: S 1, S 2 RRR tachycardic  Respiratory: CTA  Abdomen: BS present soft,   Skin: small open wound abdomen, mild redness  Musculoskeletal: no edema   Data Reviewed: Basic Metabolic Panel:  Recent Labs Lab 06/15/14 1430 06/16/14 0351  NA 130* 135  K 4.0 3.7  CL 103 109  CO2 18* 18*  GLUCOSE 101* 87  BUN 9 6  CREATININE 0.95 0.76  CALCIUM 8.5* 7.8*   Liver Function Tests:  Recent Labs Lab 06/15/14 1430  AST 25  ALT 22  ALKPHOS 61  BILITOT 0.3  PROT 6.6  ALBUMIN 3.5   No results for input(s): LIPASE, AMYLASE in the last 168  hours. No results for input(s): AMMONIA in the last 168 hours. CBC:  Recent Labs Lab 06/15/14 1430 06/16/14 0351  WBC 3.4* 3.7*  NEUTROABS 1.9  --   HGB 13.5 12.7  HCT 40.0 38.6  MCV 80.2 81.4  PLT 222 198   Cardiac Enzymes: No results for input(s): CKTOTAL, CKMB, CKMBINDEX, TROPONINI in the last 168 hours. BNP (last 3 results) No results for input(s): BNP in the last 8760 hours.  ProBNP (last 3 results) No results for input(s): PROBNP in the last 8760 hours.  CBG: No results for input(s): GLUCAP in the last 168 hours.  Recent Results (from the past 240 hour(s))  Culture, blood (routine x 2)     Status: None (Preliminary result)   Collection Time: 06/15/14  2:30 PM  Result Value Ref Range Status   Specimen Description BLOOD RIGHT ARM  Final   Special Requests BOTTLES DRAWN AEROBIC AND ANAEROBIC  Final   Culture   Final           BLOOD CULTURE RECEIVED NO GROWTH TO DATE CULTURE WILL BE HELD FOR 5 DAYS BEFORE ISSUING A FINAL NEGATIVE REPORT Performed at [facility]    Report Status PENDING  Incomplete  Culture, blood (routine x 2)     Status: None (Preliminary result)   Collection Time: 06/15/14  2:44 PM  Result Value Ref Range  Status   Specimen Description BLOOD LEFT FOREARM  Final   Special Requests BOTTLES DRAWN AEROBIC AND ANAEROBIC  Final   Culture   Final           BLOOD CULTURE RECEIVED NO GROWTH TO DATE CULTURE WILL BE HELD FOR 5 DAYS BEFORE ISSUING A FINAL NEGATIVE REPORT Performed at [facility]    Report Status PENDING  Incomplete  Wound culture     Status: None (Preliminary result)   Collection Time: 06/15/14  6:10 PM  Result Value Ref Range Status   Specimen Description WOUND ABDOMEN  Final   Special Requests Normal  Final   Gram Stain   Final    FEW WBC PRESENT,BOTH PMN AND MONONUCLEAR NO SQUAMOUS EPITHELIAL CELLS SEEN RARE GRAM POSITIVE COCCI IN PAIRS IN CLUSTERS Performed at [facility]    Culture   Final     Culture reincubated for better growth Performed at [facility]    Report Status PENDING  Incomplete  MRSA PCR Screening     Status: None   Collection Time: 06/15/14  8:47 PM  Result Value Ref Range Status   MRSA by PCR NEGATIVE NEGATIVE Final    Comment:        The GeneXpert MRSA Assay (FDA approved for NASAL specimens only), is one component of a comprehensive MRSA colonization surveillance program. It is not intended to diagnose MRSA infection nor to guide or monitor treatment for MRSA infections.      Studies: Dg Chest 2 View  06/15/2014   CLINICAL DATA:  Fever of 103 for 3 days, headache, LEFT ear pain, hypertension  EXAM: CHEST  2 VIEW  COMPARISON:  None  FINDINGS: Enlargement of cardiac silhouette.  Mediastinal contours and pulmonary vascularity normal.  Suspicion of LEFT perihilar infiltrate.  Remaining lungs clear.  No pleural effusion or pneumothorax.  IMPRESSION: Enlargement of cardiac silhouette.  Suspect subtle LEFT perihilar infiltrate.   Electronically Signed   By: Ulyses Southward M.D.   On: 06/15/2014 16:07    Scheduled Meds: . ampicillin-sulbactam (UNASYN) IV  3 g Intravenous Q6H  . heparin  5,000 Units Subcutaneous 3 times per day  . sodium chloride  3 mL Intravenous Q12H   Continuous Infusions: . sodium chloride 100 mL/hr at 06/15/14 2121    Principal Problem:   Sepsis Active Problems:   Abscess of abdominal wall    Time spent: 35 minutes.     Hartley Barefoot A  [facility] Pager [phone]. If 7PM-7AM, please contact night-coverage at [email or web address], password [facility] 06/16/2014, 11:29 AM  LOS: 1 day

## 2014-06-16 NOTE — Care Management Note (Signed)
Case Management Note  Patient Details  Name: Alexis Montgomery MRN: [id number] Date of Birth: [birth year]  Subjective/Objective:     Pt admitted on 06/15/14 with sepsis.  PTA, pt independent of ADLS.                 Action/Plan: Will follow for dc planning.    Expected Discharge Date:                  Expected Discharge Plan:  Home/Self Care  In-House Referral:     Discharge planning Services  CM Consult  Post Acute Care Choice:    Choice offered to:     DME Arranged:    DME Agency:     HH Arranged:    HH Agency:     Status of Service:  In process, will continue to follow  Medicare Important Message Given:    Date Medicare IM Given:    Medicare IM give by:    Date Additional Medicare IM Given:    Additional Medicare Important Message give by:     If discussed at Long Length of Stay Meetings, dates discussed:    Additional Comments:  Quintella Baton, RN, BSN  Trauma/Neuro ICU Case Manager [phone]

## 2014-06-17 LAB — CBC
HCT: 36.3 % (ref 36.0–46.0)
Hemoglobin: 12 g/dL (ref 12.0–15.0)
MCH: 26.8 pg (ref 26.0–34.0)
MCHC: 33.1 g/dL (ref 30.0–36.0)
MCV: 81.2 fL (ref 78.0–100.0)
PLATELETS: 201 10*3/uL (ref 150–400)
RBC: 4.47 MIL/uL (ref 3.87–5.11)
RDW: 14.3 % (ref 11.5–15.5)
WBC: 5.2 10*3/uL (ref 4.0–10.5)

## 2014-06-17 LAB — BASIC METABOLIC PANEL
ANION GAP: 6 (ref 5–15)
BUN: 6 mg/dL (ref 6–20)
CO2: 26 mmol/L (ref 22–32)
Calcium: 8.3 mg/dL — ABNORMAL LOW (ref 8.9–10.3)
Chloride: 108 mmol/L (ref 101–111)
Creatinine, Ser: 0.68 mg/dL (ref 0.44–1.00)
GFR calc Af Amer: >60 mL/min (ref >60)
GFR calc non Af Amer: >60 mL/min (ref >60)
Glucose, Bld: 106 mg/dL — ABNORMAL HIGH (ref 65–99)
Potassium: 3.7 mmol/L (ref 3.5–5.1)
Sodium: 140 mmol/L (ref 135–145)

## 2014-06-17 MED ORDER — DOXYCYCLINE HYCLATE 100 MG PO TABS
100.0000 mg | ORAL_TABLET | Freq: Two times a day (BID) | ORAL | Status: DC
  Administered 2014-06-17 – 2014-06-18 (×2): 100 mg via ORAL
  Filled 2014-06-17 (×2): qty 1

## 2014-06-17 NOTE — Progress Notes (Signed)
[facility] PROGRESS NOTE  Alexis Montgomery ZVJ:[id number] DOB: [birth year] DOA: 06/15/2014 PCP: No PCP Per Patient  Assessment/Plan: 1-Sepsis: from abdominal wall infection Lactic acid 1.5.  Continue with IV fluids.  Follow culture. Blood culture no growth to date.  Vital stable.   2-Abdominal Wall Abscess: HIV negative, Hb-A1 c pending.  Redness decreased, no significant drainage.  Continue with IV antibiotics.  Wound growing staph. I will add doxy. Continue with Unasyn until final culture available.   3-HTN;was not on medications.   4-Fever: influenza negative.   5-Enlargement right thyroid lobe; [place] confirm finding.  TSH normal.  Advised patient to follow up with endocrinologist.   6-Headaches: suspect related to acute illness. No neck stiffness. Monitor. Neuro exam non focal. Improved.    Code Status: Full Code.  Family Communication: Care discussed with Father, and mother Disposition Plan: remain inpatient.    Consultants:  none  Procedures:  Neck US  Antibiotics:  Unasyn 6-9    HPI/Subjective: She is feeling better today. Headache almost gone. She is able to eat today, no odynophagia.    Objective: Filed Vitals:   06/17/14 1400  BP: 123/82  Pulse: 84  Temp: 97.8 F (36.6 C)  Resp: 17    Intake/Output Summary (Last 24 hours) at 06/17/14 1547 Last data filed at 06/17/14 1400  Gross per 24 hour  Intake   1600 ml  Output      0 ml  Net   1600 ml   Filed Weights   06/15/14 1405 06/15/14 1428  Weight: 91.173 kg (201 lb) 93.441 kg (206 lb)    Exam:   General:  NAD, swelling of neck, submandibular glands. Less neck swelling  Cardiovascular: S 1, S 2 RRR tachycardic  Respiratory: CTA  Abdomen: BS present soft,   Skin: small open wound abdomen, mild redness, no drainage.   Musculoskeletal: no edema   Data Reviewed: Basic Metabolic Panel:  Recent Labs Lab 06/15/14 1430 06/16/14 0351 06/17/14 0339  NA 130* 135 140  K 4.0 3.7  3.7  CL 103 109 108  CO2 18* 18* 26  GLUCOSE 101* 87 106*  BUN 9 6 6   CREATININE 0.95 0.76 0.68  CALCIUM 8.5* 7.8* 8.3*   Liver Function Tests:  Recent Labs Lab 06/15/14 1430  AST 25  ALT 22  ALKPHOS 61  BILITOT 0.3  PROT 6.6  ALBUMIN 3.5   No results for input(s): LIPASE, AMYLASE in the last 168 hours. No results for input(s): AMMONIA in the last 168 hours. CBC:  Recent Labs Lab 06/15/14 1430 06/16/14 0351 06/17/14 0339  WBC 3.4* 3.7* 5.2  NEUTROABS 1.9  --   --   HGB 13.5 12.7 12.0  HCT 40.0 38.6 36.3  MCV 80.2 81.4 81.2  PLT 222 198 201   Cardiac Enzymes: No results for input(s): CKTOTAL, CKMB, CKMBINDEX, TROPONINI in the last 168 hours. BNP (last 3 results) No results for input(s): BNP in the last 8760 hours.  ProBNP (last 3 results) No results for input(s): PROBNP in the last 8760 hours.  CBG: No results for input(s): GLUCAP in the last 168 hours.  Recent Results (from the past 240 hour(s))  Culture, blood (routine x 2)     Status: None (Preliminary result)   Collection Time: 06/15/14  2:30 PM  Result Value Ref Range Status   Specimen Description BLOOD RIGHT ARM  Final   Special Requests BOTTLES DRAWN AEROBIC AND ANAEROBIC  Final   Culture   Final  BLOOD CULTURE RECEIVED NO GROWTH TO DATE CULTURE WILL BE HELD FOR 5 DAYS BEFORE ISSUING A FINAL NEGATIVE REPORT Performed at [facility]    Report Status PENDING  Incomplete  Culture, blood (routine x 2)     Status: None (Preliminary result)   Collection Time: 06/15/14  2:44 PM  Result Value Ref Range Status   Specimen Description BLOOD LEFT FOREARM  Final   Special Requests BOTTLES DRAWN AEROBIC AND ANAEROBIC  Final   Culture   Final           BLOOD CULTURE RECEIVED NO GROWTH TO DATE CULTURE WILL BE HELD FOR 5 DAYS BEFORE ISSUING A FINAL NEGATIVE REPORT Performed at [facility]    Report Status PENDING  Incomplete  Urine culture     Status: None   Collection Time:  06/15/14  3:15 PM  Result Value Ref Range Status   Specimen Description URINE, CLEAN CATCH  Final   Special Requests NONE  Final   Colony Count NO GROWTH Performed at [facility]   Final   Culture NO GROWTH Performed at [facility]   Final   Report Status 06/16/2014 FINAL  Final  Wound culture     Status: None (Preliminary result)   Collection Time: 06/15/14  6:10 PM  Result Value Ref Range Status   Specimen Description WOUND ABDOMEN  Final   Special Requests Normal  Final   Gram Stain   Final    FEW WBC PRESENT,BOTH PMN AND MONONUCLEAR NO SQUAMOUS EPITHELIAL CELLS SEEN RARE GRAM POSITIVE COCCI IN PAIRS IN CLUSTERS Performed at [facility]    Culture   Final    ABUNDANT STAPHYLOCOCCUS AUREUS Note: RIFAMPIN AND GENTAMICIN SHOULD NOT BE USED AS SINGLE DRUGS FOR TREATMENT OF STAPH INFECTIONS. Performed at [facility]    Report Status PENDING  Incomplete  MRSA PCR Screening     Status: None   Collection Time: 06/15/14  8:47 PM  Result Value Ref Range Status   MRSA by PCR NEGATIVE NEGATIVE Final    Comment:        The GeneXpert MRSA Assay (FDA approved for NASAL specimens only), is one component of a comprehensive MRSA colonization surveillance program. It is not intended to diagnose MRSA infection nor to guide or monitor treatment for MRSA infections.      Studies: Dg Chest 2 View  06/15/2014   CLINICAL DATA:  Fever of 103 for 3 days, headache, LEFT ear pain, hypertension  EXAM: CHEST  2 VIEW  COMPARISON:  None  FINDINGS: Enlargement of cardiac silhouette.  Mediastinal contours and pulmonary vascularity normal.  Suspicion of LEFT perihilar infiltrate.  Remaining lungs clear.  No pleural effusion or pneumothorax.  IMPRESSION: Enlargement of cardiac silhouette.  Suspect subtle LEFT perihilar infiltrate.   Electronically Signed   By: Ulyses Southward M.D.   On: 06/15/2014 16:07   US Soft Tissue Head/neck  06/16/2014   CLINICAL DATA:  Neck  swelling.  EXAM: THYROID ULTRASOUND  TECHNIQUE: Ultrasound examination of the thyroid gland and adjacent soft tissues was performed.  COMPARISON:  None.  FINDINGS: Right thyroid lobe  Measurements: 5.2 x 2.3 x 2.0 cm. The right lobe is mildly enlarged relative to the left. There is no evidence of solid nodule. No increased vascularity or abnormal calcifications identified.  Left thyroid lobe  Measurements: 4.2 x 1.4 x 1.3 cm. Normal echotexture without evidence of nodule, abnormal vascularity or calcifications.  Isthmus  Thickness: 0.6 cm.  Small, subtle area of nodularity in the right isthmus may not be a truly distinct nodule may rather be an area of heterogeneous tissue. This measures approximately 0.6 cm.  Lymphadenopathy  None visualized.  IMPRESSION: Mild enlargement of the right lobe of the thyroid compared to the left lobe. Otherwise unremarkable thyroid ultrasound. A small area of nodular heterogeneity in the right isthmus is identified which may be a pseudo nodule.  Findings do not meet current SRU consensus criteria for biopsy. Follow-up by clinical exam is recommended. If patient has known risk factors for thyroid carcinoma, consider follow-up ultrasound in 12 months. If patient is clinically hyperthyroid, consider nuclear medicine thyroid uptake and scan.Reference: Management of Thyroid Nodules Detected at [place]: Society of Radiologists in Ultrasound Consensus Conference Statement. Radiology 2005; [id number].   Electronically Signed   By: Irish Lack M.D.   On: 06/16/2014 15:04    Scheduled Meds: . ampicillin-sulbactam (UNASYN) IV  3 g Intravenous Q6H  . heparin  5,000 Units Subcutaneous 3 times per day  . sodium chloride  3 mL Intravenous Q12H   Continuous Infusions: . sodium chloride 100 mL/hr at 06/16/14 2205    Principal Problem:   Sepsis Active Problems:   Abscess of abdominal wall    Time spent: 35 minutes.     Hartley Barefoot A  [facility] Pager [phone]. If  7PM-7AM, please contact night-coverage at [email or web address], password [facility] 06/17/2014, 3:47 PM  LOS: 2 days

## 2014-06-18 ENCOUNTER — Encounter (HOSPITAL_COMMUNITY): Payer: Self-pay

## 2014-06-18 DIAGNOSIS — A4102 Sepsis due to Methicillin resistant Staphylococcus aureus: Secondary | ICD-10-CM

## 2014-06-18 LAB — WOUND CULTURE: SPECIAL REQUESTS: NORMAL

## 2014-06-18 LAB — BASIC METABOLIC PANEL
Anion gap: 8 (ref 5–15)
CO2: 28 mmol/L (ref 22–32)
Calcium: 8.4 mg/dL — ABNORMAL LOW (ref 8.9–10.3)
Chloride: 104 mmol/L (ref 101–111)
Creatinine, Ser: 0.7 mg/dL (ref 0.44–1.00)
GLUCOSE: 83 mg/dL (ref 65–99)
Potassium: 3.8 mmol/L (ref 3.5–5.1)
SODIUM: 140 mmol/L (ref 135–145)

## 2014-06-18 LAB — CBC
HEMATOCRIT: 36 % (ref 36.0–46.0)
HEMOGLOBIN: 11.8 g/dL — AB (ref 12.0–15.0)
MCH: 26.7 pg (ref 26.0–34.0)
MCHC: 32.8 g/dL (ref 30.0–36.0)
MCV: 81.4 fL (ref 78.0–100.0)
PLATELETS: 213 10*3/uL (ref 150–400)
RBC: 4.42 MIL/uL (ref 3.87–5.11)
RDW: 14.1 % (ref 11.5–15.5)
WBC: 6.2 10*3/uL (ref 4.0–10.5)

## 2014-06-18 MED ORDER — SULFAMETHOXAZOLE-TRIMETHOPRIM 400-80 MG PO TABS: 1.0000 | ORAL_TABLET | Freq: Two times a day (BID) | ORAL | Status: DC

## 2014-06-18 MED ORDER — FLUCONAZOLE 100 MG PO TABS: 100.0000 mg | ORAL_TABLET | Freq: Every day | ORAL | Status: DC

## 2014-06-18 MED ORDER — MUPIROCIN 2 % EX OINT
1.0000 "application " | TOPICAL_OINTMENT | Freq: Two times a day (BID) | CUTANEOUS | Status: DC
  Administered 2014-06-18: 1 via NASAL
  Filled 2014-06-18: qty 22

## 2014-06-18 MED ORDER — SULFAMETHOXAZOLE-TRIMETHOPRIM 400-80 MG PO TABS
1.0000 | ORAL_TABLET | Freq: Two times a day (BID) | ORAL | Status: DC
  Administered 2014-06-18: 1 via ORAL
  Filled 2014-06-18 (×2): qty 1

## 2014-06-18 MED ORDER — CLINDAMYCIN HCL 300 MG PO CAPS: 600.0000 mg | ORAL_CAPSULE | Freq: Three times a day (TID) | ORAL | Status: DC

## 2014-06-18 MED ORDER — FLUCONAZOLE 100 MG PO TABS
100.0000 mg | ORAL_TABLET | Freq: Every day | ORAL | Status: DC
Start: 2014-06-18 — End: 2014-06-18
  Administered 2014-06-18: 100 mg via ORAL
  Filled 2014-06-18: qty 1

## 2014-06-18 MED ORDER — CHLORHEXIDINE GLUCONATE CLOTH 2 % EX PADS
6.0000 | MEDICATED_PAD | Freq: Every day | CUTANEOUS | Status: DC
  Administered 2014-06-18: 6 via TOPICAL

## 2014-06-18 MED ORDER — ONDANSETRON HCL 4 MG PO TABS: 4.0000 mg | ORAL_TABLET | Freq: Four times a day (QID) | ORAL | Status: DC | PRN

## 2014-06-18 MED ORDER — DIPHENHYDRAMINE HCL 25 MG PO CAPS
25.0000 mg | ORAL_CAPSULE | Freq: Once | ORAL | Status: AC
  Administered 2014-06-18: 25 mg via ORAL
  Filled 2014-06-18: qty 1

## 2014-06-18 NOTE — Discharge Summary (Signed)
Physician Discharge Summary  Alexis Montgomery WJX:[id number] DOB: 28-Dec-1990 DOA: 06/15/2014  PCP: No PCP Per Patient  Admit date: 06/15/2014 Discharge date: 06/18/2014  Time spent: 35 minutes  Recommendations for Outpatient Follow-up:  Please follow HbA1c results. Pending at discharge/  Follow resolution of abdominal wall abscess.  Evaluation for HTN.   Discharge Diagnoses:    Sepsis   Abscess of abdominal wall   Enlargement right thyroid lobe  Discharge Condition: stable.   Diet recommendation: regular.   Filed Weights   06/15/14 1405 06/15/14 1428  Weight: 91.173 kg (201 lb) 93.441 kg (206 lb)    History of present illness:  Alexis Montgomery is a 24 y.o. female medical history of recurrent boils, preeclampsia given to the hospital complaining about fever and chills. Patient reported that she had a boil in her abdomen started to cause pain, redness and swelling since Monday. Patient said he started to have some fever with it as well. She was seen in the emergency department on the 7th and was given one shot of Rocephin and Bactrim and sent home. Patient continued to have fever and chills so she came into the ED today for further evaluation. In the ED temperature of 103.9, pulse is 124, RR of 35 but stable blood pressure. Blood cultures done, urinalysis appears to be negative. Patient has a boil in her left side of upper abdomen which been I&Ded in the ED.  Hospital Course:  1-Sepsis: from abdominal wall infection Lactic acid 1.5.  Continue with IV fluids.  Follow culture. Blood culture no growth to date.  Vital stable.   2-Abdominal Wall Abscess: HIV negative, Hb-A1 c pending.  Redness decreased, no significant drainage.  Continue with IV antibiotics.  Wound growing MRSA.  She vomited with doxy, develops itching with bactrim. She will be discharge on clindamycin  3-HTN;was not on medications. SBP normal during admission.   4-Fever: influenza negative. Secondary to #  2  5-Enlargement right thyroid lobe; [place] confirm finding.  TSH normal.  Advised patient to follow up with endocrinologist.   6-Headaches: suspect related to acute illness. No neck stiffness. Monitor. Neuro exam non focal. Improved.   Procedures:  none  Consultations:  none  Discharge Exam: Filed Vitals:   06/18/14 0740  BP: 122/80  Pulse: 73  Temp: 98.3 F (36.8 C)  Resp: 16    General: NAD Cardiovascular: S 1, S 2 RRR Respiratory: CTA Abdomen; soft, nt. Small open wound, no drainage, mild redness around.   Discharge Instructions   Discharge Instructions    Diet - low sodium heart healthy    Complete by:  As directed      Increase activity slowly    Complete by:  As directed           Current Discharge Medication List    START taking these medications   Details  ondansetron (ZOFRAN) 4 MG tablet Take 1 tablet (4 mg total) by mouth every 6 (six) hours as needed for nausea. Qty: 20 tablet, Refills: 0    sulfamethoxazole-trimethoprim (BACTRIM,SEPTRA) 400-80 MG per tablet Take 1 tablet by mouth every 12 (twelve) hours. Qty: 14 tablet, Refills: 0      CONTINUE these medications which have NOT CHANGED   Details  acetaminophen (TYLENOL) 325 MG tablet Take 650 mg by mouth every 6 (six) hours as needed for moderate pain.    etonogestrel (NEXPLANON) 68 MG IMPL implant Inject 1 each into the skin once.    ibuprofen (ADVIL,MOTRIN) 600 MG tablet  Take 1 tablet (600 mg total) by mouth every 6 (six) hours as needed. Qty: 30 tablet, Refills: 0      STOP taking these medications     acetaminophen-codeine (TYLENOL #3) 300-30 MG per tablet      diclofenac (VOLTAREN) 50 MG EC tablet        No Known Allergies Follow-up Information    Please follow up.   Why:  please follow with PCP in 1 week       The results of significant diagnostics from this hospitalization (including imaging, microbiology, ancillary and laboratory) are listed below for reference.     Significant Diagnostic Studies: Dg Chest 2 View  06/15/2014   CLINICAL DATA:  Fever of 103 for 3 days, headache, LEFT ear pain, hypertension  EXAM: CHEST  2 VIEW  COMPARISON:  None  FINDINGS: Enlargement of cardiac silhouette.  Mediastinal contours and pulmonary vascularity normal.  Suspicion of LEFT perihilar infiltrate.  Remaining lungs clear.  No pleural effusion or pneumothorax.  IMPRESSION: Enlargement of cardiac silhouette.  Suspect subtle LEFT perihilar infiltrate.   Electronically Signed   By: Ulyses Southward M.D.   On: 06/15/2014 16:07   US Soft Tissue Head/neck  06/16/2014   CLINICAL DATA:  Neck swelling.  EXAM: THYROID ULTRASOUND  TECHNIQUE: Ultrasound examination of the thyroid gland and adjacent soft tissues was performed.  COMPARISON:  None.  FINDINGS: Right thyroid lobe  Measurements: 5.2 x 2.3 x 2.0 cm. The right lobe is mildly enlarged relative to the left. There is no evidence of solid nodule. No increased vascularity or abnormal calcifications identified.  Left thyroid lobe  Measurements: 4.2 x 1.4 x 1.3 cm. Normal echotexture without evidence of nodule, abnormal vascularity or calcifications.  Isthmus  Thickness: 0.6 cm. Small, subtle area of nodularity in the right isthmus may not be a truly distinct nodule may rather be an area of heterogeneous tissue. This measures approximately 0.6 cm.  Lymphadenopathy  None visualized.  IMPRESSION: Mild enlargement of the right lobe of the thyroid compared to the left lobe. Otherwise unremarkable thyroid ultrasound. A small area of nodular heterogeneity in the right isthmus is identified which may be a pseudo nodule.  Findings do not meet current SRU consensus criteria for biopsy. Follow-up by clinical exam is recommended. If patient has known risk factors for thyroid carcinoma, consider follow-up ultrasound in 12 months. If patient is clinically hyperthyroid, consider nuclear medicine thyroid uptake and scan.Reference: Management of Thyroid Nodules  Detected at [place]: Society of Radiologists in Ultrasound Consensus Conference Statement. Radiology 2005; [id number].   Electronically Signed   By: Irish Lack M.D.   On: 06/16/2014 15:04    Microbiology: Recent Results (from the past 240 hour(s))  Culture, blood (routine x 2)     Status: None (Preliminary result)   Collection Time: 06/15/14  2:30 PM  Result Value Ref Range Status   Specimen Description BLOOD RIGHT ARM  Final   Special Requests BOTTLES DRAWN AEROBIC AND ANAEROBIC  Final   Culture   Final           BLOOD CULTURE RECEIVED NO GROWTH TO DATE CULTURE WILL BE HELD FOR 5 DAYS BEFORE ISSUING A FINAL NEGATIVE REPORT Performed at [facility]    Report Status PENDING  Incomplete  Culture, blood (routine x 2)     Status: None (Preliminary result)   Collection Time: 06/15/14  2:44 PM  Result Value Ref Range Status   Specimen Description BLOOD LEFT FOREARM  Final  Special Requests BOTTLES DRAWN AEROBIC AND ANAEROBIC  Final   Culture   Final           BLOOD CULTURE RECEIVED NO GROWTH TO DATE CULTURE WILL BE HELD FOR 5 DAYS BEFORE ISSUING A FINAL NEGATIVE REPORT Performed at [facility]    Report Status PENDING  Incomplete  Urine culture     Status: None   Collection Time: 06/15/14  3:15 PM  Result Value Ref Range Status   Specimen Description URINE, CLEAN CATCH  Final   Special Requests NONE  Final   Colony Count NO GROWTH Performed at [facility]   Final   Culture NO GROWTH Performed at [facility]   Final   Report Status 06/16/2014 FINAL  Final  Wound culture     Status: None   Collection Time: 06/15/14  6:10 PM  Result Value Ref Range Status   Specimen Description WOUND ABDOMEN  Final   Special Requests Normal  Final   Gram Stain   Final    FEW WBC PRESENT,BOTH PMN AND MONONUCLEAR NO SQUAMOUS EPITHELIAL CELLS SEEN RARE GRAM POSITIVE COCCI IN PAIRS IN CLUSTERS Performed at [facility]    Culture   Final     ABUNDANT METHICILLIN RESISTANT STAPHYLOCOCCUS AUREUS Note: RIFAMPIN AND GENTAMICIN SHOULD NOT BE USED AS SINGLE DRUGS FOR TREATMENT OF STAPH INFECTIONS. This organism DOES NOT demonstrate inducible Clindamycin resistance in vitro. CRITICAL RESULT CALLED TO, READ BACK BY AND VERIFIED WITH: CHRISTY @ 0950 ON  06/18/14 BY KENDR Performed at [facility]    Report Status 06/18/2014 FINAL  Final   Organism ID, Bacteria METHICILLIN RESISTANT STAPHYLOCOCCUS AUREUS  Final      Susceptibility   Methicillin resistant staphylococcus aureus - MIC*    CLINDAMYCIN <=0.25 SENSITIVE Sensitive     ERYTHROMYCIN >=8 RESISTANT Resistant     GENTAMICIN <=0.5 SENSITIVE Sensitive     LEVOFLOXACIN 4 INTERMEDIATE Intermediate     OXACILLIN >=4 RESISTANT Resistant     PENICILLIN >=0.5 RESISTANT Resistant     RIFAMPIN <=0.5 SENSITIVE Sensitive     TRIMETH/SULFA <=10 SENSITIVE Sensitive     VANCOMYCIN 1 SENSITIVE Sensitive     TETRACYCLINE <=1 SENSITIVE Sensitive     * ABUNDANT METHICILLIN RESISTANT STAPHYLOCOCCUS AUREUS  MRSA PCR Screening     Status: None   Collection Time: 06/15/14  8:47 PM  Result Value Ref Range Status   MRSA by PCR NEGATIVE NEGATIVE Final    Comment:        The GeneXpert MRSA Assay (FDA approved for NASAL specimens only), is one component of a comprehensive MRSA colonization surveillance program. It is not intended to diagnose MRSA infection nor to guide or monitor treatment for MRSA infections.      Labs: Basic Metabolic Panel:  Recent Labs Lab 06/15/14 1430 06/16/14 0351 06/17/14 0339 06/18/14 0354  NA 130* 135 140 140  K 4.0 3.7 3.7 3.8  CL 103 109 108 104  CO2 18* 18* 26 28  GLUCOSE 101* 87 106* 83  BUN <5*  CREATININE 0.95 0.76 0.68 0.70  CALCIUM 8.5* 7.8* 8.3* 8.4*   Liver Function Tests:  Recent Labs Lab 06/15/14 1430  AST 25  ALT 22  ALKPHOS 61  BILITOT 0.3  PROT 6.6  ALBUMIN 3.5   No results for input(s): LIPASE, AMYLASE in the last  168 hours. No results for input(s): AMMONIA in the last 168 hours. CBC:  Recent Labs Lab  06/15/14 1430 06/16/14 0351 06/17/14 0339 06/18/14 0354  WBC 3.4* 3.7* 5.2 6.2  NEUTROABS 1.9  --   --   --   HGB 13.5 12.7 12.0 11.8*  HCT 40.0 38.6 36.3 36.0  MCV 80.2 81.4 81.2 81.4  PLT 222 198 201 213   Cardiac Enzymes: No results for input(s): CKTOTAL, CKMB, CKMBINDEX, TROPONINI in the last 168 hours. BNP: BNP (last 3 results) No results for input(s): BNP in the last 8760 hours.  ProBNP (last 3 results) No results for input(s): PROBNP in the last 8760 hours.  CBG: No results for input(s): GLUCAP in the last 168 hours.     Signed:  Hartley Barefoot A  [facility] 06/18/2014, 10:21 AM

## 2014-06-18 NOTE — Progress Notes (Signed)
Lab called to notify RN patient wound positive for MRSA. Placed on contact precautions. MD notified.

## 2014-06-18 NOTE — Progress Notes (Signed)
Patient complained of itching all over after Bactrim given. Dr. Sunnie Nielsen notified. Will monitor patient for worsening symptoms.

## 2014-06-18 NOTE — Progress Notes (Signed)
Alexis Montgomery to be D/C'd Home per MD order.  Discussed with the patient and all questions fully answered.  VSS, Skin clean, dry and intact without evidence of skin break down, no evidence of skin tears noted. IV catheter discontinued intact. Site without signs and symptoms of complications. Dressing and pressure applied.  An After Visit Summary was printed and given to the patient. Patient received prescription.  D/c education completed with patient/family including follow up instructions, medication list, d/c activities limitations if indicated, with other d/c instructions as indicated by MD - patient able to verbalize understanding, all questions fully answered.   Patient instructed to return to ED, call 911, or call MD for any changes in condition.   Patient escorted via WC, and D/C home via private auto.  Alexis Montgomery 06/18/2014 1:27 PM

## 2014-06-19 LAB — HEMOGLOBIN A1C
Hgb A1c MFr Bld: 5.6 % (ref 4.8–5.6)
Mean Plasma Glucose: 114 mg/dL

## 2014-06-21 LAB — CULTURE, BLOOD (ROUTINE X 2)
CULTURE: NO GROWTH
Culture: NO GROWTH

## 2014-08-28 ENCOUNTER — Emergency Department (HOSPITAL_COMMUNITY)
Admission: EM | Admit: 2014-08-28 | Discharge: 2014-08-28 | Disposition: A | Discharge status: Home / Self Care | Payer: Medicaid Other | Attending: Physician Assistant | Admitting: Physician Assistant

## 2014-08-28 ENCOUNTER — Encounter (HOSPITAL_COMMUNITY): Payer: Self-pay

## 2014-08-28 DIAGNOSIS — L0291 Cutaneous abscess, unspecified: Secondary | ICD-10-CM

## 2014-08-28 DIAGNOSIS — Z79899 Other long term (current) drug therapy: Secondary | ICD-10-CM | POA: Insufficient documentation

## 2014-08-28 DIAGNOSIS — Z8614 Personal history of Methicillin resistant Staphylococcus aureus infection: Secondary | ICD-10-CM | POA: Diagnosis not present

## 2014-08-28 DIAGNOSIS — L02211 Cutaneous abscess of abdominal wall: Secondary | ICD-10-CM | POA: Diagnosis not present

## 2014-08-28 DIAGNOSIS — L0292 Furuncle, unspecified: Secondary | ICD-10-CM | POA: Diagnosis present

## 2014-08-28 DIAGNOSIS — L02413 Cutaneous abscess of right upper limb: Secondary | ICD-10-CM | POA: Diagnosis not present

## 2014-08-28 MED ORDER — TRAMADOL HCL 50 MG PO TABS: 50.0000 mg | ORAL_TABLET | Freq: Four times a day (QID) | ORAL | Status: DC | PRN

## 2014-08-28 MED ORDER — CLINDAMYCIN HCL 150 MG PO CAPS: 300.0000 mg | ORAL_CAPSULE | Freq: Three times a day (TID) | ORAL | Status: DC

## 2014-08-28 NOTE — Discharge Instructions (Signed)
Take the prescribed medication as directed. Follow-up with your primary care physician on Friday as scheduled. Return to the ED for new or worsening symptoms.

## 2014-08-28 NOTE — ED Provider Notes (Signed)
CSN: [id number]     Arrival date & time 08/28/14  1435 History  This chart was scribed for non-physician practitioner Sharilyn Sites, PA-C working with Abelino Derrick, MD by Littie Deeds, ED Scribe. This patient was seen in room WTR7/WTR7 and the patient's care was started at 3:37 PM.      Chief Complaint  Patient presents with  . Boils    The history is provided by the patient. No language interpreter was used.   HPI Comments: Alexis Montgomery is a 24 y.o. female with a history of recurrent boils who presents to the Emergency Department complaining of multiple skin boils throughout her abdomen, right arm and left axilla that worsened 2 weeks ago. Patient states she has 13 skin boils and notes that 2 of them are painful (one on her abdomen and one on her right forearm. The pain is worse at night. She denies fever. She was admitted to the hospital 3 months ago for multiple abscesses and notes that she did have MRSA which was localized to the wound. She was discharged with antibiotics, but her mother notes that she did not complete the course of antibiotics.  She denies any fever or chills.No history of IV drug use.   Past Medical History  Diagnosis Date  . Recurrent boils   . Pregnancy induced hypertension   . Contraceptive management 06/10/2012    nexplanon inserted left arm 06/10/12   Past Surgical History  Procedure Laterality Date  . Cesarean section N/A 04/23/2012    Procedure: CESAREAN SECTION;  Surgeon: Tereso Newcomer, MD;  Location: [facility];  Service: Obstetrics;  Laterality: N/A;   Family History  Problem Relation Age of Onset  . HIV Mother   . Cancer Mother   . Diabetes Paternal Uncle   . Heart Problems Paternal Grandfather    Social History  Substance Use Topics  . Smoking status: Never Smoker   . Smokeless tobacco: Never Used  . Alcohol Use: No   OB History    Gravida Para Term Preterm AB TAB SAB Ectopic Multiple Living   1 1 0 1 0 0 0 0 0 1      Review of Systems   Constitutional: Negative for fever.  Skin:       Boils   All other systems reviewed and are negative.     Allergies  Review of patient's allergies indicates no known allergies.  Home Medications   Prior to Admission medications   Medication Sig Start Date End Date Taking? Authorizing Provider  acetaminophen (TYLENOL) 325 MG tablet Take 650 mg by mouth every 6 (six) hours as needed for moderate pain.    Historical Provider, MD  clindamycin (CLEOCIN) 300 MG capsule Take 2 capsules (600 mg total) by mouth every 8 (eight) hours. 06/19/14   Belkys A Regalado, MD  etonogestrel (NEXPLANON) 68 MG IMPL implant Inject 1 each into the skin once.    Historical Provider, MD  fluconazole (DIFLUCAN) 100 MG tablet Take 1 tablet (100 mg total) by mouth daily. 06/18/14   Belkys A Regalado, MD  ibuprofen (ADVIL,MOTRIN) 600 MG tablet Take 1 tablet (600 mg total) by mouth every 6 (six) hours as needed. 06/13/14   Ivery Quale, PA-C  ondansetron (ZOFRAN) 4 MG tablet Take 1 tablet (4 mg total) by mouth every 6 (six) hours as needed for nausea. 06/18/14   Belkys A Regalado, MD   BP 129/87 mmHg  Pulse 90  Temp(Src) 98.3 F (36.8 C) (Oral)  Resp 18  SpO2 98%  LMP 07/28/2014 (Approximate)   Physical Exam  Constitutional: She is oriented to person, place, and time. She appears well-developed and well-nourished.  HENT:  Head: Normocephalic and atraumatic.  Mouth/Throat: Oropharynx is clear and moist.  Eyes: Conjunctivae and EOM are normal. Pupils are equal, round, and reactive to light.  Neck: Normal range of motion.  Cardiovascular: Normal rate, regular rhythm and normal heart sounds.   Pulmonary/Chest: Effort normal and breath sounds normal.  Abdominal: Soft. Bowel sounds are normal.  Musculoskeletal: Normal range of motion.  Neurological: She is alert and oriented to person, place, and time.  Skin: Skin is warm and dry.  multiple scars scattered across abdomen in various stages of healing from prior  boils Acute abscesses to left mid abdomen with purulent drainage noted as well as smaller boil to right dorsal forearm without drainage; both areas with amount of surrounding erythema without definitive cellulitis  Psychiatric: She has a normal mood and affect.  Nursing note and vitals reviewed.   ED Course  Procedures  DIAGNOSTIC STUDIES: Oxygen Saturation is 98% on room air, normal by my interpretation.    COORDINATION OF CARE: 3:43 PM-Discussed treatment plan which includes medications with patient/guardian at bedside and patient/guardian agreed to plan.    Labs Review Labs Reviewed - No data to display  Imaging Review No results found.    EKG Interpretation None      MDM   Final diagnoses:  Abscess   24 year old female here with multiple skin abscesses. Long-standing history of the same. Does have history of MRSA localized to wound, no systemic infection.  Patient is afebrile, nontoxic. She has multiple scars noted to her abdomen in various stages of healing from prior abscesses. She does have acute abscesses of abdomen and right forearm as noted above.  No definitive cellulitis at this time.  Given patient hx, will start on course of clindmycin.  Patient was strongly encouraged to complete entire course of abx.  She has scheduled FU with PCP in 4 days.  Discussed plan with patient, he/she acknowledged understanding and agreed with plan of care.  Return precautions given for new or worsening symptoms.  I personally performed the services described in this documentation, which was scribed in my presence. The recorded information has been reviewed and is accurate.   Garlon Hatchet, PA-C 08/28/14 1705  Courteney Randall An, MD 08/28/14 [phone]

## 2014-08-28 NOTE — ED Notes (Signed)
Pt states she gets skin boils all the time. Always maintains at least one.  Usually given antibiotics and sent home.  Has had one previous admission for same.  Started getting increased amount x 2 weeks ago.  Called MD but cannot get in to see him until Friday.  Noticeable lesions to abdomen and rt arm.

## 2014-10-09 ENCOUNTER — Encounter: Payer: Self-pay | Admitting: Infectious Disease

## 2014-10-09 ENCOUNTER — Ambulatory Visit (INDEPENDENT_AMBULATORY_CARE_PROVIDER_SITE_OTHER): Payer: Medicaid Other | Admitting: Infectious Disease

## 2014-10-09 VITALS — BP 138/99 | HR 100 | Temp 98.6°F | Ht 62.0 in | Wt 210.0 lb

## 2014-10-09 DIAGNOSIS — Z8679 Personal history of other diseases of the circulatory system: Secondary | ICD-10-CM | POA: Insufficient documentation

## 2014-10-09 DIAGNOSIS — A4902 Methicillin resistant Staphylococcus aureus infection, unspecified site: Secondary | ICD-10-CM

## 2014-10-09 DIAGNOSIS — Z113 Encounter for screening for infections with a predominantly sexual mode of transmission: Secondary | ICD-10-CM | POA: Diagnosis not present

## 2014-10-09 DIAGNOSIS — I10 Essential (primary) hypertension: Secondary | ICD-10-CM

## 2014-10-09 DIAGNOSIS — Z23 Encounter for immunization: Secondary | ICD-10-CM

## 2014-10-09 DIAGNOSIS — L732 Hidradenitis suppurativa: Secondary | ICD-10-CM

## 2014-10-09 HISTORY — DX: Morbid (severe) obesity due to excess calories: E66.01

## 2014-10-09 HISTORY — DX: Hidradenitis suppurativa: L73.2

## 2014-10-09 HISTORY — DX: Methicillin resistant Staphylococcus aureus infection, unspecified site: A49.02

## 2014-10-09 HISTORY — DX: Essential (primary) hypertension: I10

## 2014-10-09 MED ORDER — SPIRONOLACTONE 50 MG PO TABS: 50.0000 mg | ORAL_TABLET | Freq: Every day | ORAL | Status: DC

## 2014-10-09 MED ORDER — CLINDAMYCIN PHOSPHATE 1 % EX SOLN: Freq: Two times a day (BID) | CUTANEOUS | Status: DC

## 2014-10-09 NOTE — Progress Notes (Signed)
Reason for Consult: workup for recurrent abscesses, MRSA  Requesting Physician: Otelia Limes, FNP    Subjective:    Patient ID: Alexis Montgomery, female    DOB: 1990-04-14, 24 y.o.   MRN: [id number]  HPI  24 year old with hx of recurrent boils, skin infections since she was a child. Her sister suffers from a similar condition and it appears that she has been diagnosed with Hidradenitis suppurativa and is currently being managed by aa Dermatologist with TNF-antagonist therapy.  Ms Kizziah states that  Lesions previously appeared on buttocks under each armpit, and in recent flares especially under both breasts She was most recently rx with clindamycin with resolution of these lesions with scarring. She typically goes between 2 weeks before having another flare of infection. MRSA was isolated this year after I and D of lesion.  She has not tried decolonization regimen for staphylococcus  Aureus. In the past the lesions have in fact responded to amoxicillin suggesting that some of these have more likely been strep infections.   Past Medical History  Diagnosis Date  . Recurrent boils   . Pregnancy induced hypertension   . Contraceptive management 06/10/2012    nexplanon inserted left arm 06/10/12  . Anemia     Past Surgical History  Procedure Laterality Date  . Cesarean section N/A 04/23/2012    Procedure: CESAREAN SECTION;  Surgeon: Tereso Newcomer, MD;  Location: [facility];  Service: Obstetrics;  Laterality: N/A;    Family History  Problem Relation Age of Onset  . HIV Mother   . Cancer Mother   . Diabetes Paternal Uncle   . Heart Problems Paternal Grandfather   . Other Sister     hidradenitis suppurativa      Social History   Social History  . Marital Status: Married    Spouse Name: N/A  . Number of Children: N/A  . Years of Education: N/A   Social History Main Topics  . Smoking status: Never Smoker   . Smokeless tobacco: Never Used  . Alcohol Use: No  . Drug Use: No  .  Sexual Activity: Yes    Birth Control/ Protection: Implant, Condom   Other Topics Concern  . None   Social History Narrative    No Known Allergies   Current outpatient prescriptions:  .  acetaminophen (TYLENOL) 325 MG tablet, Take 650 mg by mouth every 6 (six) hours as needed for moderate pain., Disp: , Rfl:  .  clindamycin (CLEOCIN) 150 MG capsule, Take 2 capsules (300 mg total) by mouth 3 (three) times daily. May dispense as  capsules, Disp: 60 capsule, Rfl: 0 .  etonogestrel (NEXPLANON) 68 MG IMPL implant, Inject 1 each into the skin once., Disp: , Rfl:  .  ibuprofen (ADVIL,MOTRIN) 600 MG tablet, Take 1 tablet (600 mg total) by mouth every 6 (six) hours as needed., Disp: 30 tablet, Rfl: 0 .  clindamycin (CLEOCIN-T) 1 % external solution, Apply topically 2 (two) times daily., Disp: 60 mL, Rfl: 1 .  spironolactone (ALDACTONE) 50 MG tablet, Take 1 tablet (50 mg total) by mouth daily., Disp: 30 tablet, Rfl: 11   Review of Systems  Constitutional: Negative for fever, chills, diaphoresis, activity change, appetite change, fatigue and unexpected weight change.  HENT: Negative for congestion, rhinorrhea, sinus pressure, sneezing, sore throat and trouble swallowing.   Eyes: Negative for photophobia and visual disturbance.  Respiratory: Negative for cough, chest tightness, shortness of breath, wheezing and stridor.   Cardiovascular: Negative for chest  pain, palpitations and leg swelling.  Gastrointestinal: Negative for nausea, vomiting, abdominal pain, diarrhea, constipation, blood in stool, abdominal distention and anal bleeding.  Genitourinary: Negative for dysuria, hematuria, flank pain and difficulty urinating.  Musculoskeletal: Negative for myalgias, back pain, joint swelling, arthralgias and gait problem.  Skin: Positive for rash and wound. Negative for color change and pallor.  Neurological: Negative for dizziness, tremors, weakness and light-headedness.  Hematological: Negative  for adenopathy. Does not bruise/bleed easily.  Psychiatric/Behavioral: Negative for behavioral problems, confusion, sleep disturbance, dysphoric mood, decreased concentration and agitation.       Objective:   Physical Exam  Constitutional: She is oriented to person, place, and time. She appears well-developed and well-nourished. No distress.  HENT:  Head: Normocephalic and atraumatic.  Mouth/Throat: No oropharyngeal exudate.  Eyes: Conjunctivae and EOM are normal. No scleral icterus.  Neck: Normal range of motion. Neck supple.  Cardiovascular: Normal rate and regular rhythm.   Pulmonary/Chest: Effort normal. No respiratory distress. She has no wheezes.    Abdominal: She exhibits no distension.  Musculoskeletal: She exhibits no edema or tenderness.  Neurological: She is alert and oriented to person, place, and time. She exhibits normal muscle tone. Coordination normal.  Skin: Skin is warm and dry. No rash noted. She is not diaphoretic. No erythema. No pallor.  Psychiatric: She has a normal mood and affect. Her behavior is normal. Judgment and thought content normal.          Assessment & Plan:   Likely Hidradenitis suppurativa: given recurrent infections under armpits, under left breast other moist areas of the body and chronicity of condition with lesions that responded to non Staph aureus drugs such as amoxicillin and sister with condition would push this as likely underlying condition causing her recurrent infections  --start aldactone in place of ACEI for anti-androgen effect --consideration of specific oral contraceptive --weight loss --anti perspirants --consider dairy free diet --clindamycin topical BID --if flares again in next 2 weeks try one month of doxycyline --agree with referral to same Dermatologist who  Is treating her sister  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

## 2014-12-07 ENCOUNTER — Ambulatory Visit: Payer: Medicaid Other | Admitting: Infectious Disease

## 2014-12-26 ENCOUNTER — Emergency Department (HOSPITAL_COMMUNITY)
Admission: EM | Admit: 2014-12-26 | Discharge: 2014-12-26 | Disposition: A | Discharge status: Home / Self Care | Payer: Medicaid Other | Attending: Emergency Medicine | Admitting: Emergency Medicine

## 2014-12-26 ENCOUNTER — Encounter (HOSPITAL_COMMUNITY): Payer: Self-pay

## 2014-12-26 DIAGNOSIS — Z8614 Personal history of Methicillin resistant Staphylococcus aureus infection: Secondary | ICD-10-CM | POA: Diagnosis not present

## 2014-12-26 DIAGNOSIS — I10 Essential (primary) hypertension: Secondary | ICD-10-CM | POA: Insufficient documentation

## 2014-12-26 DIAGNOSIS — Z862 Personal history of diseases of the blood and blood-forming organs and certain disorders involving the immune mechanism: Secondary | ICD-10-CM | POA: Insufficient documentation

## 2014-12-26 DIAGNOSIS — R000 Tachycardia, unspecified: Secondary | ICD-10-CM | POA: Diagnosis not present

## 2014-12-26 DIAGNOSIS — Z3202 Encounter for pregnancy test, result negative: Secondary | ICD-10-CM | POA: Insufficient documentation

## 2014-12-26 DIAGNOSIS — L0291 Cutaneous abscess, unspecified: Secondary | ICD-10-CM

## 2014-12-26 DIAGNOSIS — L02211 Cutaneous abscess of abdominal wall: Secondary | ICD-10-CM | POA: Insufficient documentation

## 2014-12-26 DIAGNOSIS — Z79899 Other long term (current) drug therapy: Secondary | ICD-10-CM | POA: Insufficient documentation

## 2014-12-26 DIAGNOSIS — Z9889 Other specified postprocedural states: Secondary | ICD-10-CM | POA: Insufficient documentation

## 2014-12-26 LAB — CBC WITH DIFFERENTIAL/PLATELET
Basophils Absolute: 0 10*3/uL (ref 0.0–0.1)
Basophils Relative: 0 %
Eosinophils Absolute: 0.9 10*3/uL — ABNORMAL HIGH (ref 0.0–0.7)
Eosinophils Relative: 8 %
HCT: 41.7 % (ref 36.0–46.0)
Hemoglobin: 14 g/dL (ref 12.0–15.0)
LYMPHS ABS: 1.4 10*3/uL (ref 0.7–4.0)
Lymphocytes Relative: 13 %
MCH: 27.6 pg (ref 26.0–34.0)
MCHC: 33.6 g/dL (ref 30.0–36.0)
MCV: 82.2 fL (ref 78.0–100.0)
MONO ABS: 0.4 10*3/uL (ref 0.1–1.0)
MONOS PCT: 4 %
Neutro Abs: 8.4 10*3/uL — ABNORMAL HIGH (ref 1.7–7.7)
Neutrophils Relative %: 75 %
Platelets: 318 10*3/uL (ref 150–400)
RBC: 5.07 MIL/uL (ref 3.87–5.11)
RDW: 13.8 % (ref 11.5–15.5)
WBC: 11.1 10*3/uL — ABNORMAL HIGH (ref 4.0–10.5)

## 2014-12-26 LAB — COMPREHENSIVE METABOLIC PANEL
ALK PHOS: 56 U/L (ref 38–126)
ALT: 19 U/L (ref 14–54)
AST: 16 U/L (ref 15–41)
Albumin: 3.6 g/dL (ref 3.5–5.0)
Anion gap: 10 (ref 5–15)
BUN: 9 mg/dL (ref 6–20)
CALCIUM: 8.7 mg/dL — AB (ref 8.9–10.3)
CHLORIDE: 105 mmol/L (ref 101–111)
CO2: 22 mmol/L (ref 22–32)
Creatinine, Ser: 0.74 mg/dL (ref 0.44–1.00)
GFR calc Af Amer: >60 mL/min (ref >60)
GFR calc non Af Amer: >60 mL/min (ref >60)
GLUCOSE: 107 mg/dL — AB (ref 65–99)
POTASSIUM: 3.6 mmol/L (ref 3.5–5.1)
Sodium: 137 mmol/L (ref 135–145)
Total Bilirubin: 0.8 mg/dL (ref 0.3–1.2)
Total Protein: 6.7 g/dL (ref 6.5–8.1)

## 2014-12-26 LAB — I-STAT BETA HCG BLOOD, ED (MC, WL, AP ONLY): I-stat hCG, quantitative: <5 m[IU]/mL (ref <5)

## 2014-12-26 LAB — I-STAT CG4 LACTIC ACID, ED: Lactic Acid, Venous: 1.33 mmol/L (ref 0.5–2.0)

## 2014-12-26 MED ORDER — FENTANYL CITRATE (PF) 100 MCG/2ML IJ SOLN
100.0000 ug | Freq: Once | INTRAMUSCULAR | Status: AC
  Administered 2014-12-26: 100 ug via INTRAVENOUS
  Filled 2014-12-26: qty 2

## 2014-12-26 MED ORDER — CLINDAMYCIN HCL 150 MG PO CAPS: 300.0000 mg | ORAL_CAPSULE | Freq: Three times a day (TID) | ORAL | Status: DC

## 2014-12-26 MED ORDER — ONDANSETRON HCL 4 MG/2ML IJ SOLN
4.0000 mg | Freq: Once | INTRAMUSCULAR | Status: AC
  Administered 2014-12-26: 4 mg via INTRAVENOUS
  Filled 2014-12-26: qty 2

## 2014-12-26 MED ORDER — ACETAMINOPHEN 325 MG PO TABS
ORAL_TABLET | ORAL | Status: AC
  Filled 2014-12-26: qty 2

## 2014-12-26 MED ORDER — CLINDAMYCIN PHOSPHATE 600 MG/50ML IV SOLN
600.0000 mg | Freq: Once | INTRAVENOUS | Status: AC
  Administered 2014-12-26: 600 mg via INTRAVENOUS
  Filled 2014-12-26: qty 50

## 2014-12-26 MED ORDER — ACETAMINOPHEN 325 MG PO TABS
650.0000 mg | ORAL_TABLET | Freq: Once | ORAL | Status: AC
  Administered 2014-12-26: 650 mg via ORAL

## 2014-12-26 MED ORDER — CLINDAMYCIN PHOSPHATE 1 % EX SOLN: Freq: Two times a day (BID) | CUTANEOUS | Status: DC

## 2014-12-26 MED ORDER — LIDOCAINE-EPINEPHRINE (PF) 2 %-1:200000 IJ SOLN
20.0000 mL | Freq: Once | INTRAMUSCULAR | Status: AC
  Administered 2014-12-26: 20 mL via INTRADERMAL
  Filled 2014-12-26: qty 20

## 2014-12-26 MED ORDER — SODIUM CHLORIDE 0.9 % IV BOLUS (SEPSIS)
1000.0000 mL | Freq: Once | INTRAVENOUS | Status: AC
  Administered 2014-12-26: 1000 mL via INTRAVENOUS

## 2014-12-26 NOTE — ED Notes (Signed)
I and D tray at bedside 

## 2014-12-26 NOTE — ED Notes (Signed)
Patient able to dress and ambulate independently 

## 2014-12-26 NOTE — ED Notes (Signed)
Patient here with abscess to right side of abdomen, seen by primary MD yesterday and started on bactrim. Today fever with same

## 2014-12-26 NOTE — ED Provider Notes (Signed)
CSN: [id number]     Arrival date & time 12/26/14  1321 History  By signing my name below, I, Ronney LionSuzanne Le, attest that this documentation has been prepared under the direction and in the presence of Danelle BerryLeisa Ameir Faria, PA-C. Electronically Signed: Ronney LionSuzanne Le, ED Scribe. 12/26/2014. 7:32 PM.    Chief Complaint  Patient presents with  . Abscess   The history is provided by the patient. No language interpreter was used.    HPI Comments: Alexis Montgomery is a 24 y.o. female with a history of hidradenitis suppurativa, recurrent boils, MRSA infection, and morbid obesity, who presents to the Emergency Department complaining of an abscess to her right-sided abdomen, with associated fever and nausea.  They abscesses on her right lower abdomen and she states she has been "mashing on it" but she has had no drainage.  She was seen by her PCP yesterday and started on Bactrim BID. She reports she has taken 2 doses of Bactrim so far. She states she's been having worsening abscesses then in recent years and earlier this year she was admitted for having an abscess which caused sepsis.  Patient has NKDA.  Past Medical History  Diagnosis Date  . Recurrent boils   . Pregnancy induced hypertension   . Contraceptive management 06/10/2012    nexplanon inserted left arm 06/10/12  . Anemia   . Hidradenitis suppurativa 10/09/2014  . MRSA infection 10/09/2014  . Benign essential HTN 10/09/2014  . Screen for STD (sexually transmitted disease) 10/09/2014  . Morbid obesity (HCC) 10/09/2014   Past Surgical History  Procedure Laterality Date  . Cesarean section N/A 04/23/2012    Procedure: CESAREAN SECTION;  Surgeon: Tereso NewcomerUgonna A Anyanwu, MD;  Location: [facility];  Service: Obstetrics;  Laterality: N/A;   Family History  Problem Relation Age of Onset  . HIV Mother   . Cancer Mother   . Diabetes Paternal Uncle   . Heart Problems Paternal Grandfather   . Other Sister     hidradenitis suppurativa   Social History  Substance Use Topics  .  Smoking status: Never Smoker   . Smokeless tobacco: Never Used  . Alcohol Use: No   OB History    Gravida Para Term Preterm AB TAB SAB Ectopic Multiple Living   1 1 0 1 0 0 0 0 0 1      Review of Systems  Constitutional: Positive for fever.  Gastrointestinal: Positive for nausea.  Skin: Positive for color change (localized area of pain, redness, and swelling to right side of abdomen).  All other systems reviewed and are negative.   Allergies  Review of patient's allergies indicates no known allergies.  Home Medications   Prior to Admission medications   Medication Sig Start Date End Date Taking? Authorizing Provider  acetaminophen (TYLENOL) 325 MG tablet Take 650 mg by mouth every 6 (six) hours as needed for moderate pain.    Historical Provider, MD  clindamycin (CLEOCIN) 150 MG capsule Take 2 capsules (300 mg total) by mouth 3 (three) times daily. May dispense as 150mg  capsules 12/26/14   Danelle BerryLeisa Denzil Bristol, PA-C  clindamycin (CLEOCIN-T) 1 % external solution Apply topically 2 (two) times daily. 12/26/14   Danelle BerryLeisa Trinidad Petron, PA-C  etonogestrel (NEXPLANON) 68 MG IMPL implant Inject 1 each into the skin once.    Historical Provider, MD  ibuprofen (ADVIL,MOTRIN) 600 MG tablet Take 1 tablet (600 mg total) by mouth every 6 (six) hours as needed. 06/13/14   Ivery QualeHobson Bryant, PA-C  spironolactone (ALDACTONE) 50 MG tablet  Take 1 tablet (50 mg total) by mouth daily. 10/09/14   Randall Hiss, MD   BP 126/71 mmHg  Pulse 75  Temp(Src) 98 F (36.7 C) (Oral)  Resp 16  SpO2 100% Physical Exam  Constitutional: She is oriented to person, place, and time. She appears well-developed and well-nourished. No distress.  HENT:  Head: Normocephalic and atraumatic.  Right Ear: External ear normal.  Left Ear: External ear normal.  Nose: Nose normal.  Mouth/Throat: Oropharynx is clear and moist. No oropharyngeal exudate.  Eyes: Conjunctivae and EOM are normal. Pupils are equal, round, and reactive to light. Right  eye exhibits no discharge. Left eye exhibits no discharge. No scleral icterus.  Neck: Normal range of motion. Neck supple. No JVD present. No tracheal deviation present.  Cardiovascular: Normal rate, regular rhythm, normal heart sounds and intact distal pulses.  Exam reveals no gallop and no friction rub.   No murmur heard. Pulmonary/Chest: Effort normal and breath sounds normal. No stridor. No respiratory distress.  Abdominal: Soft. Bowel sounds are normal. She exhibits no distension. There is no rebound and no guarding.  ttp over erythematous area of right lower abdomen Approximately 4x6 cm area of erythema with ttp, smaller central area of induration 3x2.  No drainage.    Musculoskeletal: Normal range of motion. She exhibits no edema.  Lymphadenopathy:    She has no cervical adenopathy.  Neurological: She is alert and oriented to person, place, and time. She exhibits normal muscle tone. Coordination normal.  Skin: Skin is warm and dry. No rash noted. She is not diaphoretic. No erythema. No pallor.  Psychiatric: She has a normal mood and affect. Her behavior is normal. Judgment and thought content normal.  Nursing note and vitals reviewed.   ED Course  Procedures (including critical care time)  DIAGNOSTIC STUDIES: Oxygen Saturation is 99% on RA, normal by my interpretation.    COORDINATION OF CARE: 7:32 PM - Discussed treatment plan with pt at bedside which includes IV fluids, antibiotics (IV Rocephin), and nausea medication. Will also perform I&D of abscess. Pt verbalized understanding and agreed to plan.   Labs Review Labs Reviewed  COMPREHENSIVE METABOLIC PANEL - Abnormal; Notable for the following:    Glucose, Bld 107 (*)    Calcium 8.7 (*)    All other components within normal limits  CBC WITH DIFFERENTIAL/PLATELET - Abnormal; Notable for the following:    WBC 11.1 (*)    Neutro Abs 8.4 (*)    Eosinophils Absolute 0.9 (*)    All other components within normal limits  WOUND  CULTURE  I-STAT CG4 LACTIC ACID, ED  I-STAT BETA HCG BLOOD, ED ([facility], [facility], [facility] ONLY)   INCISION AND DRAINAGE PROCEDURE NOTE: Patient identification was confirmed and verbal consent was obtained. This procedure was performed by Danelle Berry, PA-C, at 5:21 PM. Site: Right lower abdomen Sterile procedures observed Needle size: 27 Anesthetic used (type and amt): Lidocaine 2% w/ Epi, 1.5 mL Blade size: 11 Drainage: Moderate purulent and bloody drainage with waxy substance Complexity: Complex Site anesthetized, incision made over site, approx 1 cm opening, wound culture obtained, wound drained and explored loculations, rinsed with copious amounts of normal saline, covered with dry, sterile dressing.  Pt tolerated procedure well without complications.   Instructions for care discussed verbally and pt provided with additional written instructions for homecare and f/u.    MDM   Patient presented to the ER with abscess on her abdomen with history of hidradenitis, at triage she was  febrile and tachycardic, she endorsed mild nausea but no vomiting.  She was put on Bactrim yesterday by the doctor's office however there was no attempt to drain the abscess. She is managed by infectious disease, and has had a past admission for abscesses causing sepsis.  At the time of my evaluation the patient's vitals had normalized and basic labs resulted which were pertinent for a mild leukocytosis of 11.1, lactic acid was 1.33.  She was ambulatory, in NAD when she arrived to the unit, was requesting to eat.  She was given fluids, fentanyl and Zofran. I&D was performed.  Patient tolerated the procedure. She was able to eat and drink while in the ER.  She received IV dose of clindamycin.  The patient's temperature was 98 with a normal heart rate at the time of discharge. She is given a prescription for clindamycin and encouraged to continue warm soaks and follow up with her regular doctor for wound recheck in 2 days, or return  to the ER if sx worsened.  Final diagnoses:  Abscess   I personally performed the services described in this documentation, which was scribed in my presence. The recorded information has been reviewed and is accurate.   Danelle Berry, PA-C 12/26/14 1936  Lavera Guise, MD 12/27/14 [phone]

## 2014-12-26 NOTE — Discharge Instructions (Signed)
Incision and Drainage °Incision and drainage is a procedure in which a sac-like structure (cystic structure) is opened and drained. The area to be drained usually contains material such as pus, fluid, or blood.  °LET YOUR CAREGIVER KNOW ABOUT:  °· Allergies to medicine. °· Medicines taken, including vitamins, herbs, eyedrops, over-the-counter medicines, and creams. °· Use of steroids (by mouth or creams). °· Previous problems with anesthetics or numbing medicines. °· History of bleeding problems or blood clots. °· Previous surgery. °· Other health problems, including diabetes and kidney problems. °· Possibility of pregnancy, if this applies. °RISKS AND COMPLICATIONS °· Pain. °· Bleeding. °· Scarring. °· Infection. °BEFORE THE PROCEDURE  °You may need to have an ultrasound or other imaging tests to see how large or deep your cystic structure is. Blood tests may also be used to determine if you have an infection or how severe the infection is. You may need to have a tetanus shot. °PROCEDURE  °The affected area is cleaned with a cleaning fluid. The cyst area will then be numbed with a medicine (local anesthetic). A small incision will be made in the cystic structure. A syringe or catheter may be used to drain the contents of the cystic structure, or the contents may be squeezed out. The area will then be flushed with a cleansing solution. After cleansing the area, it is often gently packed with a gauze or another wound dressing. Once it is packed, it will be covered with gauze and tape or some other type of wound dressing.  °AFTER THE PROCEDURE  °· Often, you will be allowed to go home right after the procedure. °· You may be given antibiotic medicine to prevent or heal an infection. °· If the area was packed with gauze or some other wound dressing, you will likely need to come back in 1 to 2 days to get it removed. °· The area should heal in about 14 days. °  °This information is not intended to replace advice given  to you by your health care provider. Make sure you discuss any questions you have with your health care provider. °  °Document Released: 06/18/2000 Document Revised: 06/24/2011 Document Reviewed: 02/17/2011 °Elsevier Interactive Patient Education ©2016 Elsevier Inc. ° °Abscess °An abscess is an infected area that contains a collection of pus and debris. It can occur in almost any part of the body. An abscess is also known as a furuncle or boil. °CAUSES  °An abscess occurs when tissue gets infected. This can occur from blockage of oil or sweat glands, infection of hair follicles, or a minor injury to the skin. As the body tries to fight the infection, pus collects in the area and creates pressure under the skin. This pressure causes pain. People with weakened immune systems have difficulty fighting infections and get certain abscesses more often.  °SYMPTOMS °Usually an abscess develops on the skin and becomes a painful mass that is red, warm, and tender. If the abscess forms under the skin, you may feel a moveable soft area under the skin. Some abscesses break open (rupture) on their own, but most will continue to get worse without care. The infection can spread deeper into the body and eventually into the bloodstream, causing you to feel ill.  °DIAGNOSIS  °Your caregiver will take your medical history and perform a physical exam. A sample of fluid may also be taken from the abscess to determine what is causing your infection. °TREATMENT  °Your caregiver may prescribe antibiotic medicines to fight the infection.   However, taking antibiotics alone usually does not cure an abscess. Your caregiver may need to make a small cut (incision) in the abscess to drain the pus. In some cases, gauze is packed into the abscess to reduce pain and to continue draining the area. °HOME CARE INSTRUCTIONS  °· Only take over-the-counter or prescription medicines for pain, discomfort, or fever as directed by your caregiver. °· If you were  prescribed antibiotics, take them as directed. Finish them even if you start to feel better. °· If gauze is used, follow your caregiver's directions for changing the gauze. °· To avoid spreading the infection: °¨ Keep your draining abscess covered with a bandage. °¨ Wash your hands well. °¨ Do not share personal care items, towels, or whirlpools with others. °¨ Avoid skin contact with others. °· Keep your skin and clothes clean around the abscess. °· Keep all follow-up appointments as directed by your caregiver. °SEEK MEDICAL CARE IF:  °· You have increased pain, swelling, redness, fluid drainage, or bleeding. °· You have muscle aches, chills, or a general ill feeling. °· You have a fever. °MAKE SURE YOU:  °· Understand these instructions. °· Will watch your condition. °· Will get help right away if you are not doing well or get worse. °  °This information is not intended to replace advice given to you by your health care provider. Make sure you discuss any questions you have with your health care provider. °  °Document Released: 10/02/2004 Document Revised: 06/24/2011 Document Reviewed: 03/07/2011 °Elsevier Interactive Patient Education ©2016 Elsevier Inc. ° °

## 2014-12-29 ENCOUNTER — Telehealth (HOSPITAL_BASED_OUTPATIENT_CLINIC_OR_DEPARTMENT_OTHER): Payer: Self-pay | Admitting: Emergency Medicine

## 2014-12-29 LAB — WOUND CULTURE

## 2014-12-30 NOTE — Telephone Encounter (Signed)
Post ED Visit - Positive Culture Follow-up  Culture report reviewed by antimicrobial stewardship pharmacist:  []  Enzo BiNathan Batchelder, Pharm.D. []  Celedonio MiyamotoJeremy Frens, Pharm.D., BCPS []  Garvin FilaMike Maccia, Pharm.D. []  Georgina PillionElizabeth Martin, Pharm.D., BCPS []  ValleyMinh Pham, [address]Pharm[address]D., BCPS, AAHIVP []  Estella HuskMichelle Turner, Pharm.D., BCPS, AAHIVP []  Tennis Mustassie Stewart, Pharm.D. []  Rob Oswaldo DoneVincent, [address]Ph[address] XMadlyn Frankel- Allison M. Pharm D Positive wound culture Treated with clindamycin, organism sensitive to the same and no further patient follow-up is required at this time.  Welda JacobsFesterman, Nain Rudd C 12/30/2014, 12:32 PM

## 2015-01-15 ENCOUNTER — Ambulatory Visit: Payer: Medicaid Other | Admitting: Infectious Disease

## 2015-01-22 ENCOUNTER — Encounter: Payer: Self-pay | Admitting: Infectious Disease

## 2015-01-22 ENCOUNTER — Ambulatory Visit (INDEPENDENT_AMBULATORY_CARE_PROVIDER_SITE_OTHER): Payer: Medicaid Other | Admitting: Infectious Disease

## 2015-01-22 VITALS — BP 143/98 | HR 94 | Temp 98.3°F | Wt 211.0 lb

## 2015-01-22 DIAGNOSIS — L732 Hidradenitis suppurativa: Secondary | ICD-10-CM

## 2015-01-22 DIAGNOSIS — L02211 Cutaneous abscess of abdominal wall: Secondary | ICD-10-CM

## 2015-01-22 DIAGNOSIS — A4902 Methicillin resistant Staphylococcus aureus infection, unspecified site: Secondary | ICD-10-CM | POA: Diagnosis not present

## 2015-01-22 MED ORDER — SPIRONOLACTONE 50 MG PO TABS: 100.0000 mg | ORAL_TABLET | Freq: Every day | ORAL | Status: DC

## 2015-01-22 MED ORDER — DOXYCYCLINE HYCLATE 100 MG PO TABS: 100.0000 mg | ORAL_TABLET | Freq: Two times a day (BID) | ORAL | Status: DC

## 2015-01-22 NOTE — Patient Instructions (Signed)
We will see if a trial of 2 months of doxycycline 100mg  twice daily helps  I would like you to increase your dose of aldactone (spironolactone) to 100mg  daily  You can resume the topical clindamycin  Try a low "carb" diet such as Paleo or Atkins to both help with HS and weight  You will need a BMP lab checked in next few weeks to ensure that your potassium and kidney function are doing OK on higher dose of aldactone  Your PCP will need to make referral to Dermatology

## 2015-01-22 NOTE — Progress Notes (Signed)
Chief complaint:: Follow-up for recurrent abscesses, MRSA infection possible hidradenitis    Subjective:    Patient ID: Alexis Montgomery, female    DOB: April 16, 1990, 25 y.o.   MRN: [id number]  HPI   25 year old with hx of recurrent boils, skin infections since she was a child. Her sister suffers from a similar condition and it appears that she has been diagnosed with Hidradenitis suppurativa and is currently being managed by aa Dermatologist with TNF-antagonist therapy.  Ms Dolliver states that lesions previously appeared on buttocks under each armpit, and in recent flares especially under both breasts She was most recently rx with clindamycin with resolution of these lesions with scarring. She typically goes between 2 weeks before having another flare of infection. MRSA was isolated this year after I and D of lesion.  She has not tried decolonization regimen for staphylococcus  Aureus. In the past the lesions have in fact responded to amoxicillin suggesting that some of these have more likely been strep infections.   When I saw her last we switched her ACE inhibitor over to spironolactone to use some of its anti-androgen effects. In the interim we also had her on topical clindamycin but she only was able to take it for a few days. She also was admitted to the emerge department where she underwent I&D of a large abscess on her flank which grew methicillin-resistant staph aureus. She was treated with clindamycin for this.  She came back with information on a dermatologist that her and goes to asked if we could refer her to this dermatologist. For psychiatric due to says she is a Medicaid patient.  Past Medical History  Diagnosis Date  . Recurrent boils   . Pregnancy induced hypertension   . Contraceptive management 06/10/2012    nexplanon inserted left arm 06/10/12  . Anemia   . Hidradenitis suppurativa 10/09/2014  . MRSA infection 10/09/2014  . Benign essential HTN 10/09/2014  . Screen for STD  (sexually transmitted disease) 10/09/2014  . Morbid obesity (HCC) 10/09/2014    Past Surgical History  Procedure Laterality Date  . Cesarean section N/A 04/23/2012    Procedure: CESAREAN SECTION;  Surgeon: Tereso Newcomer, MD;  Location: [facility];  Service: Obstetrics;  Laterality: N/A;    Family History  Problem Relation Age of Onset  . HIV Mother   . Cancer Mother   . Diabetes Paternal Uncle   . Heart Problems Paternal Grandfather   . Other Sister     hidradenitis suppurativa      Social History   Social History  . Marital Status: Married    Spouse Name: N/A  . Number of Children: N/A  . Years of Education: N/A   Social History Main Topics  . Smoking status: Never Smoker   . Smokeless tobacco: Never Used  . Alcohol Use: No  . Drug Use: No  . Sexual Activity: Yes    Birth Control/ Protection: Implant, Condom   Other Topics Concern  . None   Social History Narrative    No Known Allergies   Current outpatient prescriptions:  .  acetaminophen (TYLENOL) 325 MG tablet, Take 650 mg by mouth every 6 (six) hours as needed for moderate pain., Disp: , Rfl:  .  clindamycin (CLEOCIN) 150 MG capsule, Take 2 capsules (300 mg total) by mouth 3 (three) times daily. May dispense as 150mg  capsules, Disp: 60 capsule, Rfl: 0 .  clindamycin (CLEOCIN-T) 1 % external solution, Apply topically 2 (two) times daily.,  Disp: 60 mL, Rfl: 1 .  etonogestrel (NEXPLANON) 68 MG IMPL implant, Inject 1 each into the skin once., Disp: , Rfl:  .  ibuprofen (ADVIL,MOTRIN) 600 MG tablet, Take 1 tablet (600 mg total) by mouth every 6 (six) hours as needed., Disp: 30 tablet, Rfl: 0 .  spironolactone (ALDACTONE) 50 MG tablet, Take 1 tablet (50 mg total) by mouth daily., Disp: 30 tablet, Rfl: 11   Review of Systems  Constitutional: Negative for fever, chills, diaphoresis, activity change, appetite change, fatigue and unexpected weight change.  HENT: Negative for congestion, rhinorrhea, sinus pressure,  sneezing, sore throat and trouble swallowing.   Eyes: Negative for photophobia and visual disturbance.  Respiratory: Negative for cough, chest tightness, shortness of breath, wheezing and stridor.   Cardiovascular: Negative for chest pain, palpitations and leg swelling.  Gastrointestinal: Negative for nausea, vomiting, abdominal pain, diarrhea, constipation, blood in stool, abdominal distention and anal bleeding.  Genitourinary: Negative for dysuria, hematuria, flank pain and difficulty urinating.  Musculoskeletal: Negative for myalgias, back pain, joint swelling, arthralgias and gait problem.  Skin: Positive for rash and wound. Negative for color change and pallor.  Neurological: Negative for dizziness, tremors, weakness and light-headedness.  Hematological: Negative for adenopathy. Does not bruise/bleed easily.  Psychiatric/Behavioral: Negative for behavioral problems, confusion, sleep disturbance, dysphoric mood, decreased concentration and agitation.       Objective:   Physical Exam  Constitutional: She is oriented to person, place, and time. She appears well-developed and well-nourished. No distress.  HENT:  Head: Normocephalic and atraumatic.  Mouth/Throat: No oropharyngeal exudate.  Eyes: Conjunctivae and EOM are normal. No scleral icterus.  Neck: Normal range of motion. Neck supple.  Cardiovascular: Normal rate and regular rhythm.   Pulmonary/Chest: Effort normal. No respiratory distress. She has no wheezes.    Abdominal: She exhibits no distension.  Musculoskeletal: She exhibits no edema or tenderness.  Neurological: She is alert and oriented to person, place, and time. She exhibits normal muscle tone. Coordination normal.  Skin: Skin is warm and dry. No rash noted. She is not diaphoretic. No erythema. No pallor.     Psychiatric: She has a normal mood and affect. Her behavior is normal. Judgment and thought content normal.          Assessment & Plan:   Likely  Hidradenitis suppurativa: given recurrent infections under armpits, under left breast other moist areas of the body and chronicity of condition with lesions that responded to non Staph aureus drugs such as amoxicillin and sister with condition would push this as likely underlying condition causing her recurrent infections  --Increase aldactone 200 mg for antiandrogen effect. 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 Lets first proceed with the doxycycline trial and trying to get her in with dermatology   Morbid obesity: low carbohydrate diet which might also help with HS  HTN: use aldactone instead  of ACEI for anti androgen effect  I spent greater than 25 minutes with the patient including greater than 50% of time in face to face counsel of the patient regarding her possible hidradenitis her recurrent MRSA infections her obesity and hypertension and in coordination of her care.

## 2015-03-26 ENCOUNTER — Ambulatory Visit: Payer: Medicaid Other | Admitting: Infectious Disease

## 2015-05-18 DIAGNOSIS — G8929 Other chronic pain: Secondary | ICD-10-CM | POA: Insufficient documentation

## 2015-05-18 DIAGNOSIS — M549 Dorsalgia, unspecified: Secondary | ICD-10-CM

## 2015-06-05 ENCOUNTER — Emergency Department (HOSPITAL_COMMUNITY)
Admission: EM | Admit: 2015-06-05 | Discharge: 2015-06-05 | Disposition: A | Discharge status: Home / Self Care | Payer: Medicaid Other | Attending: Emergency Medicine | Admitting: Emergency Medicine

## 2015-06-05 ENCOUNTER — Encounter (HOSPITAL_COMMUNITY): Payer: Self-pay | Admitting: Emergency Medicine

## 2015-06-05 ENCOUNTER — Emergency Department (HOSPITAL_COMMUNITY): Payer: Medicaid Other

## 2015-06-05 DIAGNOSIS — Z791 Long term (current) use of non-steroidal anti-inflammatories (NSAID): Secondary | ICD-10-CM | POA: Insufficient documentation

## 2015-06-05 DIAGNOSIS — S6992XA Unspecified injury of left wrist, hand and finger(s), initial encounter: Secondary | ICD-10-CM | POA: Diagnosis present

## 2015-06-05 DIAGNOSIS — S60512A Abrasion of left hand, initial encounter: Secondary | ICD-10-CM | POA: Diagnosis not present

## 2015-06-05 DIAGNOSIS — Y92410 Unspecified street and highway as the place of occurrence of the external cause: Secondary | ICD-10-CM | POA: Diagnosis not present

## 2015-06-05 DIAGNOSIS — Y939 Activity, unspecified: Secondary | ICD-10-CM | POA: Diagnosis not present

## 2015-06-05 DIAGNOSIS — Y999 Unspecified external cause status: Secondary | ICD-10-CM | POA: Insufficient documentation

## 2015-06-05 DIAGNOSIS — I10 Essential (primary) hypertension: Secondary | ICD-10-CM | POA: Insufficient documentation

## 2015-06-05 DIAGNOSIS — S60812A Abrasion of left wrist, initial encounter: Secondary | ICD-10-CM | POA: Diagnosis not present

## 2015-06-05 LAB — POC URINE PREG, ED: PREG TEST UR: NEGATIVE

## 2015-06-05 MED ORDER — NAPROXEN 250 MG PO TABS
500.0000 mg | ORAL_TABLET | Freq: Once | ORAL | Status: AC
  Administered 2015-06-05: 500 mg via ORAL
  Filled 2015-06-05: qty 2

## 2015-06-05 MED ORDER — DIAZEPAM 5 MG PO TABS
5.0000 mg | ORAL_TABLET | Freq: Once | ORAL | Status: AC
  Administered 2015-06-05: 5 mg via ORAL
  Filled 2015-06-05: qty 1
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
## 2015-06-05 NOTE — ED Notes (Signed)
Pt seen and evaluated by EDPa for initial assessment. 
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
## 2015-06-11 ENCOUNTER — Other Ambulatory Visit (HOSPITAL_COMMUNITY)
Admission: RE | Admit: 2015-06-11 | Discharge: 2015-06-11 | Disposition: A | Discharge status: Home / Self Care | Payer: Medicaid Other | Source: Ambulatory Visit | Attending: Adult Health | Admitting: Adult Health

## 2015-06-11 ENCOUNTER — Encounter: Payer: Self-pay | Admitting: Adult Health

## 2015-06-11 ENCOUNTER — Ambulatory Visit (INDEPENDENT_AMBULATORY_CARE_PROVIDER_SITE_OTHER): Payer: Medicaid Other | Admitting: Adult Health

## 2015-06-11 VITALS — BP 130/80 | HR 82 | Ht 62.0 in | Wt 212.0 lb

## 2015-06-11 DIAGNOSIS — Z01419 Encounter for gynecological examination (general) (routine) without abnormal findings: Secondary | ICD-10-CM | POA: Insufficient documentation

## 2015-06-11 DIAGNOSIS — Z0000 Encounter for general adult medical examination without abnormal findings: Secondary | ICD-10-CM

## 2015-06-11 DIAGNOSIS — Z124 Encounter for screening for malignant neoplasm of cervix: Secondary | ICD-10-CM

## 2015-06-11 DIAGNOSIS — Z113 Encounter for screening for infections with a predominantly sexual mode of transmission: Secondary | ICD-10-CM | POA: Diagnosis present

## 2015-06-11 DIAGNOSIS — Z975 Presence of (intrauterine) contraceptive device: Secondary | ICD-10-CM | POA: Insufficient documentation

## 2015-06-11 DIAGNOSIS — N921 Excessive and frequent menstruation with irregular cycle: Secondary | ICD-10-CM | POA: Insufficient documentation

## 2015-06-11 HISTORY — DX: Excessive and frequent menstruation with irregular cycle: N92.1

## 2015-06-11 MED ORDER — MEGESTROL ACETATE 40 MG PO TABS: ORAL_TABLET | ORAL | Status: DC

## 2015-06-11 NOTE — Progress Notes (Signed)
Patient ID: Alexis Montgomery, female   DOB: 11/12/1990, 25 y.o.   MRN: [id number] History of Present Illness: Alexis Montgomery is a 25 year old white female in for a well woman gyn exam and pap.She has nexplanon and it is due out today.She complains of heavy irregular bleeding.   Current Medications, Allergies, Past Medical History, Past Surgical History, Family History and Social History were reviewed in [facility] electronic medical record.     Review of Systems: Patient denies any headaches, hearing loss, fatigue, blurred vision, shortness of breath, chest pain, abdominal pain, problems with bowel movements, urination, or intercourse. No joint pain or mood swings. See HPI for positives.    Physical Exam:BP 130/80 mmHg  Pulse 82  Ht 5\' 2"  (1.575 m)  Wt 212 lb (96.163 kg)  BMI 38.77 kg/m2  LMP 06/07/2015 General:  Well developed, well nourished, no acute distress Skin:  Warm and dry, has increased number of moles Neck:  Midline trachea, normal thyroid, good ROM, no lymphadenopathy Lungs; Clear to auscultation bilaterally Breast:  No dominant palpable mass, retraction, or nipple discharge Cardiovascular: Regular rate and rhythm Abdomen:  Soft, non tender, no hepatosplenomegaly Pelvic:  External genitalia is normal in appearance, no lesions.  The vagina is normal in appearance,with period like blood. Urethra has no lesions or masses. The cervix is smooth, pap with GC/CHL performed.  Uterus is felt to be normal size, shape, and contour.  No adnexal masses or tenderness noted.Bladder is non tender, no masses felt. Extremities/musculoskeletal:  No swelling or varicosities noted, no clubbing or cyanosis,nexplanon palpated left arm Psych:  No mood changes, alert and cooperative,seems happy   Impression: Well woman gyn exam and pap Nexplanon in place Menorrhagia, irregular cycle    Plan: Order nexplanon No sex, return 7/3 for nexplanon removal and reinsertion with me Rx megace 40 mg #45 3 x 5  days then 2 x 5 days then 1 daily with 1 refill Physical in 1 year, pap in 3 if normal

## 2015-06-11 NOTE — Patient Instructions (Signed)
Physical in 1 year, pap in 3 if normal No sex, return 7/3 for nexplanon removal and reinsertion with me

## 2015-06-13 LAB — CYTOLOGY - PAP

## 2015-07-09 ENCOUNTER — Encounter: Payer: Self-pay | Admitting: Adult Health

## 2015-07-09 ENCOUNTER — Ambulatory Visit (INDEPENDENT_AMBULATORY_CARE_PROVIDER_SITE_OTHER): Payer: Medicaid Other | Admitting: Adult Health

## 2015-07-09 VITALS — BP 140/98 | HR 90 | Ht 62.0 in | Wt 204.0 lb

## 2015-07-09 DIAGNOSIS — Z30017 Encounter for initial prescription of implantable subdermal contraceptive: Secondary | ICD-10-CM

## 2015-07-09 DIAGNOSIS — Z3046 Encounter for surveillance of implantable subdermal contraceptive: Secondary | ICD-10-CM | POA: Diagnosis not present

## 2015-07-09 DIAGNOSIS — Z3202 Encounter for pregnancy test, result negative: Secondary | ICD-10-CM

## 2015-07-09 LAB — POCT URINE PREGNANCY: Preg Test, Ur: NEGATIVE

## 2015-07-09 NOTE — Patient Instructions (Signed)
Use condoms x 2 weeks, keep clean and dry x 24 hours, no heavy lifting, keep steri strips on x 72 hours, Keep pressure dressing on x 24 hours. Follow up prn problems   BP recheck 126/86 follow up with PCP

## 2015-07-09 NOTE — Progress Notes (Signed)
Subjective:     Patient ID: Alexis EllenAshley N Montgomery, female   DOB: 10/07/1990, 25 y.o.   MRN: [id number]  HPI Alexis Montgomery is a 25 year old white female in for nexplanon removal and reinsertion, has been on adipex for almost 2 weeks and has lost 8 lbs so far, was seen by PCP.  Review of Systems For nexplanon removal and reinsertion Reviewed past medical,surgical, social and family history. Reviewed medications and allergies.     Objective:   Physical Exam BP 140/98 mmHg  Pulse 90  Ht 5\' 2"  (1.575 m)  Wt 204 lb (92.534 kg)  BMI 37.30 kg/m2  LMP 06/07/2015 BP recheck 126/86 consent signed and time out called and left arm cleansed with betadine, and injected with 1.5 cc 2% lidocaine and waited til numb.Under sterile technique a #11 blade was used to make small vertical incision, and a curved forceps was used to easily remove rod. Then new Nexplanon easily inserted and steri strips applied.Rod easily palpated by provider and pt. Pressure dressing applied.    Assessment:      nexplanon removal nexplanon insertion lot [id number] exp 1/20     Plan:     Use condoms x 2 weeks, keep clean and dry x 24 hours, no heavy lifting, keep steri strips on x 72 hours, Keep pressure dressing on x 24 hours. Follow up prn problems.   F/U with PCP about BP recheck 126/86

## 2016-01-28 ENCOUNTER — Ambulatory Visit (INDEPENDENT_AMBULATORY_CARE_PROVIDER_SITE_OTHER): Payer: BLUE CROSS/BLUE SHIELD | Admitting: Adult Health

## 2016-01-28 ENCOUNTER — Encounter: Payer: Self-pay | Admitting: Adult Health

## 2016-01-28 VITALS — BP 130/90 | HR 90 | Ht 63.0 in | Wt 210.0 lb

## 2016-01-28 DIAGNOSIS — Z3049 Encounter for surveillance of other contraceptives: Secondary | ICD-10-CM | POA: Diagnosis not present

## 2016-01-28 DIAGNOSIS — Z3046 Encounter for surveillance of implantable subdermal contraceptive: Secondary | ICD-10-CM

## 2016-01-28 NOTE — Progress Notes (Signed)
Subjective:     Patient ID: Alexis Montgomery, female   DOB: 05/30/1990, 26 y.o.   MRN: [id number]  HPI Alexis Montgomery is a 26 year old white female in for nexplanon removal, has had irregular bleeding and wants it out.  Review of Systems For nexplanon removal + irregular bleeding with nexplanon Reviewed past medical,surgical, social and family history. Reviewed medications and allergies.     Objective:   Physical Exam BP 130/90 (BP Location: Left Arm, Patient Position: Sitting, Cuff Size: Large)   Pulse 90   Ht 5\' 3"  (1.6 m)   Wt 210 lb (95.3 kg)   BMI 37.20 kg/m Consent signed, time out called. Left arm cleansed with betadine, and injected with 1.5 cc 1% lidocaine and waited til numb.Under sterile technique a #11 blade was used to make small vertical incision, and a curved forceps was used to easily remove rod. Steri strips applied. Pressure dressing applied.   She declines birth control, will use condoms. PHQ 2 score 0. Assessment:     Nexplanon removal     Plan:     Use condoms, keep clean and dry x 24 hours, no heavy lifting, keep steri strips on x 72 hours, Keep pressure dressing on x 24 hours. Follow up prn problems.

## 2016-01-28 NOTE — Patient Instructions (Signed)
Use condoms, keep clean and dry x 24 hours, no heavy lifting, keep steri strips on x 72 hours, Keep pressure dressing on x 24 hours. Follow up prn problems.  

## 2016-02-26 DIAGNOSIS — I10 Essential (primary) hypertension: Secondary | ICD-10-CM | POA: Diagnosis not present

## 2016-02-26 DIAGNOSIS — Z6836 Body mass index (BMI) 36.0-36.9, adult: Secondary | ICD-10-CM | POA: Diagnosis not present

## 2016-02-26 DIAGNOSIS — Z3201 Encounter for pregnancy test, result positive: Secondary | ICD-10-CM | POA: Diagnosis not present

## 2016-03-03 ENCOUNTER — Encounter: Payer: Self-pay | Admitting: Women's Health

## 2016-03-03 ENCOUNTER — Ambulatory Visit (INDEPENDENT_AMBULATORY_CARE_PROVIDER_SITE_OTHER): Payer: BLUE CROSS/BLUE SHIELD | Admitting: Women's Health

## 2016-03-03 VITALS — BP 132/84 | HR 114 | Wt 212.8 lb

## 2016-03-03 DIAGNOSIS — I10 Essential (primary) hypertension: Secondary | ICD-10-CM

## 2016-03-03 DIAGNOSIS — O0990 Supervision of high risk pregnancy, unspecified, unspecified trimester: Secondary | ICD-10-CM

## 2016-03-03 DIAGNOSIS — Z8751 Personal history of pre-term labor: Secondary | ICD-10-CM | POA: Insufficient documentation

## 2016-03-03 DIAGNOSIS — Z3490 Encounter for supervision of normal pregnancy, unspecified, unspecified trimester: Secondary | ICD-10-CM

## 2016-03-03 DIAGNOSIS — Z3201 Encounter for pregnancy test, result positive: Secondary | ICD-10-CM | POA: Diagnosis not present

## 2016-03-03 DIAGNOSIS — R102 Pelvic and perineal pain: Secondary | ICD-10-CM

## 2016-03-03 DIAGNOSIS — O09299 Supervision of pregnancy with other poor reproductive or obstetric history, unspecified trimester: Secondary | ICD-10-CM | POA: Insufficient documentation

## 2016-03-03 DIAGNOSIS — O10919 Unspecified pre-existing hypertension complicating pregnancy, unspecified trimester: Secondary | ICD-10-CM

## 2016-03-03 DIAGNOSIS — O3680X0 Pregnancy with inconclusive fetal viability, not applicable or unspecified: Secondary | ICD-10-CM

## 2016-03-03 DIAGNOSIS — Z98891 History of uterine scar from previous surgery: Secondary | ICD-10-CM | POA: Insufficient documentation

## 2016-03-03 LAB — POCT URINE PREGNANCY: Preg Test, Ur: POSITIVE — AB

## 2016-03-03 MED ORDER — LABETALOL HCL 100 MG PO TABS: 100.0000 mg | ORAL_TABLET | Freq: Two times a day (BID) | ORAL | 6 refills | Status: DC

## 2016-03-03 NOTE — Patient Instructions (Addendum)
Stop norvasc (amlodopine), start labetalol $RemoveBeforeD [id number], if consistently >140 on top or >90 on bottom, let us know  Nausea & Vomiting  Have saltine crackers or pretzels by your bed and eat a few bites before you raise your head out of bed in the morning  Eat small frequent meals throughout the day instead of large meals  Drink plenty of fluids throughout the day to stay hydrated, just don't drink a lot of fluids with your meals.  This can make your stomach fill up faster making you feel sick  Do not brush your teeth right after you eat  Products with real ginger are good for nausea, like ginger ale and ginger hard candy Make sure it says made with real ginger!  Sucking on sour candy like lemon heads is also good for nausea  If your prenatal vitamins make you nauseated, take them at night so you will sleep through the nausea  Sea Bands  If you feel like you need medicine for the nausea & vomiting please let us know  If you are unable to keep any fluids or food down please let us know     First Trimester of Pregnancy The first trimester of pregnancy is from week 1 until the end of week 12 (months 1 through 3). A week after a sperm fertilizes an egg, the egg will implant on the wall of the uterus. This embryo will begin to develop into a baby. Genes from you and your partner are forming the baby. The female genes determine whether the baby is a boy or a girl. At 6-8 weeks, the eyes and face are formed, and the heartbeat can be seen on ultrasound. At the end of 12 weeks, all the baby's organs are formed.  Now that you are pregnant, you will want to do everything you can to have a healthy baby. Two of the most important things are to get good prenatal care and to follow your health care provider's instructions. Prenatal care is all the medical care you receive before the baby's birth. This care will help prevent, find, and treat any problems during  the pregnancy and childbirth. BODY CHANGES Your body goes through many changes during pregnancy. The changes vary from woman to woman.   You may gain or lose a couple of pounds at first.  You may feel sick to your stomach (nauseous) and throw up (vomit). If the vomiting is uncontrollable, call your health care provider.  You may tire easily.  You may develop headaches that can be relieved by medicines approved by your health care provider.  You may urinate more often. Painful urination may mean you have a bladder infection.  You may develop heartburn as a result of your pregnancy.  You may develop constipation because certain hormones are causing the muscles that push waste through your intestines to slow down.  You may develop hemorrhoids or swollen, bulging veins (varicose veins).  Your breasts may begin to grow larger and become tender. Your nipples may stick out more, and the tissue that surrounds them (areola) may become darker.  Your gums may bleed and may be sensitive to brushing and flossing.  Dark spots or blotches (chloasma, mask of pregnancy) may develop on your face. This will likely fade after the baby is born.  Your menstrual periods will stop.  You may have a loss of appetite.  You may develop cravings for certain kinds of food.  You  may have changes in your emotions from day to day, such as being excited to be pregnant or being concerned that something may go wrong with the pregnancy and baby.  You may have more vivid and strange dreams.  You may have changes in your hair. These can include thickening of your hair, rapid growth, and changes in texture. Some women also have hair loss during or after pregnancy, or hair that feels dry or thin. Your hair will most likely return to normal after your baby is born. WHAT TO EXPECT AT YOUR PRENATAL VISITS During a routine prenatal visit:  You will be weighed to make sure you and the baby are growing normally.  Your  blood pressure will be taken.  Your abdomen will be measured to track your baby's growth.  The fetal heartbeat will be listened to starting around week 10 or 12 of your pregnancy.  Test results from any previous visits will be discussed. Your health care provider may ask you:  How you are feeling.  If you are feeling the baby move.  If you have had any abnormal symptoms, such as leaking fluid, bleeding, severe headaches, or abdominal cramping.  If you are using any tobacco products, including cigarettes, chewing tobacco, and electronic cigarettes.  If you have any questions. Other tests that may be performed during your first trimester include:  Blood tests to find your blood type and to check for the presence of any previous infections. They will also be used to check for low iron levels (anemia) and Rh antibodies. Later in the pregnancy, blood tests for diabetes will be done along with other tests if problems develop.  Urine tests to check for infections, diabetes, or protein in the urine.  An ultrasound to confirm the proper growth and development of the baby.  An amniocentesis to check for possible genetic problems.  Fetal screens for spina bifida and Down syndrome.  You may need other tests to make sure you and the baby are doing well.  HIV (human immunodeficiency virus) testing. Routine prenatal testing includes screening for HIV, unless you choose not to have this test. HOME CARE INSTRUCTIONS  Medicines   Follow your health care provider's instructions regarding medicine use. Specific medicines may be either safe or unsafe to take during pregnancy.  Take your prenatal vitamins as directed.  If you develop constipation, try taking a stool softener if your health care provider approves. Diet   Eat regular, well-balanced meals. Choose a variety of foods, such as meat or vegetable-based protein, fish, milk and low-fat dairy products, vegetables, fruits, and whole grain  breads and cereals. Your health care provider will help you determine the amount of weight gain that is right for you.  Avoid raw meat and uncooked cheese. These carry germs that can cause birth defects in the baby.  Eating four or five small meals rather than three large meals a day may help relieve nausea and vomiting. If you start to feel nauseous, eating a few soda crackers can be helpful. Drinking liquids between meals instead of during meals also seems to help nausea and vomiting.  If you develop constipation, eat more high-fiber foods, such as fresh vegetables or fruit and whole grains. Drink enough fluids to keep your urine clear or pale yellow. Activity and Exercise   Exercise only as directed by your health care provider. Exercising will help you:  Control your weight.  Stay in shape.  Be prepared for labor and delivery.  Experiencing pain or  cramping in the lower abdomen or low back is a good sign that you should stop exercising. Check with your health care provider before continuing normal exercises.  Try to avoid standing for long periods of time. Move your legs often if you must stand in one place for a long time.  Avoid heavy lifting.  Wear low-heeled shoes, and practice good posture.  You may continue to have sex unless your health care provider directs you otherwise. Relief of Pain or Discomfort   Wear a good support bra for breast tenderness.   Take warm sitz baths to soothe any pain or discomfort caused by hemorrhoids. Use hemorrhoid cream if your health care provider approves.   Rest with your legs elevated if you have leg cramps or low back pain.  If you develop varicose veins in your legs, wear support hose. Elevate your feet for 15 minutes, 3-4 times a day. Limit salt in your diet. Prenatal Care   Schedule your prenatal visits by the twelfth week of pregnancy. They are usually scheduled monthly at first, then more often in the last 2 months before  delivery.  Write down your questions. Take them to your prenatal visits.  Keep all your prenatal visits as directed by your health care provider. Safety   Wear your seat belt at all times when driving.  Make a list of emergency phone numbers, including numbers for family, friends, the hospital, and police and fire departments. General Tips   Ask your health care provider for a referral to a local prenatal education class. Begin classes no later than at the beginning of month 6 of your pregnancy.  Ask for help if you have counseling or nutritional needs during pregnancy. Your health care provider can offer advice or refer you to specialists for help with various needs.  Do not use hot tubs, steam rooms, or saunas.  Do not douche or use tampons or scented sanitary pads.  Do not cross your legs for long periods of time.  Avoid cat litter boxes and soil used by cats. These carry germs that can cause birth defects in the baby and possibly loss of the fetus by miscarriage or stillbirth.  Avoid all smoking, herbs, alcohol, and medicines not prescribed by your health care provider. Chemicals in these affect the formation and growth of the baby.  Do not use any tobacco products, including cigarettes, chewing tobacco, and electronic cigarettes. If you need help quitting, ask your health care provider. You may receive counseling support and other resources to help you quit.  Schedule a dentist appointment. At home, brush your teeth with a soft toothbrush and be gentle when you floss. SEEK MEDICAL CARE IF:   You have dizziness.  You have mild pelvic cramps, pelvic pressure, or nagging pain in the abdominal area.  You have persistent nausea, vomiting, or diarrhea.  You have a bad smelling vaginal discharge.  You have pain with urination.  You notice increased swelling in your face, hands, legs, or ankles. SEEK IMMEDIATE MEDICAL CARE IF:   You have a fever.  You are leaking fluid from  your vagina.  You have spotting or bleeding from your vagina.  You have severe abdominal cramping or pain.  You have rapid weight gain or loss.  You vomit blood or material that looks like coffee grounds.  You are exposed to [place] measles and have never had them.  You are exposed to fifth disease or chickenpox.  You develop a severe headache.  You have  shortness of breath.  You have any kind of trauma, such as from a fall or a car accident. This information is not intended to replace advice given to you by your health care provider. Make sure you discuss any questions you have with your health care provider. Document Released: 12/17/2000 Document Revised: 01/13/2014 Document Reviewed: 11/02/2012 Elsevier Interactive Patient Education  2017 [employer].

## 2016-03-03 NOTE — Progress Notes (Signed)
   [facility] Visit  Patient name: Alexis Montgomery MRN [id number]  Date of birth: 08/19/1990  CC & HPI:  Alexis Ellenshley N Grine is a 26 y.o. [id number] Caucasian female presenting today for +HPT. Just had nexplanon removed 01/28/16 d/t irregular bleeding. Has not had any bleeding since nexplanon removed. Took HPT b/c she had a bad headache, took bp and it was normal, which is not normal for her. Was not planning pregnancy, but wasn't doing anything to prevent it. Had to take clomid to get pregnant w/ last baby. Is taking pnv. No n/v. Does have CHTN- on norvasc 5mg  daily when she remembers to take it. H/O severe pre-e w/ IOL @ 35wks which ended in PLTCS d/t FTP last pregnancy. HTN just never went away. Some Lt sided lower pelvic pain that comes and goes- bad the other night when she had to be on her feet for 3hrs straight at work. No bleeding/cramping or other sx.  No LMP recorded.   Pertinent History Reviewed:  Medical & Surgical Hx:   Past medical, surgical, family, and social history reviewed in electronic medical record Medications: Reviewed & Updated - see associated section Allergies: Reviewed in electronic medical record  Objective Findings:  Vitals: BP 132/84 (BP Location: Right Arm, Patient Position: Sitting, Cuff Size: Normal)   Pulse (!) 114   Wt 212 lb 12.8 oz (96.5 kg)   BMI 37.70 kg/m  Body mass index is 37.7 kg/m.  Physical Examination: General appearance - alert, well appearing, and in no distress  Results for orders placed or performed in visit on 03/03/16 (from the past 24 hour(s))  POCT urine pregnancy   Collection Time: 03/03/16  2:23 PM  Result Value Ref Range   Preg Test, Ur Positive (A) Negative     Assessment & Plan:  A:   Pregnant, unknown gestational age  67CHTN  H/O severe pre-e w/ c/s  P:  Stop norvasc, rx labetalol 100mg  bid, take bp bid- if consistently >140 or >90 let us know  Recommend baby asa @ 12wks  Discussed Lt sided pain sounds like RLP, also  discussed s/s ectopic pregnancy/reasons to seek care  Return in about 2 weeks (around 03/17/2016) for dating u/s .  Marge DuncansBooker, Christyann Manolis Randall CNM, [facility]BC 03/03/2016 2:43 PM

## 2016-03-05 ENCOUNTER — Telehealth: Payer: Self-pay | Admitting: Women's Health

## 2016-03-05 ENCOUNTER — Telehealth: Payer: Self-pay | Admitting: *Deleted

## 2016-03-05 DIAGNOSIS — Z6837 Body mass index (BMI) 37.0-37.9, adult: Secondary | ICD-10-CM | POA: Diagnosis not present

## 2016-03-05 DIAGNOSIS — L03319 Cellulitis of trunk, unspecified: Secondary | ICD-10-CM | POA: Diagnosis not present

## 2016-03-05 NOTE — Telephone Encounter (Signed)
Pt states that she came in on Monday and seen Selena BattenKim on her appointment, Selena BattenKim has given her a diagnose. Pt states that she is on a lot of pain and she is at work and would like to talk to a nurse regarding this matter. Please contact pt

## 2016-03-05 NOTE — Telephone Encounter (Signed)
Patient called stating she has Hidradenitis and has an area under her breast that is getting bigger and is painful. Since she is pregnant, she doesn't know if she needs to come to us or go to [facility]. Spoke with Drenda FreezeFran who advised patient to go to [facility] if possible since they are familiar with her and her diagnosis. Pt verbalized understanding.

## 2016-03-05 NOTE — Telephone Encounter (Signed)
Pt left message on nurse voice mail yesterday stating that she had been seen the previous Alexis Montgomery (2/26) and forgot to ask about the boil she has which is hurting. She wants to know what we recommend. Per chart review, pt was seen @ [facility] location. I called pt this morning and provided their office telephone number so she can call for advise.  She voiced understanding.

## 2016-03-05 NOTE — Telephone Encounter (Signed)
LMOVM to call back 

## 2016-03-17 ENCOUNTER — Other Ambulatory Visit: Payer: BLUE CROSS/BLUE SHIELD

## 2016-03-17 ENCOUNTER — Telehealth: Payer: Self-pay | Admitting: *Deleted

## 2016-03-17 MED ORDER — PROMETHAZINE HCL 25 MG PO TABS: 12.5000 mg | ORAL_TABLET | Freq: Four times a day (QID) | ORAL | 0 refills | Status: DC | PRN

## 2016-03-17 NOTE — Telephone Encounter (Signed)
Husband called stating his wife was having heavier discharge and some light pink spotting, he is concerned. Informed him that spotting and discharge can occur during pregnancy and throughout until birth. Advised him to encourage her to push fluids, empty bladder frequently and to monitor bleeding. If she started saturating more that 2 pads an hour, to call us [facility] Verbalized understanding.   Would also like something for nausea. Please advise.

## 2016-03-17 NOTE — Addendum Note (Signed)
Addended by: Cheral MarkerBOOKER, Rodneisha Bonnet R on: 03/17/2016 12:48 PM   Modules accepted: Orders

## 2016-03-18 ENCOUNTER — Telehealth: Payer: Self-pay | Admitting: *Deleted

## 2016-03-18 ENCOUNTER — Encounter: Payer: Self-pay | Admitting: Women's Health

## 2016-03-18 NOTE — Telephone Encounter (Signed)
Spoke with patient to inform her prescription was sent to pharmacy. She states she is only having light spotting when she wipes and only very mild cramping. Advised [facility] if she felt like she needed to be examined. Pt verbalized understanding. She would like appointment to be moved if possible from next Monday until sometime this week. Will call patient back when office opens.

## 2016-03-21 ENCOUNTER — Other Ambulatory Visit: Payer: Self-pay | Admitting: Women's Health

## 2016-03-21 ENCOUNTER — Other Ambulatory Visit: Payer: BLUE CROSS/BLUE SHIELD

## 2016-03-21 ENCOUNTER — Ambulatory Visit (INDEPENDENT_AMBULATORY_CARE_PROVIDER_SITE_OTHER): Payer: BLUE CROSS/BLUE SHIELD

## 2016-03-21 DIAGNOSIS — Z3A09 9 weeks gestation of pregnancy: Secondary | ICD-10-CM

## 2016-03-21 DIAGNOSIS — O3680X0 Pregnancy with inconclusive fetal viability, not applicable or unspecified: Secondary | ICD-10-CM | POA: Diagnosis not present

## 2016-03-21 NOTE — Progress Notes (Signed)
US 9+1 wks GS, no fetal pole or ys seen,normal ov's bilat,pt will have labs today and f/u US 1 wk,per Victorino DikeJennifer

## 2016-03-22 ENCOUNTER — Encounter: Payer: Self-pay | Admitting: Women's Health

## 2016-03-22 LAB — BETA HCG QUANT (REF LAB): hCG Quant: 81131 m[IU]/mL

## 2016-03-24 ENCOUNTER — Other Ambulatory Visit: Payer: BLUE CROSS/BLUE SHIELD

## 2016-03-24 DIAGNOSIS — L732 Hidradenitis suppurativa: Secondary | ICD-10-CM | POA: Diagnosis not present

## 2016-03-26 ENCOUNTER — Telehealth: Payer: Self-pay | Admitting: Obstetrics & Gynecology

## 2016-03-26 NOTE — Telephone Encounter (Signed)
Question about nausea medication and nasal spray for wife, Morrie Sheldonshley.

## 2016-03-26 NOTE — Telephone Encounter (Signed)
LMOVM to return call.

## 2016-03-26 NOTE — Telephone Encounter (Signed)
[facility] called stating the phenergan made patient sleepy. Requesting other remedies. Advised she could try Unisom 25mg  in am and 25mg  in pm or 50 mg at night. She could also try Vitamin B6 25mg  4 times per day or 50mg  twice per day. Advised she could use saline nose spray.Verbalized understanding. Will try

## 2016-03-28 ENCOUNTER — Other Ambulatory Visit: Payer: Self-pay | Admitting: Adult Health

## 2016-03-28 DIAGNOSIS — O3680X0 Pregnancy with inconclusive fetal viability, not applicable or unspecified: Secondary | ICD-10-CM

## 2016-03-28 DIAGNOSIS — O3680X1 Pregnancy with inconclusive fetal viability, fetus 1: Secondary | ICD-10-CM

## 2016-03-31 ENCOUNTER — Ambulatory Visit (INDEPENDENT_AMBULATORY_CARE_PROVIDER_SITE_OTHER): Payer: BLUE CROSS/BLUE SHIELD

## 2016-03-31 ENCOUNTER — Encounter: Payer: Self-pay | Admitting: Women's Health

## 2016-03-31 ENCOUNTER — Ambulatory Visit (INDEPENDENT_AMBULATORY_CARE_PROVIDER_SITE_OTHER): Payer: BLUE CROSS/BLUE SHIELD | Admitting: Women's Health

## 2016-03-31 VITALS — BP 146/84 | HR 72 | Ht 63.0 in | Wt 220.0 lb

## 2016-03-31 DIAGNOSIS — I10 Essential (primary) hypertension: Secondary | ICD-10-CM

## 2016-03-31 DIAGNOSIS — O020 Blighted ovum and nonhydatidiform mole: Secondary | ICD-10-CM | POA: Diagnosis not present

## 2016-03-31 DIAGNOSIS — O3680X0 Pregnancy with inconclusive fetal viability, not applicable or unspecified: Secondary | ICD-10-CM

## 2016-03-31 NOTE — Progress Notes (Signed)
US 8+6 wks GS 36.4 mm,no fetal pole or ys,normal ovaries bilat,

## 2016-03-31 NOTE — Progress Notes (Signed)
[facility] Visit  Patient name: Alexis Montgomery MRN [id number]  Date of birth: November 12, 1990  CC & HPI:  Alexis Montgomery is a 26 y.o. G79P0101 Caucasian female presenting today for f/u u/s. Nexplanon removed 1/22, no period after having removed, then had +HPT in mid Feb. Had dating u/s 3/16 w/ gestational sac measuring 38.72mm c/w [redacted]w[redacted]d gestation, however no fetal pole or yolk sac and per final report by JVF small area of subchorionic fluid and gs separation beginning. Repeated u/s today, gestational sac now 36.58mm c/w [redacted]w[redacted]d still w/o fetal pole or yolk sac, gs located in mid to LUS. Pt denies cramping/bleeding. Has CHTN and was switched from norvasc to Labetalol 100mg  bid w/ +PT, states she has been taking as directed 'most of the time'. Does want to try again for another pregnancy.  No LMP recorded (lmp unknown). Patient is pregnant.  Pertinent History Reviewed:  Medical & Surgical Hx:   Past medical, surgical, family, and social history reviewed in electronic medical record Medications: Reviewed & Updated - see associated section Allergies: Reviewed in electronic medical record  Objective Findings:  Vitals: BP (!) 162/88 (BP Location: Right Arm, Patient Position: Sitting, Cuff Size: Large)   Pulse 72   Ht 5\' 3"  (1.6 m)   Wt 220 lb (99.8 kg)   LMP  (LMP Unknown)   BMI 38.97 kg/m  Body mass index is 38.97 kg/m. BP recheck 146/84  Physical Examination: General appearance - alert, well appearing, and in no distress  Today's u/s:  Alexis Montgomery is in the office for a follow up sonogram for viability.  She is a 26 y.o. year old G93P0101 with unknown LMP. S/P Nexplanon removal 01/28/2016. Thus far the pregnancy has been complicated by GS w/no fetal pole or ys on previous ultrasound.Marland KitchenQHCG 81,131 on 03/25/2016.  GESTATION: SINGLETON  FETAL ACTIVITY:          Heart rate         No fetal pole     CERVIX: Appears closed   ADNEXA: The ovaries are normal.   GESTATIONAL AGE AND   BIOMETRICS:  Gestational criteria: Estimated Date of Delivery: None noted,unknown LMP  Previous Scans:1  GESTATIONAL SAC           36.4 mm         8+6 weeks  CROWN RUMP LENGTH           No fetal pole or ys                                                                                AVERAGE EGA(BY THIS SCAN):  8+6 weeks                                       SUSPECTED ABNORMALITIES:  yes, no fetal pole or ys  QUALITY OF SCAN: satisfactory  TECHNICIAN COMMENTS:  [place] 8+6 wks GS 36.4 mm,GS is located in the mid to LUS,no fetal pole or ys,normal ovaries bilat  A copy of this report including all images has been saved and backed up to a second  source for retrieval if needed. All measures and details of the anatomical scan, placentation, fluid volume and pelvic anatomy are contained in that report. Alexis Flora LippsJ Carl 03/31/2016 4:28 PM   Assessment & Plan:  A:   Blighted ovum  CHTN  Rh+  P:  Discussed options of expectant management vs. cytotec. Wants to go home and think about it/discuss more w/ husband. Will call back if she decides for cytotec.   Discussed what to expect w/ miscarriage  Will continue labetalol for right now as they are going to try again asap for pregnancy once this is complete  Return in about 1 week (around 04/07/2016) for F/U.  Marge DuncansBooker, Kimberly Randall CNM, [facility] 03/31/2016 4:45 PM

## 2016-03-31 NOTE — Patient Instructions (Signed)
FACTS YOU SHOULD KNOW  About Early Pregnancy Loss  WHAT IS AN EARLY PREGNANCY LOSS? Once the egg is fertilized with the sperm and begins to develop, it attaches to the lining of the uterus. This early pregnancy tissue may not develop into an embryo (the beginning stage of a baby). Sometimes an embryo does develop but does not continue to grow. These problems can be seen on ultrasound.   MANAGEMNT OF EARLY PREGNANCY LOSS: About 4 out of 100 (0.25%) women will have a pregnancy loss in her lifetime.  One in five pregnancies is found to be an early pregnancy loss.  There are 3 ways to care for an early pregnancy loss:   (1) Surgery, (2) Medicine, (3) Waiting for you to pass the pregnancy on your own. The decision as to how to proceed after being diagnosed with and early pregnancy loss is an individual one.  The decision can be made only after appropriate counseling.  You need to weigh the pros and cons of the 3 choices. Then you can make the choice that works for you.  Medicine (CYTOTEC) . The complete procedure may take days to weeks . No Surgery . Bleeding may be heavy at times . There may be drug side effects . Patient has more control Waiting . You may choose to wait, in which case your own body may complete the passing of the abnormal early pregnancy on its own in about 2-4 weeks . Your bleeding may be heavy at times . There is a small possibility that you may need surgery if the bleeding is too much or not all of the pregnancy has passed.  CYTOTEC MANAGEMENT Prostaglandins (cytotec) are the most widely used drug for this purpose. They cause the uterus to cramp and contract. You will place the medicine yourself inside your vagina in the privacy of your home. Empting of the uterus should occur within 3 days but the process may continue for several weeks. The bleeding may seem heavy at times.  INSTRUCTIONS: Take all 4 tablets of cytotec (800mcg total) at one time. This will cause a lot of  cramping, you may have bleeding, and pass tissue, then the cramping and bleeding should get better. If you do not pass the tissue, then you can take 4 more tablets of cytotec (800mcg total) 48 hours after your first dose.  You will come back to have your blood drawn to make sure the pregnancy hormones are dropping in 1 week. Please call us if you have any questions.   POSSIBLE SIDE EFFECTS FROM CYTOTEC . Nausea  Vomiting . Diarrhea Fever . Chills  Hot Flashes Side effects  from the process of the early pregnancy loss include: . Cramping  Bleeding . Headaches  Dizziness RISKS: This is a low risk procedure. Less than 1 in 100 women has a complication. An incomplete passage of the early pregnancy may occur. Also, hemorrhage (heavy bleeding) could happen.  Rarely the pregnancy will not be passed completely. Excessively heavy bleeding may occur.  Your doctor may need to perform surgery to empty the uterus (D&E). Afterwards: Everybody will feel differently after the early pregnancy loss completion. You may have soreness or cramps for a day or two. You may have soreness or cramps for day or two.  You may have light bleeding for up to 2 weeks. You may be as active as you feel like being. If you have any of the following problems you may call [facility] at [phone] or [facility]aternity Admissions  Unit at [phone] if it is after hours. . If you have pain that does not get better with pain medication . Bleeding that soaks through 2 thick full-sized sanitary pads in an hour . Cramps that last longer than 2 days . Foul smelling discharge . Fever above 100.4 degrees F Even if you do not have any of these symptoms, you should have a follow-up exam to make sure you are healing properly. Your next normal period will usually start again in 4-6 week after the loss. You can get pregnant soon after the loss, so use birth control right away. Finally: Make sure all your questions are answered before during and after  any procedure. Follow up with medical care and family planning methods.      Miscarriage A miscarriage is the sudden loss of an unborn baby (fetus) before the 20th week of pregnancy. Most miscarriages happen in the first 3 months of pregnancy. Sometimes, it happens before a woman even knows she is pregnant. A miscarriage is also called a "spontaneous miscarriage" or "early pregnancy loss." Having a miscarriage can be an emotional experience. Talk with your caregiver about any questions you may have about miscarrying, the grieving process, and your future pregnancy plans. What are the causes?  Problems with the fetal chromosomes that make it impossible for the baby to develop normally. Problems with the baby's genes or chromosomes are most often the result of errors that occur, by chance, as the embryo divides and grows. The problems are not inherited from the parents.  Infection of the cervix or uterus.  Hormone problems.  Problems with the cervix, such as having an incompetent cervix. This is when the tissue in the cervix is not strong enough to hold the pregnancy.  Problems with the uterus, such as an abnormally shaped uterus, uterine fibroids, or congenital abnormalities.  Certain medical conditions.  Smoking, drinking alcohol, or taking illegal drugs.  Trauma. Often, the cause of a miscarriage is unknown. What are the signs or symptoms?  Vaginal bleeding or spotting, with or without cramps or pain.  Pain or cramping in the abdomen or lower back.  Passing fluid, tissue, or blood clots from the vagina. How is this diagnosed? Your caregiver will perform a physical exam. You may also have an ultrasound to confirm the miscarriage. Blood or urine tests may also be ordered. How is this treated?  Sometimes, treatment is not necessary if you naturally pass all the fetal tissue that was in the uterus. If some of the fetus or placenta remains in the body (incomplete miscarriage), tissue  left behind may become infected and must be removed. Usually, a dilation and curettage (D and C) procedure is performed. During a D and C procedure, the cervix is widened (dilated) and any remaining fetal or placental tissue is gently removed from the uterus.  Antibiotic medicines are prescribed if there is an infection. Other medicines may be given to reduce the size of the uterus (contract) if there is a lot of bleeding.  If you have Rh negative blood and your baby was Rh positive, you will need a Rh immunoglobulin shot. This shot will protect any future baby from having Rh blood problems in future pregnancies. Follow these instructions at home:  Your caregiver may order bed rest or may allow you to continue light activity. Resume activity as directed by your caregiver.  Have someone help with home and family responsibilities during this time.  Keep track of the number of sanitary pads you  use each day and how soaked (saturated) they are. Write down this information.  Do not use tampons. Do not douche or have sexual intercourse until approved by your caregiver.  Only take over-the-counter or prescription medicines for pain or discomfort as directed by your caregiver.  Do not take aspirin. Aspirin can cause bleeding.  Keep all follow-up appointments with your caregiver.  If you or your partner have problems with grieving, talk to your caregiver or seek counseling to help cope with the pregnancy loss. Allow enough time to grieve before trying to get pregnant again. Get help right away if:  You have severe cramps or pain in your back or abdomen.  You have a fever.  You pass large blood clots (walnut-sized or larger) ortissue from your vagina. Save any tissue for your caregiver to inspect.  Your bleeding increases.  You have a thick, bad-smelling vaginal discharge.  You become lightheaded, weak, or you faint.  You have chills. This information is not intended to replace advice  given to you by your health care provider. Make sure you discuss any questions you have with your health care provider. Document Released: 06/18/2000 Document Revised: 05/31/2015 Document Reviewed: 02/11/2011 Elsevier Interactive Patient Education  2017 [employer].

## 2016-04-01 ENCOUNTER — Telehealth: Payer: Self-pay | Admitting: Women's Health

## 2016-04-01 NOTE — Telephone Encounter (Signed)
Pt called LMOM that she needed her meds called into [facility] she said she is having a miscarriage.

## 2016-04-07 ENCOUNTER — Ambulatory Visit (INDEPENDENT_AMBULATORY_CARE_PROVIDER_SITE_OTHER): Payer: BLUE CROSS/BLUE SHIELD | Admitting: Women's Health

## 2016-04-07 ENCOUNTER — Encounter: Payer: Self-pay | Admitting: Women's Health

## 2016-04-07 VITALS — BP 130/78 | HR 96 | Ht 62.0 in | Wt 220.0 lb

## 2016-04-07 DIAGNOSIS — O021 Missed abortion: Secondary | ICD-10-CM | POA: Diagnosis not present

## 2016-04-07 DIAGNOSIS — O020 Blighted ovum and nonhydatidiform mole: Secondary | ICD-10-CM | POA: Diagnosis not present

## 2016-04-07 MED ORDER — HYDROCODONE-ACETAMINOPHEN 5-325 MG PO TABS: 1.0000 | ORAL_TABLET | Freq: Four times a day (QID) | ORAL | 0 refills | Status: DC | PRN

## 2016-04-07 MED ORDER — MISOPROSTOL 200 MCG PO TABS: 800.0000 ug | ORAL_TABLET | Freq: Once | ORAL | 1 refills | Status: DC

## 2016-04-07 NOTE — Progress Notes (Signed)
   [facility] Visit  Patient name: Alexis Montgomery MRN [id number]  Date of birth: September 08, 1990  CC & HPI:  Alexis Montgomery is a 26 y.o. G24P0101 Caucasian female presenting today for 1wk f/u after blighted ovum dx. Discussed options of expectant management vs. cytotec last week, wanted to go home and think about it and would call if wanted cytotec. Decided not to do cytotec yet. Has not started bleeding or cramping. Still having some nausea. Discussed going ahead and starting cytotec, which she agrees with.   No LMP recorded (lmp unknown). Patient is pregnant.  Pertinent History Reviewed:  Medical & Surgical Hx:   Past medical, surgical, family, and social history reviewed in electronic medical record Medications: Reviewed & Updated - see associated section Allergies: Reviewed in electronic medical record  Objective Findings:  Vitals: BP 130/78 (BP Location: Right Arm, Patient Position: Sitting, Cuff Size: Large)   Pulse 96   Ht  (1.575 m)   Wt 220 lb (99.8 kg)   LMP  (LMP Unknown)   BMI 40.24 kg/m  Body mass index is 40.24 kg/m.  Physical Examination: General appearance - alert, well appearing, and in no distress  No results found for this or any previous visit (from the past 24 hour(s)).   Assessment & Plan:  A:   Blighted ovum  CHTN  Rh+  P:  Rx cytotec pv x 1, repeat in 48hrs if needed  Rx vicodin 5/325mg  #5 w/ 0RF to take if needed  Discussed what to expect w/ miscarriage  CBC, BHCG today  Return in about 1 week (around 04/14/2016) for F/U.  Marge Duncans CNM, [facility] 04/07/2016 3:53 PM

## 2016-04-07 NOTE — Patient Instructions (Signed)

## 2016-04-08 LAB — CBC
Hematocrit: 37.8 % (ref 34.0–46.6)
Hemoglobin: 12.6 g/dL (ref 11.1–15.9)
MCH: 28.6 pg (ref 26.6–33.0)
MCHC: 33.3 g/dL (ref 31.5–35.7)
MCV: 86 fL (ref 79–97)
Platelets: 317 10*3/uL (ref 150–379)
RBC: 4.4 x10E6/uL (ref 3.77–5.28)
RDW: 14.3 % (ref 12.3–15.4)
WBC: 11 10*3/uL — ABNORMAL HIGH (ref 3.4–10.8)

## 2016-04-08 LAB — BETA HCG QUANT (REF LAB): hCG Quant: 25270 m[IU]/mL

## 2016-04-14 ENCOUNTER — Encounter: Payer: Self-pay | Admitting: Women's Health

## 2016-04-14 ENCOUNTER — Ambulatory Visit (INDEPENDENT_AMBULATORY_CARE_PROVIDER_SITE_OTHER): Payer: BLUE CROSS/BLUE SHIELD | Admitting: Women's Health

## 2016-04-14 VITALS — BP 132/78 | HR 105 | Wt 221.0 lb

## 2016-04-14 DIAGNOSIS — O039 Complete or unspecified spontaneous abortion without complication: Secondary | ICD-10-CM

## 2016-04-14 DIAGNOSIS — O020 Blighted ovum and nonhydatidiform mole: Secondary | ICD-10-CM

## 2016-04-14 NOTE — Patient Instructions (Signed)
If you decide for the Nexplanon, call and schedule No sex until it's put in  If you have had sex, we need to wait at least 14 days from last time you had sex and we will do urine pregnancy test right before we put it in OR 10 days from last time you had sex and we can do blood work in the morning to make sure you are not pregnant and put in Nexplanon in afternoon

## 2016-04-14 NOTE — Progress Notes (Signed)
   [facility] Visit  Patient name: Alexis Montgomery MRN [id number]  Date of PEARLIE NIES0-10-12  CC & HPI:  Alexis Montgomery is a 26 y.o. G62P0101 Caucasian female presenting today for f/u blighted ovum. Rx'd cytotec last Monday. States she took Wed around lunch while at work, and by [employer] she had started bleeding heavy through her clothes w/ bad cramps. Does feel she passed everything, cramps have resolved, bleeding is now just spotting. BHCG 81,131 3wks ago, last Monday down to 25,270. Requests work note for [employer] & Friday.  Unsure if she wants to try again right now for another pregnancy, wants to think about it some more. Husband does want another baby, pt is unsure right now. If she decides she doesn't, she wants nexplanon.  No LMP recorded (lmp unknown). Patient is pregnant. Last pap Jun 11, 2015, neg  Pertinent History Reviewed:  Medical & Surgical Hx:   Past medical, surgical, family, and social history reviewed in electronic medical record Medications: Reviewed & Updated - see associated section Allergies: Reviewed in electronic medical record  Objective Findings:  Vitals: BP 132/78 (BP Location: Left Arm, Patient Position: Sitting, Cuff Size: Normal)   Pulse (!) 105   Wt 221 lb (100.2 kg)   LMP  (LMP Unknown)   BMI 40.42 kg/m  Body mass index is 40.42 kg/m.  Physical Examination: General appearance - alert, well appearing, and in no distress  No results found for this or any previous visit (from the past 24 hour(s)).   Assessment & Plan:  A:   Completed SAB/Blighted ovum  Rh+  P:  Will repeat bhcg today   If decides doesn't want to try for another pregnancy, call to schedule nexplanon insertion.  Discussed timing- prefer no sex until placed, or 14d from last sex w/ UPT, or 10d from last sex w/ bhcg am/insertion pm  Otherwise Return for june for physical.   Work note given for 4/5 & 4/6 as well as this am  Marge Duncans CNM, [facility] 04/14/2016 9:01  AM

## 2016-04-15 LAB — BETA HCG QUANT (REF LAB): HCG QUANT: 8806 m[IU]/mL

## 2016-04-24 ENCOUNTER — Encounter: Payer: Self-pay | Admitting: Women's Health

## 2016-05-06 ENCOUNTER — Encounter: Payer: Self-pay | Admitting: Women's Health

## 2016-05-12 DIAGNOSIS — Z79899 Other long term (current) drug therapy: Secondary | ICD-10-CM | POA: Diagnosis not present

## 2016-06-13 ENCOUNTER — Encounter: Payer: Self-pay | Admitting: Women's Health

## 2016-06-16 ENCOUNTER — Telehealth: Payer: Self-pay | Admitting: *Deleted

## 2016-06-16 ENCOUNTER — Other Ambulatory Visit: Payer: BLUE CROSS/BLUE SHIELD

## 2016-06-16 DIAGNOSIS — O200 Threatened abortion: Secondary | ICD-10-CM

## 2016-06-16 NOTE — Telephone Encounter (Signed)
Spoke to Sprint Nextel CorporationKim and informed patient she would like her to have Hcg and progesterone checked. Patient will come after 1 pm today.

## 2016-06-16 NOTE — Telephone Encounter (Signed)
Patient called stating she is concerned she may be having a miscarriage. She is unsure of her last period, stating she is very irregular and bled for a while after her recent miscarriage in April. She is having bleeding a little heavier than spotting but not saturating a pad, no cramping or dizziness.

## 2016-06-17 ENCOUNTER — Encounter: Payer: Self-pay | Admitting: Women's Health

## 2016-06-17 ENCOUNTER — Other Ambulatory Visit: Payer: Self-pay | Admitting: Women's Health

## 2016-06-17 DIAGNOSIS — N939 Abnormal uterine and vaginal bleeding, unspecified: Secondary | ICD-10-CM

## 2016-06-17 DIAGNOSIS — Z3201 Encounter for pregnancy test, result positive: Secondary | ICD-10-CM

## 2016-06-17 LAB — BETA HCG QUANT (REF LAB): hCG Quant: 60 m[IU]/mL

## 2016-06-17 LAB — PROGESTERONE: Progesterone: 0.7 ng/mL

## 2016-06-18 ENCOUNTER — Other Ambulatory Visit: Payer: BLUE CROSS/BLUE SHIELD

## 2016-06-18 DIAGNOSIS — N939 Abnormal uterine and vaginal bleeding, unspecified: Secondary | ICD-10-CM | POA: Diagnosis not present

## 2016-06-18 DIAGNOSIS — O039 Complete or unspecified spontaneous abortion without complication: Secondary | ICD-10-CM

## 2016-06-18 DIAGNOSIS — Z3201 Encounter for pregnancy test, result positive: Secondary | ICD-10-CM | POA: Diagnosis not present

## 2016-06-19 LAB — BETA HCG QUANT (REF LAB): hCG Quant: 61 m[IU]/mL

## 2016-06-20 ENCOUNTER — Encounter: Payer: Self-pay | Admitting: Women's Health

## 2016-06-20 ENCOUNTER — Telehealth: Payer: Self-pay | Admitting: Women's Health

## 2016-06-20 DIAGNOSIS — O039 Complete or unspecified spontaneous abortion without complication: Secondary | ICD-10-CM

## 2016-06-20 NOTE — Telephone Encounter (Signed)
Called pt, notified of BHCG 60 (6/11) w/ progesterone 0.07, then repeat BHCG 61 days later on 6/13. Discussed all recent pregnancy u/s and bhcg as well as these recent bhcg w/ Dr. Despina HiddenEure- states definitely different pregnancy, and unfortunately is another failed pregnancy. Nothing further to do at this point. Pt having light spotting at this time, no cramping. Per discussion w/ patient, will get bhcg next Wed 6/20. I will call her w/ results.  Cheral MarkerKimberly R. Wenceslaus Gist, CNM, WHNP-BC 06/20/2016 11:04 AM

## 2016-06-25 DIAGNOSIS — O039 Complete or unspecified spontaneous abortion without complication: Secondary | ICD-10-CM | POA: Diagnosis not present

## 2016-06-26 ENCOUNTER — Other Ambulatory Visit: Payer: Self-pay | Admitting: Women's Health

## 2016-06-26 ENCOUNTER — Telehealth: Payer: Self-pay | Admitting: *Deleted

## 2016-06-26 ENCOUNTER — Encounter: Payer: Self-pay | Admitting: Women's Health

## 2016-06-26 DIAGNOSIS — O039 Complete or unspecified spontaneous abortion without complication: Secondary | ICD-10-CM

## 2016-06-26 LAB — BETA HCG QUANT (REF LAB): hCG Quant: 49 m[IU]/mL

## 2016-06-26 NOTE — Telephone Encounter (Signed)
Left message x 1. JSY 

## 2016-06-26 NOTE — Telephone Encounter (Signed)
Spoke with pt letting her know Sharene Buttersquant is dropping and will need to continue having weekly checks until number is less than 5. Next lab should be drawn 6/27. Pt aware and voiced understanding. JSY

## 2016-07-02 ENCOUNTER — Other Ambulatory Visit: Payer: BLUE CROSS/BLUE SHIELD

## 2016-07-02 DIAGNOSIS — O039 Complete or unspecified spontaneous abortion without complication: Secondary | ICD-10-CM | POA: Diagnosis not present

## 2016-07-03 ENCOUNTER — Telehealth: Payer: Self-pay | Admitting: *Deleted

## 2016-07-03 ENCOUNTER — Telehealth: Payer: Self-pay | Admitting: Women's Health

## 2016-07-03 DIAGNOSIS — O0289 Other abnormal products of conception: Secondary | ICD-10-CM

## 2016-07-03 DIAGNOSIS — O039 Complete or unspecified spontaneous abortion without complication: Secondary | ICD-10-CM

## 2016-07-03 LAB — BETA HCG QUANT (REF LAB): HCG QUANT: 53 m[IU]/mL

## 2016-07-03 NOTE — Telephone Encounter (Signed)
Spoke w/ pt, notified her of yesterday's hcg 53, up from 49 one wk ago-where it had been dropping-and spoke w/ Dr. Despina HiddenEure- still nonviable pregnancy, possible chronic ectopic. Pt denies any pain/bleeding/sx. Advised if pain/heavy bleeding, dizziness/lightheadedness, etc to go straight to ED. Per Dr. Forestine ChuteEure's recommendation, to come next Tues 7/3 for bhcg (lab closed on wed 7/4) and Thurs for u/s and f/u with him. Appt Thurs @ 1030 for u/s and 1100 w/ him to discuss u/s.  Cheral MarkerKimberly R. Longino Trefz, CNM, [facility] 07/03/2016 12:57 PM

## 2016-07-03 NOTE — Telephone Encounter (Signed)
LM for pt to return call. Sharene ButtersQuant has risen slightly to 53 from 49 last week. Discussed w/ [facility], still non-viable pregnancy, possible chronic ectopic. He wants pt to repeat BHCG in 1wk w/ u/s the next day and f/u w/ him.  Cheral MarkerKimberly R. Cabe Lashley, CNM, [facility] 07/03/2016 9:50 AM

## 2016-07-05 ENCOUNTER — Encounter (HOSPITAL_COMMUNITY): Payer: Self-pay | Admitting: *Deleted

## 2016-07-05 ENCOUNTER — Emergency Department (HOSPITAL_COMMUNITY)
Admission: EM | Admit: 2016-07-05 | Discharge: 2016-07-05 | Disposition: A | Discharge status: Home / Self Care | Payer: BLUE CROSS/BLUE SHIELD | Attending: Emergency Medicine | Admitting: Emergency Medicine

## 2016-07-05 ENCOUNTER — Inpatient Hospital Stay (EMERGENCY_DEPARTMENT_HOSPITAL)
Admission: AD | Admit: 2016-07-05 | Discharge: 2016-07-05 | Disposition: A | Discharge status: Home / Self Care | Payer: BLUE CROSS/BLUE SHIELD | Source: Ambulatory Visit | Attending: Family Medicine | Admitting: Family Medicine

## 2016-07-05 ENCOUNTER — Emergency Department (HOSPITAL_COMMUNITY): Payer: BLUE CROSS/BLUE SHIELD

## 2016-07-05 ENCOUNTER — Encounter (HOSPITAL_COMMUNITY): Payer: Self-pay

## 2016-07-05 DIAGNOSIS — O26891 Other specified pregnancy related conditions, first trimester: Secondary | ICD-10-CM | POA: Insufficient documentation

## 2016-07-05 DIAGNOSIS — O209 Hemorrhage in early pregnancy, unspecified: Secondary | ICD-10-CM | POA: Diagnosis not present

## 2016-07-05 DIAGNOSIS — O26899 Other specified pregnancy related conditions, unspecified trimester: Secondary | ICD-10-CM

## 2016-07-05 DIAGNOSIS — O009 Unspecified ectopic pregnancy without intrauterine pregnancy: Secondary | ICD-10-CM

## 2016-07-05 DIAGNOSIS — O034 Incomplete spontaneous abortion without complication: Secondary | ICD-10-CM

## 2016-07-05 DIAGNOSIS — O4690 Antepartum hemorrhage, unspecified, unspecified trimester: Secondary | ICD-10-CM

## 2016-07-05 DIAGNOSIS — Z3A01 Less than 8 weeks gestation of pregnancy: Secondary | ICD-10-CM | POA: Diagnosis not present

## 2016-07-05 DIAGNOSIS — Z3A00 Weeks of gestation of pregnancy not specified: Secondary | ICD-10-CM | POA: Diagnosis not present

## 2016-07-05 DIAGNOSIS — R1110 Vomiting, unspecified: Secondary | ICD-10-CM | POA: Diagnosis not present

## 2016-07-05 DIAGNOSIS — Z3490 Encounter for supervision of normal pregnancy, unspecified, unspecified trimester: Secondary | ICD-10-CM

## 2016-07-05 DIAGNOSIS — R1032 Left lower quadrant pain: Secondary | ICD-10-CM | POA: Diagnosis not present

## 2016-07-05 DIAGNOSIS — I10 Essential (primary) hypertension: Secondary | ICD-10-CM | POA: Insufficient documentation

## 2016-07-05 DIAGNOSIS — R102 Pelvic and perineal pain: Secondary | ICD-10-CM

## 2016-07-05 LAB — COMPREHENSIVE METABOLIC PANEL
ALK PHOS: 42 U/L (ref 38–126)
ALT: 17 U/L (ref 14–54)
AST: 17 U/L (ref 15–41)
Albumin: 3.3 g/dL — ABNORMAL LOW (ref 3.5–5.0)
Anion gap: 10 (ref 5–15)
BILIRUBIN TOTAL: 0.2 mg/dL — AB (ref 0.3–1.2)
BUN: 21 mg/dL — AB (ref 6–20)
CALCIUM: 8.7 mg/dL — AB (ref 8.9–10.3)
CHLORIDE: 104 mmol/L (ref 101–111)
CO2: 23 mmol/L (ref 22–32)
CREATININE: 0.74 mg/dL (ref 0.44–1.00)
GFR calc Af Amer: >60 mL/min (ref >60)
Glucose, Bld: 139 mg/dL — ABNORMAL HIGH (ref 65–99)
Potassium: 3.8 mmol/L (ref 3.5–5.1)
Sodium: 137 mmol/L (ref 135–145)
TOTAL PROTEIN: 6.4 g/dL — AB (ref 6.5–8.1)

## 2016-07-05 LAB — CBC WITH DIFFERENTIAL/PLATELET
BASOS ABS: 0.1 10*3/uL (ref 0.0–0.1)
Basophils Relative: 1 %
Eosinophils Absolute: 0.9 10*3/uL — ABNORMAL HIGH (ref 0.0–0.7)
Eosinophils Relative: 9 %
HEMATOCRIT: 32 % — AB (ref 36.0–46.0)
Hemoglobin: 10.7 g/dL — ABNORMAL LOW (ref 12.0–15.0)
LYMPHS PCT: 37 %
Lymphs Abs: 3.9 10*3/uL (ref 0.7–4.0)
MCH: 27.3 pg (ref 26.0–34.0)
MCHC: 33.4 g/dL (ref 30.0–36.0)
MCV: 81.6 fL (ref 78.0–100.0)
MONO ABS: 0.3 10*3/uL (ref 0.1–1.0)
MONOS PCT: 3 %
NEUTROS ABS: 5.2 10*3/uL (ref 1.7–7.7)
Neutrophils Relative %: 50 %
Platelets: 256 10*3/uL (ref 150–400)
RBC: 3.92 MIL/uL (ref 3.87–5.11)
RDW: 13.5 % (ref 11.5–15.5)
WBC: 10.4 10*3/uL (ref 4.0–10.5)

## 2016-07-05 LAB — BASIC METABOLIC PANEL
Anion gap: 9 (ref 5–15)
BUN: 20 mg/dL (ref 6–20)
CALCIUM: 8.9 mg/dL (ref 8.9–10.3)
CO2: 24 mmol/L (ref 22–32)
Chloride: 107 mmol/L (ref 101–111)
Creatinine, Ser: 0.74 mg/dL (ref 0.44–1.00)
GFR calc Af Amer: >60 mL/min (ref >60)
GFR calc non Af Amer: >60 mL/min (ref >60)
GLUCOSE: 151 mg/dL — AB (ref 65–99)
Potassium: 3.8 mmol/L (ref 3.5–5.1)
Sodium: 140 mmol/L (ref 135–145)

## 2016-07-05 LAB — HCG, QUANTITATIVE, PREGNANCY: hCG, Beta Chain, Quant, S: 40 m[IU]/mL — ABNORMAL HIGH (ref <5)

## 2016-07-05 MED ORDER — METHOTREXATE INJECTION FOR WOMEN'S HOSPITAL
50.0000 mg/m2 | Freq: Once | INTRAMUSCULAR | Status: AC
  Administered 2016-07-05: 105 mg via INTRAMUSCULAR
  Filled 2016-07-05: qty 2.1

## 2016-07-05 MED ORDER — SODIUM CHLORIDE 0.9 % IV BOLUS (SEPSIS)
500.0000 mL | Freq: Once | INTRAVENOUS | Status: AC
  Administered 2016-07-05: 500 mL via INTRAVENOUS

## 2016-07-05 MED ORDER — OXYCODONE-ACETAMINOPHEN 5-325 MG PO TABS: 1.0000 | ORAL_TABLET | ORAL | 0 refills | Status: DC | PRN

## 2016-07-05 MED ORDER — PROMETHAZINE HCL 25 MG PO TABS: 12.5000 mg | ORAL_TABLET | Freq: Four times a day (QID) | ORAL | 0 refills | Status: DC | PRN

## 2016-07-05 MED ORDER — SODIUM CHLORIDE 0.9 % IV BOLUS (SEPSIS)
1000.0000 mL | Freq: Once | INTRAVENOUS | Status: AC
  Administered 2016-07-05: 1000 mL via INTRAVENOUS

## 2016-07-05 MED ORDER — ONDANSETRON HCL 4 MG/2ML IJ SOLN
4.0000 mg | Freq: Once | INTRAMUSCULAR | Status: AC
  Administered 2016-07-05: 4 mg via INTRAVENOUS
  Filled 2016-07-05: qty 2

## 2016-07-05 MED ORDER — MISOPROSTOL 100 MCG PO TABS
600.0000 ug | ORAL_TABLET | Freq: Once | ORAL | Status: AC
  Administered 2016-07-05: 600 ug via ORAL
  Filled 2016-07-05: qty 6

## 2016-07-05 NOTE — MAU Provider Note (Signed)
Chief Complaint: Threatened Miscarriage   None     SUBJECTIVE HPI: Alexis Montgomery is a 26 y.o. [phone] at Unknown by LMP who presents to maternity admissions sent from [facility] by Dr Emelda Fear for possible ectopic pregnancy to be treated with methothrexate. She has been seen recently at [facility] and followed with quant hcgs because of light bleeding but no pain. Pt has inappropriate rise/plateau of quant hcg. See below:  6/11: Hcg   60 6/13: Hcg   61 6/20: Hcg   49 6/27: Hcg   53  She presented to [facility] today with heavier bleeding and abdominal pain and had quant hcg of 40. US showed no IUP or ectopic pregnancy. Dr Emelda Fear was consulted and pt was treated at [facility] with Cytotec and sent to MAU for methotrexate for presumed/possible ectopic also.   She denies vaginal itching/burning, urinary symptoms, h/a, dizziness, n/v, or fever/chills.     HPI  Past Medical History:  Diagnosis Date  . Anemia   . Benign essential HTN 10/09/2014  . Contraceptive management 06/10/2012   nexplanon inserted left arm 06/10/12  . Encounter for Nexplanon removal 07/09/2015  . Hidradenitis suppurativa 10/09/2014  . Menorrhagia with irregular cycle 06/11/2015  . Morbid obesity (HCC) 10/09/2014  . MRSA infection 10/09/2014  . Nexplanon in place 06/11/2015  . Nexplanon insertion 07/09/2015  . Pregnancy induced hypertension   . Recurrent boils   . Screen for STD (sexually transmitted disease) 10/09/2014   Past Surgical History:  Procedure Laterality Date  . CESAREAN SECTION N/A 04/23/2012   Procedure: CESAREAN SECTION;  Surgeon: Tereso Newcomer, MD;  Location: [facility];  Service: Obstetrics;  Laterality: N/A;   Social History   Social History  . Marital status: Married    Spouse name: N/A  . Number of children: N/A  . Years of education: N/A   Occupational History  . Not on file.   Social History Main Topics  . Smoking status: Never Smoker  . Smokeless tobacco: Never Used  . Alcohol use No  .  Drug use: No  . Sexual activity: Yes    Birth control/ protection: None   Other Topics Concern  . Not on file   Social History Narrative  . No narrative on file   No current facility-administered medications on file prior to encounter.    Current Outpatient Prescriptions on File Prior to Encounter  Medication Sig Dispense Refill  . HUMIRA PEN-CROHNS STARTER 40 MG/0.8ML PNKT   0  . labetalol (NORMODYNE) 100 MG tablet Take 1 tablet (100 mg total) by mouth 2 (two) times daily. 60 tablet 6   No Known Allergies  ROS:  Review of Systems  Constitutional: Negative for chills, fatigue and fever.  Respiratory: Negative for shortness of breath.   Cardiovascular: Negative for chest pain.  Gastrointestinal: Positive for abdominal pain.  Genitourinary: Positive for pelvic pain and vaginal bleeding. Negative for difficulty urinating, dysuria, flank pain, vaginal discharge and vaginal pain.  Neurological: Negative for dizziness and headaches.  Psychiatric/Behavioral: Negative.      I have reviewed patient's Past Medical Hx, Surgical Hx, Family Hx, Social Hx, medications and allergies.   Physical Exam   Patient Vitals for the past 24 hrs:  BP Pulse Resp Height Weight  07/05/16 1204 - - - 5\' 3"  (1.6 m) 216 lb 1.3 oz (98 kg)  07/05/16 1201 (P) 134/82 (P) 82 (P) 18 - -   Constitutional: Well-developed, well-nourished female in no acute distress.  Cardiovascular:  normal rate Respiratory: normal effort GI: Abd soft, non-tender. Pos BS x 4 MS: Extremities nontender, no edema, normal ROM Neurologic: Alert and oriented x 4.  GU: Neg CVAT.  PELVIC EXAM: Deferred  LAB RESULTS Results for orders placed or performed during the hospital encounter of 07/05/16 (from the past 24 hour(s))  CBC with Differential     Status: Abnormal   Collection Time: 07/05/16  2:46 AM  Result Value Ref Range   WBC 10.4 4.0 - 10.5 K/uL   RBC 3.92 3.87 - 5.11 MIL/uL   Hemoglobin 10.7 (L) 12.0 - 15.0 g/dL   HCT  16.1 (L) 09.6 - 46.0 %   MCV 81.6 78.0 - 100.0 fL   MCH 27.3 26.0 - 34.0 pg   MCHC 33.4 30.0 - 36.0 g/dL   RDW 04.5 40.9 - 81.1 %   Platelets 256 150 - 400 K/uL   Neutrophils Relative % 50 %   Neutro Abs 5.2 1.7 - 7.7 K/uL   Lymphocytes Relative 37 %   Lymphs Abs 3.9 0.7 - 4.0 K/uL   Monocytes Relative 3 %   Monocytes Absolute 0.3 0.1 - 1.0 K/uL   Eosinophils Relative 9 %   Eosinophils Absolute 0.9 (H) 0.0 - 0.7 K/uL   Basophils Relative 1 %   Basophils Absolute 0.1 0.0 - 0.1 K/uL  Basic metabolic panel     Status: Abnormal   Collection Time: 07/05/16  2:46 AM  Result Value Ref Range   Sodium 140 135 - 145 mmol/L   Potassium 3.8 3.5 - 5.1 mmol/L   Chloride 107 101 - 111 mmol/L   CO2 24 22 - 32 mmol/L   Glucose, Bld 151 (H) 65 - 99 mg/dL   BUN 20 6 - 20 mg/dL   Creatinine, Ser 9.14 0.44 - 1.00 mg/dL   Calcium 8.9 8.9 - 78.2 mg/dL   GFR calc non Af Amer >60 >60 mL/min   GFR calc Af Amer >60 >60 mL/min   Anion gap 9 5 - 15  hCG, quantitative, pregnancy     Status: Abnormal   Collection Time: 07/05/16  2:46 AM  Result Value Ref Range   hCG, Beta Chain, Quant, S 40 (H) <5 mIU/mL  Comprehensive metabolic panel     Status: Abnormal   Collection Time: 07/05/16  2:55 AM  Result Value Ref Range   Sodium 137 135 - 145 mmol/L   Potassium 3.8 3.5 - 5.1 mmol/L   Chloride 104 101 - 111 mmol/L   CO2 23 22 - 32 mmol/L   Glucose, Bld 139 (H) 65 - 99 mg/dL   BUN 21 (H) 6 - 20 mg/dL   Creatinine, Ser 9.56 0.44 - 1.00 mg/dL   Calcium 8.7 (L) 8.9 - 10.3 mg/dL   Total Protein 6.4 (L) 6.5 - 8.1 g/dL   Albumin 3.3 (L) 3.5 - 5.0 g/dL   AST 17 15 - 41 U/L   ALT 17 14 - 54 U/L   Alkaline Phosphatase 42 38 - 126 U/L   Total Bilirubin 0.2 (L) 0.3 - 1.2 mg/dL   GFR calc non Af Amer >60 >60 mL/min   GFR calc Af Amer >60 >60 mL/min   Anion gap 10 5 - 15       IMAGING US Ob Comp Less 14 Wks  Result Date: 07/05/2016 CLINICAL DATA:  Left lower quadrant pain. Vaginal bleeding. Unknown LMP.  EXAM: OBSTETRIC <14 WK [place] AND TRANSVAGINAL OB US TECHNIQUE: Both transabdominal and transvaginal ultrasound examinations were performed  for complete evaluation of the gestation as well as the maternal uterus, adnexal regions, and pelvic cul-de-sac. Transvaginal technique was performed to assess early pregnancy. COMPARISON:  None. FINDINGS: Intrauterine gestational sac: None Maternal uterus/adnexae: The endometrium shows heterogeneous thickening measuring approximately 19 mm. There is a small amount fluid seen within the endometrial cavity. No fibroids identified. Neither ovary is directly visualized on this exam, however no adnexal mass identified. Trace amount of simple free fluid noted in pelvic cul-de-sac. IMPRESSION: No intrauterine gestational sac or adnexal mass identified, consistent with pregnancy of unknown anatomic location. Differential diagnosis includes recent spontaneous abortion, IUP too early to visualize, and non-visualized ectopic pregnancy. Recommend follow up of quantitative B-HCG levels, and follow up US as clinically warranted. Electronically Signed   By: Myles RosenthalJohn  Stahl M.D.   On: 07/05/2016 07:23   [place] Ob Transvaginal  Result Date: 07/05/2016 CLINICAL DATA:  Left lower quadrant pain. Vaginal bleeding. Unknown LMP. EXAM: OBSTETRIC <14 WK US AND TRANSVAGINAL OB US TECHNIQUE: Both transabdominal and transvaginal ultrasound examinations were performed for complete evaluation of the gestation as well as the maternal uterus, adnexal regions, and pelvic cul-de-sac. Transvaginal technique was performed to assess early pregnancy. COMPARISON:  None. FINDINGS: Intrauterine gestational sac: None Maternal uterus/adnexae: The endometrium shows heterogeneous thickening measuring approximately 19 mm. There is a small amount fluid seen within the endometrial cavity. No fibroids identified. Neither ovary is directly visualized on this exam, however no adnexal mass identified. Trace amount of simple free fluid  noted in pelvic cul-de-sac. IMPRESSION: No intrauterine gestational sac or adnexal mass identified, consistent with pregnancy of unknown anatomic location. Differential diagnosis includes recent spontaneous abortion, IUP too early to visualize, and non-visualized ectopic pregnancy. Recommend follow up of quantitative B-HCG levels, and follow up US as clinically warranted. Electronically Signed   By: Myles RosenthalJohn  Stahl M.D.   On: 07/05/2016 07:23    MAU Management/MDM: [facility]  Consult Dr Shawnie PonsPratt with assessment, findings, and previous evaluation.  Give methotrexate today because cannot rule out ectopic with plateau in hcg.  Discussed with pt, [facility]  MTX given by RN, pt tolerated well.  Rx for Percocet and Phenergan PRN.  F/U on day 4 and day 7.  Pt to see Emelda FearFerguson on Monday (Day 3) so will ask [facility]  Pt aware that she needs to follow up and which days are needed.  Return to MAU as needed for emergencies. Pt stable at time of discharge.  ASSESSMENT 1. Ectopic pregnancy without intrauterine pregnancy, unspecified location     PLAN Discharge home Allergies as of 07/05/2016   No Known Allergies     Medication List    STOP taking these medications   HYDROcodone-acetaminophen 5-325 MG tablet Commonly known as:  NORCO/VICODIN   LISINOPRIL PO   misoprostol 200 MCG tablet Commonly known as:  CYTOTEC     TAKE these medications   HUMIRA PEN-CROHNS STARTER 40 MG/0.8ML Pnkt Generic drug:  Adalimumab   labetalol 100 MG tablet Commonly known as:  NORMODYNE Take 1 tablet (100 mg total) by mouth 2 (two) times daily.   oxyCODONE-acetaminophen 5-325 MG tablet Commonly known as:  PERCOCET/ROXICET Take 1 tablet by mouth every 4 (four) hours as needed for severe pain.   promethazine 25 MG tablet Commonly known as:  PHENERGAN Take 0.5-1 tablets (12.5-25 mg total) by  mouth every 6 (six) hours as needed.  What changed:  reasons to take this      Follow-up Information    [facility] Follow up.   Specialty:  Obstetrics and Gynecology Why:  On Monday 7/2 as scheduled, you will need follow up labs on Tuesday 7/3 and Friday 7/6 either at [facility] or [facility]. Please confirm with [facility] for best location. Return to MAU as needed for emergencies. Contact information: [address] [place] [address] [phone]          Sharen Counter Certified Nurse-Midwife 07/05/2016  7:28 PM

## 2016-07-05 NOTE — ED Provider Notes (Signed)
OB/GYN Dr. Emelda FearFerguson has evaluated pt in the ED:  States pt can be d/c to [facility] (methotrexate injection). Pt and family agreeable with plan. Will d/c stable.    Samuel JesterMcManus, Sylena Lotter, DO 07/05/16 1005

## 2016-07-05 NOTE — Consult Note (Signed)
Reason for Consult:pregnancy of unknown location, nonviable pregnancy Referring Physician: Tomi Bamberger, ED MD  Alexis Montgomery is an 26 y.o. female who has been being followed at [facility] for a non-viable pregnancy based on Qhcg levels that are staying in 40-60 range. Pt had a recent miscarriage, had home pregnancy tests that reverted to negative, resumed sex, and has + hcgs as documented in labs. Presented to ED [facility] with pain pelvic, L>R, with u/s that shows no adnexal pathology and shows fluid and some tissue in uterus. Discussed treatment options, including endometrial sampling , which pt declines, versus D&C. Given uncertainty of location, I feel Methotrexate to cover risk of Chronic Ectopic warranted.  Pertinent Gynecological History: Menses:  Bleeding:  Contraception: none DES exposure: unknown Blood transfusions: none Sexually transmitted diseases: no past history Previous GYN Procedures:   Last mammogram: Date:  Last pap:  Date:  OB History: G, P   Menstrual History: Menarche age:  No LMP recorded.    Past Medical History:  Diagnosis Date  . Anemia   . Benign essential HTN 10/09/2014  . Contraceptive management 06/10/2012   nexplanon inserted left arm 06/10/12  . Encounter for Nexplanon removal 07/09/2015  . Hidradenitis suppurativa 10/09/2014  . Menorrhagia with irregular cycle 06/11/2015  . Morbid obesity ([place]) 10/09/2014  . MRSA infection 10/09/2014  . Nexplanon in place 06/11/2015  . Nexplanon insertion 07/09/2015  . Pregnancy induced hypertension   . Recurrent boils   . Screen for STD (sexually transmitted disease) 10/09/2014    Past Surgical History:  Procedure Laterality Date  . CESAREAN SECTION N/A 04/23/2012   Procedure: CESAREAN SECTION;  Surgeon: Osborne Oman, MD;  Location: [facility];  Service: Obstetrics;  Laterality: N/A;    Family History  Problem Relation Age of Onset  . HIV Mother   . Cancer Mother   . Heart Problems Paternal Grandfather   . Diabetes Paternal  Uncle   . Other Sister        hidradenitis suppurativa    Social History:  reports that she has never smoked. She has never used smokeless tobacco. She reports that she does not drink alcohol or use drugs.  Allergies: No Known Allergies  Medications: I have reviewed the patient's current medications.  ROS  Blood pressure 104/65, pulse 84, temperature 98.6 F (37 C), temperature source Oral, resp. rate 16, height 5' 3"  (1.6 m), weight 97.5 kg (215 lb), SpO2 97 %. Physical Exam  Results for orders placed or performed during the hospital encounter of 07/05/16 (from the past 48 hour(s))  CBC with Differential     Status: Abnormal   Collection Time: 07/05/16  2:46 AM  Result Value Ref Range   WBC 10.4 4.0 - 10.5 K/uL   RBC 3.92 3.87 - 5.11 MIL/uL   Hemoglobin 10.7 (L) 12.0 - 15.0 g/dL   HCT 32.0 (L) 36.0 - 46.0 %   MCV 81.6 78.0 - 100.0 fL   MCH 27.3 26.0 - 34.0 pg   MCHC 33.4 30.0 - 36.0 g/dL   RDW 13.5 11.5 - 15.5 %   Platelets 256 150 - 400 K/uL   Neutrophils Relative % 50 %   Neutro Abs 5.2 1.7 - 7.7 K/uL   Lymphocytes Relative 37 %   Lymphs Abs 3.9 0.7 - 4.0 K/uL   Monocytes Relative 3 %   Monocytes Absolute 0.3 0.1 - 1.0 K/uL   Eosinophils Relative 9 %   Eosinophils Absolute 0.9 (H) 0.0 - 0.7 K/uL   Basophils  Relative 1 %   Basophils Absolute 0.1 0.0 - 0.1 K/uL  Basic metabolic panel     Status: Abnormal   Collection Time: 07/05/16  2:46 AM  Result Value Ref Range   Sodium 140 135 - 145 mmol/L   Potassium 3.8 3.5 - 5.1 mmol/L   Chloride 107 101 - 111 mmol/L   CO2 24 22 - 32 mmol/L   Glucose, Bld 151 (H) 65 - 99 mg/dL   BUN 20 6 - 20 mg/dL   Creatinine, Ser 0.74 0.44 - 1.00 mg/dL   Calcium 8.9 8.9 - 10.3 mg/dL   GFR calc non Af Amer >60 >60 mL/min   GFR calc Af Amer >60 >60 mL/min    Comment: (NOTE) The eGFR has been calculated using the CKD EPI equation. This calculation has not been validated in all clinical situations. eGFR's persistently <60 mL/min signify  possible Chronic Kidney Disease.    Anion gap 9 5 - 15  hCG, quantitative, pregnancy     Status: Abnormal   Collection Time: 07/05/16  2:46 AM  Result Value Ref Range   hCG, Beta Chain, Quant, S 40 (H) <5 mIU/mL    Comment:          GEST. AGE      CONC.  (mIU/mL)   <=1 WEEK        5 - 50     2 WEEKS       50 - 500     3 WEEKS       100 - 10,000     4 WEEKS     1,000 - 30,000     5 WEEKS     3,500 - 115,000   6-8 WEEKS     12,000 - 270,000    12 WEEKS     15,000 - 220,000        FEMALE AND NON-PREGNANT FEMALE:     LESS THAN 5 mIU/mL     US Ob Comp Less 14 Wks  Result Date: 07/05/2016 CLINICAL DATA:  Left lower quadrant pain. Vaginal bleeding. Unknown LMP. EXAM: OBSTETRIC <14 WK [place] AND TRANSVAGINAL OB US TECHNIQUE: Both transabdominal and transvaginal ultrasound examinations were performed for complete evaluation of the gestation as well as the maternal uterus, adnexal regions, and pelvic cul-de-sac. Transvaginal technique was performed to assess early pregnancy. COMPARISON:  None. FINDINGS: Intrauterine gestational sac: None Maternal uterus/adnexae: The endometrium shows heterogeneous thickening measuring approximately 19 mm. There is a small amount fluid seen within the endometrial cavity. No fibroids identified. Neither ovary is directly visualized on this exam, however no adnexal mass identified. Trace amount of simple free fluid noted in pelvic cul-de-sac. IMPRESSION: No intrauterine gestational sac or adnexal mass identified, consistent with pregnancy of unknown anatomic location. Differential diagnosis includes recent spontaneous abortion, IUP too early to visualize, and non-visualized ectopic pregnancy. Recommend follow up of quantitative B-HCG levels, and follow up US as clinically warranted. Electronically Signed   By: Earle Gell M.D.   On: 07/05/2016 07:23   US Ob Transvaginal  Result Date: 07/05/2016 CLINICAL DATA:  Left lower quadrant pain. Vaginal bleeding. Unknown LMP. EXAM:  OBSTETRIC <14 WK [place] AND TRANSVAGINAL OB US TECHNIQUE: Both transabdominal and transvaginal ultrasound examinations were performed for complete evaluation of the gestation as well as the maternal uterus, adnexal regions, and pelvic cul-de-sac. Transvaginal technique was performed to assess early pregnancy. COMPARISON:  None. FINDINGS: Intrauterine gestational sac: None Maternal uterus/adnexae: The endometrium shows heterogeneous thickening measuring approximately 19 mm.  There is a small amount fluid seen within the endometrial cavity. No fibroids identified. Neither ovary is directly visualized on this exam, however no adnexal mass identified. Trace amount of simple free fluid noted in pelvic cul-de-sac. IMPRESSION: No intrauterine gestational sac or adnexal mass identified, consistent with pregnancy of unknown anatomic location. Differential diagnosis includes recent spontaneous abortion, IUP too early to visualize, and non-visualized ectopic pregnancy. Recommend follow up of quantitative B-HCG levels, and follow up US as clinically warranted. Electronically Signed   By: Earle Gell M.D.   On: 07/05/2016 07:23    Assessment/Plan: Pregnancy unknown location , non viability confirmed by Q hcg levels that are NOT increasing . Plan: Cytotec 600 mg po           Refer for Methotrexate tx, which cannot be done here at [facility] CBC done, Cmet pending Discussed with Alexis Montgomery CNM in MAU at [facility] today.  Alexis Montgomery 07/05/2016

## 2016-07-05 NOTE — MAU Note (Signed)
Patient sent by Dr. Emelda [facility]Ferguson for methotrexate, was given cytotec during the night at [facility].

## 2016-07-05 NOTE — ED Provider Notes (Signed)
[facility]Y DEPT Provider Note   CSN: [id number] Arrival date & time: 07/05/16  0227  Time seen 04:00 AM   History   Chief Complaint Chief Complaint  Patient presents with  . Emesis    HPI Alexis Montgomery is a 26 y.o. female.  HPI  patient is G3 P1 Ab1, and states she had a miscarriage 2 months ago however she found out she was pregnant again 3 weeks ago. She states [facility] her beta hCGs were dropping and then started to rise. She states they weren't sure whether she was pregnant or having an ectopic pregnancy. She states she had some heavy spotting a few weeks ago but tonight at 8 PM she started bleeding that she states is "heavier than a real period". She also complains of some bilateral lower abdominal pain. She states she has had nausea and vomited twice about 30 minutes prior to arrival or at 1 AM. She states she's had diarrhea 2-3 times. She states she feels dizzy even when she's lying down. She denies any fever.  Patient is A+  PCP Practice, [facility].   Past Medical History:  Diagnosis Date  . Anemia   . Benign essential HTN 10/09/2014  . Contraceptive management 06/10/2012   nexplanon inserted left arm 06/10/12  . Encounter for Nexplanon removal 07/09/2015  . Hidradenitis suppurativa 10/09/2014  . Menorrhagia with irregular cycle 06/11/2015  . Morbid obesity (HCC) 10/09/2014  . MRSA infection 10/09/2014  . Nexplanon in place 06/11/2015  . Nexplanon insertion 07/09/2015  . Pregnancy induced hypertension   . Recurrent boils   . Screen for STD (sexually transmitted disease) 10/09/2014    Patient Active Problem List   Diagnosis Date Noted  . Blighted ovum 03/31/2016  . H/O severe pre-e 03/03/2016  . Previous cesarean section 03/03/2016  . History of preterm delivery 03/03/2016  . Menorrhagia with irregular cycle 06/11/2015  . Hidradenitis suppurativa 10/09/2014  . MRSA infection 10/09/2014  . Benign essential HTN  10/09/2014  . Morbid obesity (HCC) 10/09/2014  . Sepsis (HCC) 06/15/2014  . Abscess of abdominal wall 06/15/2014    Past Surgical History:  Procedure Laterality Date  . CESAREAN SECTION N/A 04/23/2012   Procedure: CESAREAN SECTION;  Surgeon: Tereso Newcomer, MD;  Location: [facility];  Service: Obstetrics;  Laterality: N/A;    OB History    Gravida Para Term Preterm AB Living   3 1 0 1 2 1    SAB TAB Ectopic Multiple Live Births   2 0 0 0 1       Home Medications    Prior to Admission medications   Medication Sig Start Date End Date Taking? Authorizing Provider  HYDROcodone-acetaminophen (NORCO/VICODIN) 5-325 MG tablet Take 1-2 tablets by mouth every 6 (six) hours as needed for moderate pain or severe pain. Patient not taking: Reported on 04/14/2016 04/07/16   Cheral Marker, CNM  labetalol (NORMODYNE) 100 MG tablet Take 1 tablet (100 mg total) by mouth 2 (two) times daily. 03/03/16   Cheral Marker, CNM  LISINOPRIL PO Take by mouth daily.    [provider]  misoprostol (CYTOTEC) 200 MCG tablet Place 4 tablets (800 mcg total) vaginally once. Repeat in 48 hours if needed 04/07/16 04/07/16  Cheral Marker, CNM  promethazine (PHENERGAN) 25 MG tablet Take 0.5-1 tablets (12.5-25 mg total) by mouth every 6 (six) hours as needed for nausea or vomiting. Patient not taking: Reported on 04/07/2016 03/17/16  Cheral MarkerBooker, Kimberly R, CNM    Family History Family History  Problem Relation Age of Onset  . HIV Mother   . Cancer Mother   . Heart Problems Paternal Grandfather   . Diabetes Paternal Uncle   . Other Sister        hidradenitis suppurativa    Social History Social History  Substance Use Topics  . Smoking status: Never Smoker  . Smokeless tobacco: Never Used  . Alcohol use No     Allergies   Patient has no known allergies.   Review of Systems Review of Systems  All other systems reviewed and are negative.    Physical Exam Updated Vital Signs BP 101/62 (BP  Location: Left Arm)   Pulse 67   Temp 98.6 F (37 C) (Oral)   Resp 16   Ht 5\' 3"  (1.6 m)   Wt 97.5 kg (215 lb)   SpO2 97%   BMI 38.09 kg/m   Vital signs normal   Physical Exam  Constitutional: She is oriented to person, place, and time. She appears well-developed and well-nourished.  Non-toxic appearance. She does not appear ill. She appears distressed.  HENT:  Head: Normocephalic and atraumatic.  Right Ear: External ear normal.  Left Ear: External ear normal.  Nose: Nose normal. No mucosal edema or rhinorrhea.  Mouth/Throat: Oropharynx is clear and moist and mucous membranes are normal. No dental abscesses or uvula swelling.  Eyes: Conjunctivae and EOM are normal. Pupils are equal, round, and reactive to light.  Neck: Normal range of motion and full passive range of motion without pain. Neck supple.  Cardiovascular: Normal rate, regular rhythm and normal heart sounds.  Exam reveals no gallop and no friction rub.   No murmur heard. Pulmonary/Chest: Effort normal and breath sounds normal. No respiratory distress. She has no wheezes. She has no rhonchi. She has no rales. She exhibits no tenderness and no crepitus.  Abdominal: Soft. Normal appearance and bowel sounds are normal. She exhibits no distension. There is tenderness. There is no rebound and no guarding.    Patient was tender diffusely in her lower abdomen that she is most tender in her left lower quadrant.  Musculoskeletal: Normal range of motion. She exhibits no edema or tenderness.  Moves all extremities well.   Neurological: She is alert and oriented to person, place, and time. She has normal strength. No cranial nerve deficit.  Skin: Skin is warm, dry and intact. No rash noted. No erythema. No pallor.  Psychiatric: She has a normal mood and affect. Her speech is normal and behavior is normal. Her mood appears not anxious.  Nursing note and vitals reviewed.    ED Treatments / Results  Labs (all labs ordered are  listed, but only abnormal results are displayed) Results for orders placed or performed during the hospital encounter of 07/05/16  CBC with Differential  Result Value Ref Range   WBC 10.4 4.0 - 10.5 K/uL   RBC 3.92 3.87 - 5.11 MIL/uL   Hemoglobin 10.7 (L) 12.0 - 15.0 g/dL   HCT 54.032.0 (L) 98.136.0 - 19.146.0 %   MCV 81.6 78.0 - 100.0 fL   MCH 27.3 26.0 - 34.0 pg   MCHC 33.4 30.0 - 36.0 g/dL   RDW 47.813.5 29.511.5 - 62.115.5 %   Platelets 256 150 - 400 K/uL   Neutrophils Relative % 50 %   Neutro Abs 5.2 1.7 - 7.7 K/uL   Lymphocytes Relative 37 %   Lymphs Abs 3.9 0.7 - 4.0  K/uL   Monocytes Relative 3 %   Monocytes Absolute 0.3 0.1 - 1.0 K/uL   Eosinophils Relative 9 %   Eosinophils Absolute 0.9 (H) 0.0 - 0.7 K/uL   Basophils Relative 1 %   Basophils Absolute 0.1 0.0 - 0.1 K/uL  Basic metabolic panel  Result Value Ref Range   Sodium 140 135 - 145 mmol/L   Potassium 3.8 3.5 - 5.1 mmol/L   Chloride 107 101 - 111 mmol/L   CO2 24 22 - 32 mmol/L   Glucose, Bld 151 (H) 65 - 99 mg/dL   BUN 20 6 - 20 mg/dL   Creatinine, Ser 1.61 0.44 - 1.00 mg/dL   Calcium 8.9 8.9 - 09.6 mg/dL   GFR calc non Af Amer >60 >60 mL/min   GFR calc Af Amer >60 >60 mL/min   Anion gap 9 5 - 15  hCG, quantitative, pregnancy  Result Value Ref Range   hCG, Beta Chain, Quant, S 40 (H) <5 mIU/mL   Laboratory interpretation all normal except Except mildly lower beta hCG, hyperglycemia, mild anemia (her hemoglobin was 12.6 on April 2)    Review of her prior beta hCGs shows on June 11 it was 9, June 13 it was  31, June 20 it was 86, June 27 it was 65.   EKG  EKG Interpretation None       Radiology No results found.  Procedures Procedures (including critical care time)  Medications Ordered in ED Medications  sodium chloride 0.9 % bolus 1,000 mL (1,000 mLs Intravenous New Bag/Given 07/05/16 0529)  sodium chloride 0.9 % bolus 500 mL (500 mLs Intravenous New Bag/Given 07/05/16 0530)  ondansetron (ZOFRAN) injection 4 mg (4  mg Intravenous Given 07/05/16 0531)     Initial Impression / Assessment and Plan / ED Course  I have reviewed the triage vital signs and the nursing notes.  Pertinent labs & imaging results that were available during my care of the patient were reviewed by me and considered in my medical decision making (see chart for details).  When [facility] there was some concern of chronic ectopic pregnancy, etc. due to the patient's acute worsening of pain and vaginal bleeding ultrasound was ordered.  After reviewing her ultrasound there is no clear-cut ectopic and no sign of a ruptured ectopic such as a lot of free fluid in the pelvis. I will talk to her OB/GYN.  07:43 AM Dr Emelda Fear, OB/GYN, will look at her chart and call me back.   08:05 AM Dr Emelda Fear, will come talk to patient about options to consider today for diagnosis/treatment  Pt informed of her test results and that Dr Emelda Fear is going to come talk to her. She is requesting something to eat or drink, she was made NPO. She states she is feeling better, but still has some LLQ pain.   Final Clinical Impressions(s) / ED Diagnoses   Final diagnoses:  Pregnancy  LLQ pain    Disposition pending   Devoria Albe, MD, Concha Pyo, MD 07/05/16 [phone]

## 2016-07-05 NOTE — ED Notes (Addendum)
Pt inquiring about food/drink. Dr. Emelda FearFerguson stated pt able to eat/drink. Dr. Emelda FearFerguson also reported to [facility] discharge from [facility]. Dr .[facility]erguson [facility] Pt and pt spouse informed and verbalized plan of care post discharge. Pt given water at this time as well.

## 2016-07-05 NOTE — Discharge Instructions (Signed)
[facility] after you are discharged from the Emergency Department; they are expecting you. Avoid strenuous activity and do NOT place anything into your vagina, ie: no douching, no tampons, no sexual intercourse, no swimming or tub baths, until you are seen in follow up by your regular OB/GYN doctor.  Call your regular OB/GYN doctor on Monday morning to schedule a follow up appointment on Monday.  Return to the Emergency Department immediately if worsening.

## 2016-07-05 NOTE — ED Triage Notes (Signed)
Pt went to her obgyn and was told she is having a miscarriage; pt has been vomiting earlier tonight with vaginal bleeding; pt states she took a zofran around 2am and a hydrocodone and ibuprofen around 2200 last night

## 2016-07-06 ENCOUNTER — Inpatient Hospital Stay (HOSPITAL_COMMUNITY): Payer: BLUE CROSS/BLUE SHIELD

## 2016-07-06 ENCOUNTER — Encounter (HOSPITAL_COMMUNITY): Payer: Self-pay

## 2016-07-06 ENCOUNTER — Inpatient Hospital Stay (EMERGENCY_DEPARTMENT_HOSPITAL)
Admission: AD | Admit: 2016-07-06 | Discharge: 2016-07-07 | Disposition: A | Discharge status: Home / Self Care | Payer: BLUE CROSS/BLUE SHIELD | Source: Ambulatory Visit | Attending: Obstetrics & Gynecology | Admitting: Obstetrics & Gynecology

## 2016-07-06 DIAGNOSIS — O034 Incomplete spontaneous abortion without complication: Secondary | ICD-10-CM | POA: Diagnosis not present

## 2016-07-06 DIAGNOSIS — O26899 Other specified pregnancy related conditions, unspecified trimester: Secondary | ICD-10-CM

## 2016-07-06 DIAGNOSIS — R109 Unspecified abdominal pain: Secondary | ICD-10-CM | POA: Insufficient documentation

## 2016-07-06 DIAGNOSIS — R1032 Left lower quadrant pain: Secondary | ICD-10-CM | POA: Diagnosis not present

## 2016-07-06 DIAGNOSIS — Z8614 Personal history of Methicillin resistant Staphylococcus aureus infection: Secondary | ICD-10-CM | POA: Diagnosis not present

## 2016-07-06 DIAGNOSIS — O009 Unspecified ectopic pregnancy without intrauterine pregnancy: Secondary | ICD-10-CM

## 2016-07-06 DIAGNOSIS — Z79899 Other long term (current) drug therapy: Secondary | ICD-10-CM | POA: Diagnosis not present

## 2016-07-06 DIAGNOSIS — Z6837 Body mass index (BMI) 37.0-37.9, adult: Secondary | ICD-10-CM | POA: Diagnosis not present

## 2016-07-06 DIAGNOSIS — O26891 Other specified pregnancy related conditions, first trimester: Secondary | ICD-10-CM | POA: Diagnosis not present

## 2016-07-06 DIAGNOSIS — I10 Essential (primary) hypertension: Secondary | ICD-10-CM | POA: Diagnosis not present

## 2016-07-06 LAB — CBC
HCT: 26.3 % — ABNORMAL LOW (ref 36.0–46.0)
HEMOGLOBIN: 8.8 g/dL — AB (ref 12.0–15.0)
MCH: 27.5 pg (ref 26.0–34.0)
MCHC: 33.5 g/dL (ref 30.0–36.0)
MCV: 82.2 fL (ref 78.0–100.0)
PLATELETS: 232 10*3/uL (ref 150–400)
RBC: 3.2 MIL/uL — AB (ref 3.87–5.11)
RDW: 14 % (ref 11.5–15.5)
WBC: 12.5 10*3/uL — AB (ref 4.0–10.5)

## 2016-07-06 LAB — HCG, QUANTITATIVE, PREGNANCY: HCG, BETA CHAIN, QUANT, S: 22 m[IU]/mL — AB (ref <5)

## 2016-07-06 NOTE — MAU Provider Note (Signed)
History     CSN: [id number]  Arrival date and time: 07/06/16 2154  First Provider Initiated Contact with Patient 07/06/16 2222      Chief Complaint  Patient presents with  . Abdominal Pain   HPI DAWN CONVERY is 26 y.o. [id number] female who presents for abdominal pain. Patient was seen in ED & [facility]U on Saturday d/t pain in pregnancy. BHCG being followed at [facility] for failed pregnancy with inappropriate changes in BHCG. Patient was given cytotec & methotrexate on Saturday for SAB vs ectopic management. Reports increase in LLQ pain since 8 pm tonight. Reports taking ibuprofen & 2 percocet without relief. Rates pain 7/10. Pain worse with movement. Denies fever/chills, vaginal bleeding, dysuria, diarrhea, or constipation.    Past Medical History:  Diagnosis Date  . Anemia   . Benign essential HTN 10/09/2014  . Hidradenitis suppurativa 10/09/2014  . Menorrhagia with irregular cycle 06/11/2015  . Morbid obesity (HCC) 10/09/2014  . MRSA infection 10/09/2014  . Pregnancy induced hypertension     Past Surgical History:  Procedure Laterality Date  . CESAREAN SECTION N/A 04/23/2012   Procedure: CESAREAN SECTION;  Surgeon: Tereso Newcomer, MD;  Location: [facility];  Service: Obstetrics;  Laterality: N/A;    Family History  Problem Relation Age of Onset  . HIV Mother   . Cancer Mother   . Heart Problems Paternal Grandfather   . Diabetes Paternal Uncle   . Other Sister        hidradenitis suppurativa    Social History  Substance Use Topics  . Smoking status: Never Smoker  . Smokeless tobacco: Never Used  . Alcohol use No    Allergies: No Known Allergies  Prescriptions Prior to Admission  Medication Sig Dispense Refill Last Dose  . labetalol (NORMODYNE) 100 MG tablet Take 1 tablet (100 mg total) by mouth 2 (two) times daily. 60 tablet 6 07/05/2016 at Unknown time  . oxyCODONE-acetaminophen (PERCOCET/ROXICET) 5-325 MG tablet Take 1 tablet by mouth every 4 (four) hours as needed for  severe pain. 10 tablet 0 07/06/2016 at Unknown time  . promethazine (PHENERGAN) 25 MG tablet Take 0.5-1 tablets (12.5-25 mg total) by mouth every 6 (six) hours as needed. 30 tablet 0 07/06/2016 at Unknown time  . HUMIRA PEN-CROHNS STARTER 40 MG/0.8ML PNKT   0     Review of Systems  Constitutional: Negative.   Respiratory: Negative for cough.   Gastrointestinal: Positive for abdominal pain. Negative for constipation, diarrhea, nausea and vomiting.  Genitourinary: Negative.    Physical Exam   Blood pressure 135/74, pulse 93, temperature 99.1 F (37.3 C), temperature source Oral, resp. rate 16, SpO2 99 %.  Temp:  [98.8 F (37.1 C)-99.1 F (37.3 C)] 99.1 F (37.3 C) (07/02 0055) Pulse Rate:  [89-93] 93 (07/02 0055) Resp:  [16] 16 (07/02 0055) BP: (124-135)/(74-83) 135/74 (07/02 0055) SpO2:  [99 %] 99 % (07/01 2159)   Physical Exam  Nursing note and vitals reviewed. Constitutional: She is oriented to person, place, and time. She appears well-developed and well-nourished. No distress.  HENT:  Head: Normocephalic and atraumatic.  Eyes: Conjunctivae are normal. Right eye exhibits no discharge. Left eye exhibits no discharge. No scleral icterus.  Neck: Normal range of motion.  Cardiovascular: Normal rate, regular rhythm and normal heart sounds.   No murmur heard. Respiratory: Effort normal and breath sounds normal. No respiratory distress. She has no wheezes.  GI: Soft. Bowel sounds are normal. She exhibits no distension. There is tenderness in the  left lower quadrant. There is no rebound and no guarding.  Neurological: She is alert and oriented to person, place, and time.  Skin: Skin is warm and dry. She is not diaphoretic.  Psychiatric: She has a normal mood and affect. Her behavior is normal. Judgment and thought content normal.    MAU Course  Procedures Results for orders placed or performed during the hospital encounter of 07/06/16 (from the past 24 hour(s))  CBC     Status:  Abnormal   Collection Time: 07/06/16 10:13 PM  Result Value Ref Range   WBC 12.5 (H) 4.0 - 10.5 K/uL   RBC 3.20 (L) 3.87 - 5.11 MIL/uL   Hemoglobin 8.8 (L) 12.0 - 15.0 g/dL   HCT 29.526.3 (L) 62.136.0 - 30.846.0 %   MCV 82.2 78.0 - 100.0 fL   MCH 27.5 26.0 - 34.0 pg   MCHC 33.5 30.0 - 36.0 g/dL   RDW 65.714.0 84.611.5 - 96.215.5 %   Platelets 232 150 - 400 K/uL  hCG, quantitative, pregnancy     Status: Abnormal   Collection Time: 07/06/16 10:13 PM  Result Value Ref Range   hCG, Beta Chain, Quant, S 22 (H) <5 mIU/mL   [place] Ob Transvaginal  Result Date: 07/07/2016 CLINICAL DATA:  Left lower quadrant abdominal pain. An ultrasound from July 05, 2016 demonstrated no visualized intrauterine pregnancy. Today's history indicates ectopic pregnancy. Light uterine bleeding. EXAM: TRANSVAGINAL OB ULTRASOUND TECHNIQUE: Transvaginal ultrasound was performed for complete evaluation of the gestation as well as the maternal uterus, adnexal regions, and pelvic cul-de-sac. COMPARISON:  July 05, 2016 FINDINGS: Intrauterine gestational sac: No definitive intrauterine gestational sac. There is an irregular fluid collection identified within the endometrial canal. Yolk sac:  Not Visualized. Embryo:  Not Visualized. Maternal uterus/adnexae: Both ovaries are normal in appearance. The endometrium is again thickened. There is moderate fluid in the endometrial canal which is irregular in shape and unchanged in the interval. IMPRESSION: 1. No intrauterine pregnancy or adnexal mass is identified. This can be seen in the setting of ectopic pregnancy, recent miscarriage, or early pregnancy. Recommend clinical correlation and follow-up as clinically warranted. Electronically Signed   By: Gerome Samavid  Williams III M.D   On: 07/07/2016 00:17    MDM Component     Latest Ref Rng & Units 06/16/2016 06/18/2016 06/25/2016 07/02/2016  hCG Quant     mIU/mL 60 61 49 53   Component     Latest Ref Rng & Units 07/05/2016 07/06/2016  HCG, Beta Chain, Quant, S     <5  mIU/mL 40 (H) 22 (H)    VSS CBC, BHCG Ultrasound -- no iup or adnexal mass BHCG dropping from yesterday Dilaudid 1 mg IM -- pt reports moderate improvement in pain S/w Dr. Josph Norfleet FullingHarraway-Smith. Reviewed labs, exam, & ultrasounds. Patient stable to discharge. Pt to have close follow up in office tomorrow (Monday)  Assessment and Plan  A: 1. Ectopic pregnancy   2. Abdominal pain affecting pregnancy    P: Discharge home D/c percocet & ibuprofen Rx dilaudid #10 [facility] (msg sent to office) Discussed reasons to return to MAU  Judeth HornErin Loann Chahal 07/06/2016, 10:22 PM

## 2016-07-06 NOTE — MAU Note (Addendum)
Pt states she was here yesterday morning. Pt states she was given medication to help pass pregnancy. Started having left lower abdominal pain that radiates into back around 1.5 hours ago. Pt states she only has a small amount of bleeding.

## 2016-07-07 ENCOUNTER — Encounter (HOSPITAL_COMMUNITY): Payer: Self-pay

## 2016-07-07 ENCOUNTER — Encounter (HOSPITAL_COMMUNITY): Payer: Self-pay | Admitting: Student

## 2016-07-07 ENCOUNTER — Ambulatory Visit (INDEPENDENT_AMBULATORY_CARE_PROVIDER_SITE_OTHER): Payer: BLUE CROSS/BLUE SHIELD | Admitting: Obstetrics and Gynecology

## 2016-07-07 ENCOUNTER — Ambulatory Visit (HOSPITAL_COMMUNITY): Payer: BLUE CROSS/BLUE SHIELD | Admitting: Anesthesiology

## 2016-07-07 ENCOUNTER — Ambulatory Visit (HOSPITAL_COMMUNITY)
Admission: RE | Admit: 2016-07-07 | Discharge: 2016-07-07 | Disposition: A | Discharge status: Home / Self Care | Payer: BLUE CROSS/BLUE SHIELD | Source: Ambulatory Visit | Attending: Obstetrics and Gynecology | Admitting: Obstetrics and Gynecology

## 2016-07-07 ENCOUNTER — Encounter (HOSPITAL_COMMUNITY)
Admission: RE | Disposition: A | Discharge status: Home / Self Care | Payer: Self-pay | Source: Ambulatory Visit | Attending: Obstetrics and Gynecology

## 2016-07-07 ENCOUNTER — Encounter: Payer: Self-pay | Admitting: Obstetrics and Gynecology

## 2016-07-07 ENCOUNTER — Other Ambulatory Visit: Payer: Self-pay | Admitting: Obstetrics and Gynecology

## 2016-07-07 VITALS — BP 112/60 | HR 100 | Wt 210.6 lb

## 2016-07-07 DIAGNOSIS — I10 Essential (primary) hypertension: Secondary | ICD-10-CM | POA: Insufficient documentation

## 2016-07-07 DIAGNOSIS — O034 Incomplete spontaneous abortion without complication: Secondary | ICD-10-CM | POA: Diagnosis not present

## 2016-07-07 DIAGNOSIS — O009 Unspecified ectopic pregnancy without intrauterine pregnancy: Secondary | ICD-10-CM

## 2016-07-07 DIAGNOSIS — O26891 Other specified pregnancy related conditions, first trimester: Secondary | ICD-10-CM | POA: Diagnosis not present

## 2016-07-07 DIAGNOSIS — O021 Missed abortion: Secondary | ICD-10-CM | POA: Diagnosis not present

## 2016-07-07 DIAGNOSIS — R1032 Left lower quadrant pain: Secondary | ICD-10-CM | POA: Diagnosis not present

## 2016-07-07 DIAGNOSIS — O031 Delayed or excessive hemorrhage following incomplete spontaneous abortion: Secondary | ICD-10-CM | POA: Diagnosis not present

## 2016-07-07 DIAGNOSIS — Z6837 Body mass index (BMI) 37.0-37.9, adult: Secondary | ICD-10-CM | POA: Insufficient documentation

## 2016-07-07 DIAGNOSIS — Z8614 Personal history of Methicillin resistant Staphylococcus aureus infection: Secondary | ICD-10-CM | POA: Insufficient documentation

## 2016-07-07 DIAGNOSIS — Z79899 Other long term (current) drug therapy: Secondary | ICD-10-CM | POA: Insufficient documentation

## 2016-07-07 DIAGNOSIS — O039 Complete or unspecified spontaneous abortion without complication: Secondary | ICD-10-CM | POA: Diagnosis not present

## 2016-07-07 HISTORY — PX: DILATION AND CURETTAGE OF UTERUS: SHX78

## 2016-07-07 LAB — CBC
HCT: 27.3 % — ABNORMAL LOW (ref 36.0–46.0)
Hemoglobin: 9.1 g/dL — ABNORMAL LOW (ref 12.0–15.0)
MCH: 27.1 pg (ref 26.0–34.0)
MCHC: 33.3 g/dL (ref 30.0–36.0)
MCV: 81.3 fL (ref 78.0–100.0)
PLATELETS: 240 10*3/uL (ref 150–400)
RBC: 3.36 MIL/uL — ABNORMAL LOW (ref 3.87–5.11)
RDW: 13.9 % (ref 11.5–15.5)
WBC: 10.7 10*3/uL — AB (ref 4.0–10.5)

## 2016-07-07 LAB — TYPE AND SCREEN
ABO/RH(D): A POS
Antibody Screen: NEGATIVE

## 2016-07-07 LAB — SURGICAL PCR SCREEN
MRSA, PCR: NEGATIVE
Staphylococcus aureus: NEGATIVE

## 2016-07-07 SURGERY — DILATION AND CURETTAGE
Anesthesia: General

## 2016-07-07 MED ORDER — FENTANYL CITRATE (PF) 100 MCG/2ML IJ SOLN
25.0000 ug | INTRAMUSCULAR | Status: DC | PRN
  Administered 2016-07-07: 50 ug via INTRAVENOUS

## 2016-07-07 MED ORDER — ONDANSETRON HCL 4 MG/2ML IJ SOLN
INTRAMUSCULAR | Status: AC
  Filled 2016-07-07: qty 2

## 2016-07-07 MED ORDER — GLYCOPYRROLATE 0.2 MG/ML IJ SOLN
0.2000 mg | Freq: Once | INTRAMUSCULAR | Status: AC
  Administered 2016-07-07: 0.2 mg via INTRAVENOUS
  Filled 2016-07-07: qty 1

## 2016-07-07 MED ORDER — FENTANYL CITRATE (PF) 100 MCG/2ML IJ SOLN
INTRAMUSCULAR | Status: DC | PRN
  Administered 2016-07-07 (×2): 50 ug via INTRAVENOUS

## 2016-07-07 MED ORDER — PROPOFOL 10 MG/ML IV BOLUS
INTRAVENOUS | Status: DC | PRN
  Administered 2016-07-07 (×2): 50 mg via INTRAVENOUS
  Administered 2016-07-07: 20 mg via INTRAVENOUS
  Administered 2016-07-07: 30 mg via INTRAVENOUS
  Administered 2016-07-07: 150 mg via INTRAVENOUS
  Administered 2016-07-07: 50 mg via INTRAVENOUS

## 2016-07-07 MED ORDER — LIDOCAINE HCL (CARDIAC) 10 MG/ML IV SOLN
INTRAVENOUS | Status: DC | PRN
  Administered 2016-07-07: 50 mg via INTRAVENOUS

## 2016-07-07 MED ORDER — ROCURONIUM BROMIDE 100 MG/10ML IV SOLN
INTRAVENOUS | Status: DC | PRN
  Administered 2016-07-07: 5 mg via INTRAVENOUS

## 2016-07-07 MED ORDER — LIDOCAINE HCL (PF) 1 % IJ SOLN
INTRAMUSCULAR | Status: AC
  Filled 2016-07-07: qty 25

## 2016-07-07 MED ORDER — PROPOFOL 10 MG/ML IV BOLUS
INTRAVENOUS | Status: AC
  Filled 2016-07-07: qty 40

## 2016-07-07 MED ORDER — CEFAZOLIN SODIUM-DEXTROSE 2-4 GM/100ML-% IV SOLN
2.0000 g | Freq: Once | INTRAVENOUS | Status: AC
  Administered 2016-07-07: 2 g via INTRAVENOUS
  Filled 2016-07-07: qty 100

## 2016-07-07 MED ORDER — OXYTOCIN 10 UNIT/ML IJ SOLN
INTRAMUSCULAR | Status: AC
  Filled 2016-07-07: qty 4

## 2016-07-07 MED ORDER — FENTANYL CITRATE (PF) 100 MCG/2ML IJ SOLN
INTRAMUSCULAR | Status: AC
  Filled 2016-07-07: qty 2

## 2016-07-07 MED ORDER — ONDANSETRON HCL 4 MG/2ML IJ SOLN
4.0000 mg | Freq: Once | INTRAMUSCULAR | Status: AC
  Administered 2016-07-07: 4 mg via INTRAVENOUS
  Filled 2016-07-07: qty 2

## 2016-07-07 MED ORDER — ONDANSETRON HCL 4 MG/2ML IJ SOLN
INTRAMUSCULAR | Status: DC | PRN
  Administered 2016-07-07: 4 mg via INTRAVENOUS

## 2016-07-07 MED ORDER — OXYTOCIN 40 UNITS IN LACTATED RINGERS INFUSION - SIMPLE MED
INTRAVENOUS | Status: DC | PRN
  Administered 2016-07-07: 4 mL via INTRAVENOUS

## 2016-07-07 MED ORDER — DEXAMETHASONE SODIUM PHOSPHATE 4 MG/ML IJ SOLN
INTRAMUSCULAR | Status: DC | PRN
  Administered 2016-07-07: 4 mg via INTRAVENOUS

## 2016-07-07 MED ORDER — HYDROMORPHONE HCL 2 MG PO TABS: 2.0000 mg | ORAL_TABLET | ORAL | 0 refills | Status: DC | PRN

## 2016-07-07 MED ORDER — ROCURONIUM BROMIDE 50 MG/5ML IV SOLN
INTRAVENOUS | Status: AC
  Filled 2016-07-07: qty 2

## 2016-07-07 MED ORDER — HYDROMORPHONE HCL 1 MG/ML IJ SOLN
1.0000 mg | Freq: Once | INTRAMUSCULAR | Status: AC
  Administered 2016-07-07: 1 mg via INTRAMUSCULAR
  Filled 2016-07-07: qty 1

## 2016-07-07 MED ORDER — BUPIVACAINE-EPINEPHRINE 0.5% -1:200000 IJ SOLN
INTRAMUSCULAR | Status: DC | PRN
  Administered 2016-07-07: 20 mL

## 2016-07-07 MED ORDER — GLYCOPYRROLATE 0.2 MG/ML IJ SOLN
INTRAMUSCULAR | Status: AC
  Filled 2016-07-07: qty 3

## 2016-07-07 MED ORDER — DEXAMETHASONE SODIUM PHOSPHATE 4 MG/ML IJ SOLN
INTRAMUSCULAR | Status: AC
  Filled 2016-07-07: qty 2

## 2016-07-07 MED ORDER — DEXAMETHASONE SODIUM PHOSPHATE 4 MG/ML IJ SOLN
INTRAMUSCULAR | Status: AC
  Filled 2016-07-07: qty 1

## 2016-07-07 MED ORDER — SUGAMMADEX SODIUM 500 MG/5ML IV SOLN
INTRAVENOUS | Status: AC
  Filled 2016-07-07: qty 5

## 2016-07-07 MED ORDER — MIDAZOLAM HCL 2 MG/2ML IJ SOLN
1.0000 mg | INTRAMUSCULAR | Status: AC
  Administered 2016-07-07: 2 mg via INTRAVENOUS
  Filled 2016-07-07: qty 2

## 2016-07-07 MED ORDER — LACTATED RINGERS IV SOLN
INTRAVENOUS | Status: DC
  Administered 2016-07-07 (×2): via INTRAVENOUS

## 2016-07-07 MED ORDER — SUCCINYLCHOLINE CHLORIDE 20 MG/ML IJ SOLN
INTRAMUSCULAR | Status: AC
  Filled 2016-07-07: qty 1

## 2016-07-07 MED ORDER — TRAMADOL HCL 50 MG PO TABS: 50.0000 mg | ORAL_TABLET | Freq: Four times a day (QID) | ORAL | 0 refills | Status: DC | PRN

## 2016-07-07 MED ORDER — SUCCINYLCHOLINE CHLORIDE 20 MG/ML IJ SOLN
INTRAMUSCULAR | Status: DC | PRN
  Administered 2016-07-07: 120 mg via INTRAVENOUS

## 2016-07-07 MED ORDER — BUPIVACAINE-EPINEPHRINE (PF) 0.5% -1:200000 IJ SOLN
INTRAMUSCULAR | Status: AC
  Filled 2016-07-07: qty 30

## 2016-07-07 MED ORDER — LABETALOL HCL 5 MG/ML IV SOLN
INTRAVENOUS | Status: AC
  Filled 2016-07-07: qty 4

## 2016-07-07 SURGICAL SUPPLY — 31 items
BAG HAMPER (MISCELLANEOUS) ×2 IMPLANT
CATH ROBINSON RED A/P 14FR (CATHETERS) IMPLANT
CLOTH BEACON ORANGE TIMEOUT ST (SAFETY) ×2 IMPLANT
COVER LIGHT HANDLE STERIS (MISCELLANEOUS) ×4 IMPLANT
COVER MAYO STAND XLG (DRAPE) ×2 IMPLANT
CURETTE VACUUM 9MM CVD CLR (CANNULA) IMPLANT
DECANTER SPIKE VIAL GLASS SM (MISCELLANEOUS) ×1 IMPLANT
FORMALIN 10 PREFIL 480ML (MISCELLANEOUS) ×2 IMPLANT
GAUZE SPONGE 4X4 16PLY XRAY LF (GAUZE/BANDAGES/DRESSINGS) ×2 IMPLANT
GLOVE BIOGEL PI IND STRL 7.0 (GLOVE) ×1 IMPLANT
GLOVE BIOGEL PI IND STRL 9 (GLOVE) ×1 IMPLANT
GLOVE BIOGEL PI INDICATOR 7.0 (GLOVE) ×1
GLOVE BIOGEL PI INDICATOR 9 (GLOVE) ×1
GLOVE ECLIPSE 9.0 STRL (GLOVE) ×2 IMPLANT
GOWN SPEC L3 XXLG W/TWL (GOWN DISPOSABLE) ×2 IMPLANT
GOWN STRL REUS W/TWL LRG LVL3 (GOWN DISPOSABLE) ×2 IMPLANT
KIT BERKELEY 1ST TRIMESTER 3/8 (MISCELLANEOUS) ×2 IMPLANT
KIT ROOM TURNOVER AP CYSTO (KITS) ×2 IMPLANT
MARKER SKIN DUAL TIP RULER LAB (MISCELLANEOUS) ×2 IMPLANT
NDL SPNL 22GX3.5 QUINCKE BK (NEEDLE) ×1 IMPLANT
NEEDLE SPNL 22GX3.5 QUINCKE BK (NEEDLE) ×2 IMPLANT
NS IRRIG 1000ML POUR BTL (IV SOLUTION) ×2 IMPLANT
PACK BASIC III (CUSTOM PROCEDURE TRAY) ×2
PACK SRG BSC III STRL LF ECLPS (CUSTOM PROCEDURE TRAY) ×1 IMPLANT
PAD ARMBOARD 7.5X6 YLW CONV (MISCELLANEOUS) ×2 IMPLANT
SET BERKELEY SUCTION TUBING (SUCTIONS) ×2 IMPLANT
SYR CONTROL 10ML LL (SYRINGE) ×1 IMPLANT
TOWEL OR 17X26 4PK STRL BLUE (TOWEL DISPOSABLE) ×2 IMPLANT
VACURETTE 10MM (CANNULA) IMPLANT
VACURETTE 12MM (CANNULA) IMPLANT
VACURETTE 8MM (CANNULA) ×1 IMPLANT

## 2016-07-07 NOTE — Discharge Instructions (Signed)
Dilation and Curettage or Vacuum Curettage, Care After These instructions give you information about caring for yourself after your procedure. Your doctor may also give you more specific instructions. Call your doctor if you have any problems or questions after your procedure. Follow these instructions at home: Activity  Do not drive or use heavy machinery while taking prescription pain medicine.  For 24 hours after your procedure, avoid driving.  Take short walks often, followed by rest periods. Ask your doctor what activities are safe for you. After one or two days, you may be able to return to your normal activities.  Do not lift anything that is heavier than 10 lb (4.5 kg) until your doctor approves.  For at least 2 weeks, or as long as told by your doctor: ? Do not douche. ? Do not use tampons. ? Do not have sex. General instructions  Take over-the-counter and prescription medicines only as told by your doctor. This is very important if you take blood thinning medicine.  Do not take baths, swim, or use a hot tub until your doctor approves. Take showers instead of baths.  Wear compression stockings as told by your doctor.  It is up to you to get the results of your procedure. Ask your doctor when your results will be ready.  Keep all follow-up visits as told by your doctor. This is important. Contact a doctor if:  You have very bad cramps that get worse or do not get better with medicine.  You have very bad pain in your belly (abdomen).  You cannot drink fluids without throwing up (vomiting).  You get pain in a different part of the area between your belly and thighs (pelvis).  You have bad-smelling discharge from your vagina.  You have a rash. Get help right away if:  You are bleeding a lot from your vagina. A lot of bleeding means soaking more than one sanitary pad in an hour, for 2 hours in a row.  You have clumps of blood (blood clots) coming from your  vagina.  You have a fever or chills.  Your belly feels very tender or hard.  You have chest pain.  You have trouble breathing.  You cough up blood.  You feel dizzy.  You feel light-headed.  You pass out (faint).  You have pain in your neck or shoulder area. Summary  Take short walks often, followed by rest periods. Ask your doctor what activities are safe for you. After one or two days, you may be able to return to your normal activities.  Do not lift anything that is heavier than 10 lb (4.5 kg) until your doctor approves.  Do not take baths, swim, or use a hot tub until your doctor approves. Take showers instead of baths.  Contact your doctor if you have any symptoms of infection, like bad-smelling discharge from your vagina. This information is not intended to replace advice given to you by your health care provider. Make sure you discuss any questions you have with your health care provider. Document Released: 10/02/2007 Document Revised: 09/10/2015 Document Reviewed: 09/10/2015 Elsevier Interactive Patient Education  2017 [employer]. Incomplete Miscarriage A miscarriage is the sudden loss of an unborn baby (fetus) before the 20th week of pregnancy. In an incomplete miscarriage, parts of the fetus or placenta (afterbirth) remain in the body. Having a miscarriage can be an emotional experience. Talk with your health care provider about any questions you may have about miscarrying, the grieving process, and your  future pregnancy plans. What are the causes?  Problems with the fetal chromosomes that make it impossible for the baby to develop normally. Problems with the baby's genes or chromosomes are most often the result of errors that occur by chance as the embryo divides and grows. The problems are not inherited from the parents.  Infection of the cervix or uterus.  Hormone problems.  Problems with the cervix, such as having an incompetent cervix. This is when the  tissue in the cervix is not strong enough to hold the pregnancy.  Problems with the uterus, such as an abnormally shaped uterus, uterine fibroids, or congenital abnormalities.  Certain medical conditions.  Smoking, drinking alcohol, or taking illegal drugs.  Trauma. What are the signs or symptoms?  Vaginal bleeding or spotting, with or without cramps or pain.  Pain or cramping in the abdomen or lower back.  Passing fluid, tissue, or blood clots from the vagina. How is this diagnosed? Your health care provider will perform a physical exam. You may also have an ultrasound to confirm the miscarriage. Blood or urine tests may also be ordered. How is this treated?  Usually, a dilation and curettage (D&C) procedure is performed. During a D&C procedure, the cervix is widened (dilated) and any remaining fetal or placental tissue is gently removed from the uterus.  Antibiotic medicines are prescribed if there is an infection. Other medicines may be given to reduce the size of the uterus (contract) if there is a lot of bleeding.  If you have Rh negative blood and your baby was Rh positive, you will need a Rho (D) immune globulin shot. This shot will protect any future baby from having Rh blood problems in future pregnancies.  You may be confined to bed rest. This means you should stay in bed and only get up to use the bathroom. Follow these instructions at home:  Rest as directed by your health care provider.  Restrict activity as directed by your health care provider. You may be allowed to continue light activity if curettage was not done but you require further treatment.  Keep track of the number of pads you use each day. Keep track of how soaked (saturated) they are. Record this information.  Do not  use tampons.  Do not douche or have sexual intercourse until approved by your health care provider.  Keep all follow-up appointments for reevaluation and continuing management.  Only  take over-the-counter or prescription medicines for pain, fever, or discomfort as directed by your health care provider.  Take antibiotic medicine as directed by your health care provider. Make sure you finish it even if you start to feel better. Get help right away if:  You experience severe cramps in your stomach, back, or abdomen.  You have an unexplained temperature (make sure to record these temperatures).  You pass large clots or tissue (save these for your health care provider to inspect).  Your bleeding increases.  You become light-headed, weak, or have fainting episodes. This information is not intended to replace advice given to you by your health care provider. Make sure you discuss any questions you have with your health care provider. Document Released: 12/23/2004 Document Revised: 05/31/2015 Document Reviewed: 07/22/2012 Elsevier Interactive Patient Education  2017 [employer]

## 2016-07-07 NOTE — Discharge Instructions (Signed)
Return to care   If you have heavier bleeding that soaks through more that 2 pads per hour for an hour or more  If you bleed so much that you feel like you might pass out or you do pass out  If you have significant abdominal pain that is not improved with Tylenol   If you develop a fever > 100.5   Methotrexate Treatment for an Ectopic Pregnancy, Care After Refer to this sheet in the next few weeks. These instructions provide you with information on caring for yourself after your procedure. Your health care provider may also give you more specific instructions. Your treatment has been planned according to current medical practices, but problems sometimes occur. Call your health care provider if you have any problems or questions after your procedure. What can I expect after the procedure? You may have some abdominal cramping, vaginal bleeding, and fatigue in the first few days after taking methotrexate. Some other possible side effects of methotrexate include:  Nausea.  Vomiting.  Diarrhea.  Mouth sores.  Swelling or irritation of the lining of your lungs (pneumonitis).  Liver damage.  Hair loss.  Follow these instructions at home: After you have received the methotrexate medicine, you need to be careful of your activities and watch your condition for several weeks. It may take 1 week before your hormone levels return to normal. Activity  Do not have sexual intercourse until your health care provider says it is safe to do so.  You may resume your usual diet.  Limit strenuous activity.  Do not drink alcohol. General instructions  Do not take aspirin, ibuprofen, or naproxen (nonsteroidal anti-inflammatory drugs [NSAIDs]).  Do not take folic acid, prenatal vitamins, or other vitamins that contain folic acid.  Avoid traveling too far away from your health care provider.  Keep all follow-up visits as told by your health care provider. This is important. Contact a health  care provider if:  You cannot control your nausea and vomiting.  You cannot control your diarrhea.  You have sores in your mouth and want treatment.  You need pain medicine for your abdominal pain.  You have a rash.  You are having a reaction to the medicine. Get help right away if:  You have increasing abdominal or pelvic pain.  You notice increased bleeding.  You feel light-headed, or you faint.  You have shortness of breath.  Your heart rate increases.  You have a cough.  You have chills.  You have a fever. This information is not intended to replace advice given to you by your health care provider. Make sure you discuss any questions you have with your health care provider. Document Released: 12/12/2010 Document Revised: 05/31/2015 Document Reviewed: 10/11/2012 Elsevier Interactive Patient Education  2017 [employer]

## 2016-07-07 NOTE — Brief Op Note (Signed)
07/07/2016  5:50 PM  PATIENT:  Alexis Montgomery  26 y.o. female  PRE-OPERATIVE DIAGNOSIS:  incomplete AB  POST-OPERATIVE DIAGNOSIS:  incomplete AB  PROCEDURE:  Procedure(s): SUCTION DILATATION AND CURETTAGE (N/A)  SURGEON:  Surgeon(s) and Role:    * Tilda BurrowFerguson, Jadelynn Boylan V, MD - Primary  PHYSICIAN ASSISTANT:   ASSISTANTS: none   ANESTHESIA:   local, general and paracervical block  EBL:  Total I/O In: 1200 [I.V.:1200] Out: 600 [Urine:200; Blood:400]  BLOOD ADMINISTERED:none  DRAINS: none   LOCAL MEDICATIONS USED:  MARCAINE    and Amount: 20 ml  SPECIMEN:  Source of Specimen:  Products of conception  DISPOSITION OF SPECIMEN:  PATHOLOGY  COUNTS:  YES  TOURNIQUET:  * No tourniquets in log *  DICTATION: .Dragon Dictation  PLAN OF CARE: Admit to inpatient   PATIENT DISPOSITION:  PACU - hemodynamically stable.   Delay start of Pharmacological VTE agent (>24hrs) due to surgical blood loss or risk of bleeding: not applicable

## 2016-07-07 NOTE — Transfer of Care (Signed)
Immediate Anesthesia Transfer of Care Note  Patient: Alexis Montgomery  Procedure(s) Performed: Procedure(s): SUCTION DILATATION AND CURETTAGE (N/A)  Patient Location: PACU  Anesthesia Type:General  Level of Consciousness: awake and patient cooperative  Airway & Oxygen Therapy: Patient Spontanous Breathing  Post-op Assessment: Report given to RN and Post -op Vital signs reviewed and stable  Post vital signs: Reviewed and stable  Last Vitals:  Vitals:   07/07/16 1416  BP: 119/74  Pulse: (!) 102  Resp: 16  Temp: 37.9 C    Last Pain:  Vitals:   07/07/16 1416  TempSrc: Oral  PainSc: 3          Complications: No apparent anesthesia complications

## 2016-07-07 NOTE — H&P (Signed)
[] Hover for attribution information   [facility] Visit  07/07/2016            Patient name: Alexis Montgomery         MRN [id number]  Date of birth: July 13, 1990  CC & HPI:  Alexis Montgomery is a 26 y.o. female presenting today for pain in lower left pelvic area, and it radiates around to her back, present for 3 weeks. Pt was seen in the ED on 07/05/16, and was sent to [facility] for an ectopic pregnancy. At that time, she was given a methotraxate injection. She has not seen any passage of clots since hospitalization.  Pt took dilaudid at 7am, 2 mg, and again before appointment today. Pt reports associated N/V and decreased appetite. Pt had an [place] last night at the ED during second ED visit, and c/o pain during the process. Pt states this is her second miscarriage in 2 months.  ROS:  ROS +Nausea +vomiting +pelvic pain -fever +decreased appetite  Pertinent History Reviewed:   Reviewed: Significant for PIH, menorrhagia with irregular cycle, previous C-section Medical             Past Medical History:  Diagnosis Date  . Anemia   . Benign essential HTN 10/09/2014  . Hidradenitis suppurativa 10/09/2014  . Menorrhagia with irregular cycle 06/11/2015  . Morbid obesity (HCC) 10/09/2014  . MRSA infection 10/09/2014  . Pregnancy induced hypertension                               Surgical Hx:         Past Surgical History:  Procedure Laterality Date  . CESAREAN SECTION N/A 04/23/2012   Procedure: CESAREAN SECTION;  Surgeon: Tereso Newcomer, MD;  Location: [facility];  Service: Obstetrics;  Laterality: N/A;   Medications: Reviewed & Updated - see associated section                       Current Outpatient Prescriptions:  .  HYDROmorphone (DILAUDID) 2 MG tablet, Take 1 tablet (2 mg total) by mouth every 4 (four) hours as needed for severe pain., Disp: 10 tablet, Rfl: 0 .  promethazine (PHENERGAN) 25 MG tablet, Take 0.5-1 tablets (12.5-25 mg total) by mouth every 6 (six) hours as needed., Disp:  30 tablet, Rfl: 0 .  labetalol (NORMODYNE) 100 MG tablet, Take 1 tablet (100 mg total) by mouth 2 (two) times daily. (Patient not taking: Reported on 07/07/2016), Disp: 60 tablet, Rfl: 6   Social History: Reviewed -  reports that she has never smoked. She has never used smokeless tobacco.  Objective Findings:  Vitals: Blood pressure 112/60, pulse 100, weight 210 lb 9.6 oz (95.5 kg).  Physical Examination:  General appearance - alert, well appearing, and in no distress, oriented to person, place, and time and normal appearing weight Mental status - alert, oriented to person, place, and time Pelvic exam:  VULVA: normal appearing vulva with no masses, tenderness or lesions, VAGINA: normal appearing vagina with normal color and discharge, no lesions,  CERVIX: long and closed, with light menstrual bleeding, normal appearing cervix without discharge or lesions,  UTERUS: uterus is normal size, shape, consistency and tender,  ADNEXA: normal adnexa in size, nontender and no masses.  Bedside U/S Excess tissue in uterine Endometrial cavity, fluid and floating tissue. The cervix is long and closed. History is significant in that the patient had cesarean  section and so the cervix was never dilated. minimal fluid in abdomen No abdominal bleeding seen    Assessment & Plan:   A:  1. Incomplete Ab, With significant pain 2. No evidence of intra-abdominal bleeding and a low suspicion for ectopic at this point based on the absence of any excess fluid or mass effect inside the pelvis P:  1. Scheduled D&C for later today.

## 2016-07-07 NOTE — Anesthesia Preprocedure Evaluation (Addendum)
Anesthesia Evaluation  Patient identified by MRN, date of birth, ID band Patient awake    Reviewed: Allergy & Precautions, H&P , Patient's Chart, lab work & pertinent test results  Airway Mallampati: III  TM Distance: >3 FB Neck ROM: full    Dental  (+) Teeth Intact   Pulmonary neg pulmonary ROS,    breath sounds clear to auscultation       Cardiovascular hypertension, On Medications  Rhythm:regular Rate:Normal     Neuro/Psych    GI/Hepatic   Endo/Other  Morbid obesity  Renal/GU      Musculoskeletal   Abdominal   Peds  Hematology  (+) anemia ,   Anesthesia Other Findings   Pregnancy induced hypertension     On Mg+2, and labatolol     Reproductive/Obstetrics (+) Pregnancy                             Anesthesia Physical Anesthesia Plan  ASA: III and emergent  Anesthesia Plan: General   Post-op Pain Management:    Induction: Intravenous, Rapid sequence and Cricoid pressure planned  PONV Risk Score and Plan:   Airway Management Planned: Oral ETT  Additional Equipment:   Intra-op Plan:   Post-operative Plan: Extubation in OR  Informed Consent: I have reviewed the patients History and Physical, chart, labs and discussed the procedure including the risks, benefits and alternatives for the proposed anesthesia with the patient or authorized representative who has indicated his/her understanding and acceptance.     Plan Discussed with:   Anesthesia Plan Comments:       Anesthesia Quick Evaluation

## 2016-07-07 NOTE — Progress Notes (Signed)
[facility] Visit  07/07/2016            Patient name: Alexis Montgomery MRN [id number]  Date of birth: [birth year]  CC & HPI:  Alexis Montgomery is a 26 y.o. female presenting today for pain in lower left pelvic area, and it radiates around to her back, present for 3 weeks. Pt was seen in the ED on 07/05/16, and was sent to [facility] for an ectopic pregnancy. At that time, she was given a methotraxate injection. She has not seen any passage of clots since hospitalization.  Pt took dilaudid at 7am, 2 mg, and again before appointment today. Pt reports associated N/V and decreased appetite. Pt had an [place] last night at the ED during second ED visit, and c/o pain during the process. Pt states this is her second miscarriage in 2 months.  ROS:  ROS +Nausea +vomiting +pelvic pain -fever +decreased appetite  Pertinent History Reviewed:   Reviewed: Significant for PIH, menorrhagia with irregular cycle, previous C-section Medical         Past Medical History:  Diagnosis Date  . Anemia   . Benign essential HTN 10/09/2014  . Hidradenitis suppurativa 10/09/2014  . Menorrhagia with irregular cycle 06/11/2015  . Morbid obesity (HCC) 10/09/2014  . MRSA infection 10/09/2014  . Pregnancy induced hypertension                               Surgical Hx:    Past Surgical History:  Procedure Laterality Date  . CESAREAN SECTION N/A 04/23/2012   Procedure: CESAREAN SECTION;  Surgeon: Tereso Newcomer, MD;  Location: [facility];  Service: Obstetrics;  Laterality: N/A;   Medications: Reviewed & Updated - see associated section                       Current Outpatient Prescriptions:  .  HYDROmorphone (DILAUDID) 2 MG tablet, Take 1 tablet (2 mg total) by mouth every 4 (four) hours as needed for severe pain., Disp: 10 tablet, Rfl: 0 .  promethazine (PHENERGAN) 25 MG tablet, Take 0.5-1 tablets (12.5-25 mg total) by mouth every 6 (six) hours as needed., Disp: 30 tablet, Rfl: 0 .  labetalol (NORMODYNE) 100 MG tablet, Take  1 tablet (100 mg total) by mouth 2 (two) times daily. (Patient not taking: Reported on 07/07/2016), Disp: 60 tablet, Rfl: 6   Social History: Reviewed -  reports that she has never smoked. She has never used smokeless tobacco.  Objective Findings:  Vitals: Blood pressure 112/60, pulse 100, weight 210 lb 9.6 oz (95.5 kg).  Physical Examination:  General appearance - alert, well appearing, and in no distress, oriented to person, place, and time and normal appearing weight Mental status - alert, oriented to person, place, and time Pelvic exam:  VULVA: normal appearing vulva with no masses, tenderness or lesions, VAGINA: normal appearing vagina with normal color and discharge, no lesions,  CERVIX: long and closed, with light menstrual bleeding, normal appearing cervix without discharge or lesions,  UTERUS: uterus is normal size, shape, consistency and tender,  ADNEXA: normal adnexa in size, nontender and no masses.  Bedside U/S Excess tissue in uterine Endometrial cavity, fluid and floating tissue. The cervix is long and closed. History is significant in that the patient had cesarean section and so the cervix was never dilated. minimal fluid in abdomen No abdominal bleeding seen    Assessment & Plan:  A:  1. Incomplete Ab, With significant pain 2. No evidence of intra-abdominal bleeding and a low suspicion for ectopic at this point based on the absence of any excess fluid or mass effect inside the pelvis P:  1. Scheduled D&C for later today.    By signing my name below, I, Freida BusmanDiana Omoyeni, attest that this documentation has been prepared under the direction and in the presence of Tilda BurrowJohn V Karron Goens, MD . Electronically Signed: Freida Busmaniana Omoyeni, Scribe. 07/07/2016. 12:57 PM. I personally performed the services described in this documentation, which was SCRIBED in my presence. The recorded information has been reviewed and considered accurate. It has been edited as necessary during  review. Tilda BurrowFERGUSON,Kya Mayfield V, MD

## 2016-07-07 NOTE — Addendum Note (Signed)
Addendum  created 07/07/16 1804 by Franco NonesYates, Matheus Spiker S, CRNA   Home Medications modified, Order Reconciliation Section accessed

## 2016-07-07 NOTE — Anesthesia Postprocedure Evaluation (Signed)
Anesthesia Post Note  Patient: Alexis Montgomery N Hallberg  Procedure(s) Performed: Procedure(s) (LRB): SUCTION DILATATION AND CURETTAGE (N/A)  Patient location during evaluation: PACU Anesthesia Type: General Level of consciousness: awake and alert Pain management: satisfactory to patient Vital Signs Assessment: post-procedure vital signs reviewed and stable Respiratory status: spontaneous breathing Cardiovascular status: stable Postop Assessment: no signs of nausea or vomiting Anesthetic complications: no Comments: Nausea now resolved.     Last Vitals:  Vitals:   07/07/16 1700 07/07/16 1715  BP: 115/66 120/68  Pulse: (!) 109 (!) 104  Resp: 18 16  Temp:      Last Pain:  Vitals:   07/07/16 1715  TempSrc:   PainSc: Asleep                 Ventura Leggitt

## 2016-07-07 NOTE — Anesthesia Procedure Notes (Signed)
Procedure Name: Intubation Date/Time: 07/07/2016 4:05 PM Performed by: Franco NonesYATES, Jim Philemon S Pre-anesthesia Checklist: Patient identified, Patient being monitored, Timeout performed, Emergency Drugs available and Suction available Patient Re-evaluated:Patient Re-evaluated prior to inductionOxygen Delivery Method: Circle System Utilized Preoxygenation: Pre-oxygenation with 100% oxygen Intubation Type: IV induction, Rapid sequence and Cricoid Pressure applied Ventilation: Mask ventilation without difficulty Laryngoscope Size: Miller and 2 Grade View: Grade II Tube type: Oral Tube size: 6.0 mm Number of attempts: 2 Airway Equipment and Method: Stylet Placement Confirmation: ETT inserted through vocal cords under direct vision,  positive ETCO2 and breath sounds checked- equal and bilateral Secured at: 21 cm Tube secured with: Tape Dental Injury: Teeth and Oropharynx as per pre-operative assessment  Comments: Good visualization, 7 ETT very tight . 6 Ett inserted with ease.

## 2016-07-07 NOTE — Op Note (Signed)
07/07/2016  5:50 PM  PATIENT:  Alexis Montgomery  26 y.o. female  PRE-OPERATIVE DIAGNOSIS:  incomplete AB  POST-OPERATIVE DIAGNOSIS:  incomplete AB  PROCEDURE:  Procedure(s): SUCTION DILATATION AND CURETTAGE (N/A)  SURGEON:  Surgeon(s) and Role:    * Tilda BurrowFerguson, Danashia Landers V, MD - Primary  PHYSICIAN ASSISTANT:   ASSISTANTS: none   ANESTHESIA:   local, general and paracervical block  EBL:  Total I/O In: 1200 [I.V.:1200] Out: 600 [Urine:200; Blood:400]  BLOOD ADMINISTERED:none  DRAINS: none   LOCAL MEDICATIONS USED:  MARCAINE    and Amount: 20 ml  SPECIMEN:  Source of Specimen:  Products of conception  DISPOSITION OF SPECIMEN:  PATHOLOGY  COUNTS:  YES  TOURNIQUET:  * No tourniquets in log *  DICTATION: .Dragon Dictation  PLAN OF CARE: Admit to inpatient   PATIENT DISPOSITION:  PACU - hemodynamically stable.   Delay start of Pharmacological VTE agent (>24hrs) due to surgical blood loss or risk of bleeding: not applicable Details of procedure: Patient was taken the operating room prepped and draped for vaginal procedure after general anesthesia introduced. Patient was intubated with a 6 mm endotracheal tube due to small airway. Perineum was prepped and draped timeout conducted and Ancef administered procedure confirmed by surgical team. Speculum was inserted and the cervix grasped on the anterior lip with single-tooth tenaculum and uterus sounded in the anteflexed position to 12 cm, dilated to 26 [place] allowing introduction of an 8 mm curved suction curet. There was a generous amount of bleeding occurring during the dilation which likely management to 300-400 cc's. IV oxytocin was infusing by this time and curettage then was performed and the uterus firmed up nicely and the bleeding stopped. Smooth sharp curettage in all quadrants confirmed a uniform gritty feel in the uterus and there was never any suspicion of uterine perforation. Sponge and needle counts were correct patient went to  recovery room in good condition

## 2016-07-08 ENCOUNTER — Encounter (HOSPITAL_COMMUNITY): Payer: Self-pay | Admitting: Obstetrics and Gynecology

## 2016-07-10 ENCOUNTER — Ambulatory Visit: Payer: BLUE CROSS/BLUE SHIELD | Admitting: Obstetrics & Gynecology

## 2016-07-10 ENCOUNTER — Other Ambulatory Visit: Payer: BLUE CROSS/BLUE SHIELD

## 2016-07-14 ENCOUNTER — Encounter: Payer: Self-pay | Admitting: Obstetrics and Gynecology

## 2016-07-14 ENCOUNTER — Ambulatory Visit (INDEPENDENT_AMBULATORY_CARE_PROVIDER_SITE_OTHER): Payer: BLUE CROSS/BLUE SHIELD | Admitting: Obstetrics and Gynecology

## 2016-07-14 VITALS — BP 130/82 | HR 92 | Wt 212.0 lb

## 2016-07-14 DIAGNOSIS — Z09 Encounter for follow-up examination after completed treatment for conditions other than malignant neoplasm: Secondary | ICD-10-CM

## 2016-07-14 DIAGNOSIS — Z9889 Other specified postprocedural states: Secondary | ICD-10-CM

## 2016-07-14 NOTE — Progress Notes (Addendum)
   Subjective:  Alexis Montgomery is a 26 y.o. female now 1 weeks status post Suction D&C for incomplete abortionShe desires to begin attempts to conceive after her next menses   Review of Systems Negative   Diet:   normal   Bowel movements : normal.  The patient is not having any pain.  Objective:  BP 130/82 (BP Location: Right Arm, Patient Position: Sitting, Cuff Size: Normal)   Pulse 92   Wt 212 lb (96.2 kg)   BMI 37.55 kg/m  General:Well developed, well nourished.  No acute distress. Abdomen: Bowel sounds normal, soft, non-tender. Pelvic Exam: Not indicated  Incision(s):   none     Assessment:  Post-Op 1 weeks s/p Suction D&C for incomplete abortion.  Contraception management not desired Advised to continue prenatal vitamins Doing well postoperatively.   Plan:  1.Wound care discussed  2. . current medications. 3. Activity restrictions: none 4. return to work: not applicable. 5. Follow up PRN.   By signing my name below, I, Izna Ahmed, attest that this documentation has been prepared under the direction and in the presence of Tilda BurrowJohn V. Ngoc Daughtridge, MD. Electronically Signed: Redge GainerIzna Ahmed, ED Scribe. 07/14/16. 4:12 PM.  I personally performed the services described in this documentation, which was SCRIBED in my presence. The recorded information has been reviewed and considered accurate. It has been edited as necessary during review. Tilda BurrowFERGUSON,Liza Czerwinski V, MD

## 2016-07-21 ENCOUNTER — Encounter: Payer: Self-pay | Admitting: Obstetrics and Gynecology

## 2016-07-23 ENCOUNTER — Encounter: Payer: Self-pay | Admitting: Women's Health

## 2016-08-11 DIAGNOSIS — Z6837 Body mass index (BMI) 37.0-37.9, adult: Secondary | ICD-10-CM | POA: Diagnosis not present

## 2016-08-11 DIAGNOSIS — N760 Acute vaginitis: Secondary | ICD-10-CM | POA: Diagnosis not present

## 2016-08-11 DIAGNOSIS — F4321 Adjustment disorder with depressed mood: Secondary | ICD-10-CM | POA: Diagnosis not present

## 2016-09-15 DIAGNOSIS — L732 Hidradenitis suppurativa: Secondary | ICD-10-CM | POA: Diagnosis not present

## 2016-09-15 DIAGNOSIS — R030 Elevated blood-pressure reading, without diagnosis of hypertension: Secondary | ICD-10-CM | POA: Diagnosis not present

## 2016-09-15 DIAGNOSIS — R21 Rash and other nonspecific skin eruption: Secondary | ICD-10-CM | POA: Diagnosis not present

## 2016-09-17 DIAGNOSIS — L732 Hidradenitis suppurativa: Secondary | ICD-10-CM | POA: Diagnosis not present

## 2016-09-17 DIAGNOSIS — Z6837 Body mass index (BMI) 37.0-37.9, adult: Secondary | ICD-10-CM | POA: Diagnosis not present

## 2016-10-15 ENCOUNTER — Encounter: Payer: Self-pay | Admitting: Women's Health

## 2016-11-13 ENCOUNTER — Emergency Department (HOSPITAL_COMMUNITY): Payer: BLUE CROSS/BLUE SHIELD

## 2016-11-13 ENCOUNTER — Encounter (HOSPITAL_COMMUNITY): Payer: Self-pay | Admitting: *Deleted

## 2016-11-13 ENCOUNTER — Emergency Department (HOSPITAL_COMMUNITY)
Admission: EM | Admit: 2016-11-13 | Discharge: 2016-11-13 | Disposition: A | Discharge status: Home / Self Care | Payer: BLUE CROSS/BLUE SHIELD | Attending: Emergency Medicine | Admitting: Emergency Medicine

## 2016-11-13 ENCOUNTER — Other Ambulatory Visit: Payer: Self-pay

## 2016-11-13 DIAGNOSIS — M791 Myalgia, unspecified site: Secondary | ICD-10-CM | POA: Diagnosis not present

## 2016-11-13 DIAGNOSIS — I10 Essential (primary) hypertension: Secondary | ICD-10-CM | POA: Diagnosis not present

## 2016-11-13 DIAGNOSIS — Y999 Unspecified external cause status: Secondary | ICD-10-CM | POA: Insufficient documentation

## 2016-11-13 DIAGNOSIS — S39012A Strain of muscle, fascia and tendon of lower back, initial encounter: Secondary | ICD-10-CM | POA: Insufficient documentation

## 2016-11-13 DIAGNOSIS — Y939 Activity, unspecified: Secondary | ICD-10-CM | POA: Insufficient documentation

## 2016-11-13 DIAGNOSIS — Y929 Unspecified place or not applicable: Secondary | ICD-10-CM | POA: Diagnosis not present

## 2016-11-13 DIAGNOSIS — Z79899 Other long term (current) drug therapy: Secondary | ICD-10-CM | POA: Diagnosis not present

## 2016-11-13 DIAGNOSIS — M25512 Pain in left shoulder: Secondary | ICD-10-CM | POA: Diagnosis not present

## 2016-11-13 DIAGNOSIS — M7918 Myalgia, other site: Secondary | ICD-10-CM

## 2016-11-13 DIAGNOSIS — M545 Low back pain: Secondary | ICD-10-CM | POA: Diagnosis not present

## 2016-11-13 LAB — POC URINE PREG, ED: Preg Test, Ur: NEGATIVE

## 2016-11-13 MED ORDER — CYCLOBENZAPRINE HCL 5 MG PO TABS: 5.0000 mg | ORAL_TABLET | Freq: Three times a day (TID) | ORAL | 0 refills | Status: DC | PRN

## 2016-11-13 MED ORDER — IBUPROFEN 600 MG PO TABS: 600.0000 mg | ORAL_TABLET | Freq: Four times a day (QID) | ORAL | 0 refills | Status: DC | PRN
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
## 2017-01-22 DIAGNOSIS — Z23 Encounter for immunization: Secondary | ICD-10-CM | POA: Diagnosis not present

## 2017-02-02 DIAGNOSIS — L732 Hidradenitis suppurativa: Secondary | ICD-10-CM | POA: Diagnosis not present

## 2017-02-05 ENCOUNTER — Ambulatory Visit: Payer: BLUE CROSS/BLUE SHIELD | Admitting: Women's Health

## 2017-02-05 ENCOUNTER — Encounter: Payer: Self-pay | Admitting: Women's Health

## 2017-02-05 VITALS — BP 136/80 | HR 84 | Ht 62.0 in | Wt 223.2 lb

## 2017-02-05 DIAGNOSIS — E669 Obesity, unspecified: Secondary | ICD-10-CM

## 2017-02-05 DIAGNOSIS — N913 Primary oligomenorrhea: Secondary | ICD-10-CM | POA: Insufficient documentation

## 2017-02-05 DIAGNOSIS — Z319 Encounter for procreative management, unspecified: Secondary | ICD-10-CM | POA: Diagnosis not present

## 2017-02-05 DIAGNOSIS — N926 Irregular menstruation, unspecified: Secondary | ICD-10-CM | POA: Diagnosis not present

## 2017-02-05 LAB — POCT URINE PREGNANCY: Preg Test, Ur: NEGATIVE

## 2017-02-05 MED ORDER — CLOMIPHENE CITRATE 50 MG PO TABS: 50.0000 mg | ORAL_TABLET | Freq: Every day | ORAL | 6 refills | Status: DC

## 2017-02-05 MED ORDER — MEDROXYPROGESTERONE ACETATE 10 MG PO TABS: 10.0000 mg | ORAL_TABLET | Freq: Every day | ORAL | 6 refills | Status: DC

## 2017-02-05 NOTE — Progress Notes (Signed)
GYN VISIT Patient name: Alexis Montgomery MRN [id number]  Date of birth: September 23, 1990 Chief Complaint:   Follow-up (wants to get pregnant)  History of Present Illness:   Alexis Montgomery is a 27 y.o. [phone] Caucasian female being seen today for report of wanting to get pregnant. States she had to take clomid w/ 1st pregnancy. Conceived on her own twice in 2018, had blighted ovum April 2018, and missed AB w/ [facility] June 2018. Had always had very irregular periods w/ only 2-3 periods/year. Has never been dx w/ PCOS. Denies acne, hirsutism, hair loss. No period since [facility] in July until 12/4- lasted 2 weeks. H/O CHTN- stopped Labetalol b/c was making her bp drop too low. Hidradenitis, no longer taking Humira, is trying Biotin as a friend recommended it, has only been on it x 2wks. Has been taking an energy pill to increase her energy/help her lose weight. Not taking pnv.   Patient's last menstrual period was 12/09/2016 (exact date). The current method of family planning is none. Last pap 06/11/15. Results were:  normal Review of Systems:   Pertinent items are noted in HPI Denies fever/chills, dizziness, headaches, visual disturbances, fatigue, shortness of breath, chest pain, abdominal pain, vomiting, abnormal vaginal discharge/itching/odor/irritation, problems with periods, bowel movements, urination, or intercourse unless otherwise stated above.  Pertinent History Reviewed:  Reviewed past medical,surgical, social, obstetrical and family history.  Reviewed problem list, medications and allergies. Physical Assessment:   Vitals:   02/05/17 1038  BP: 136/80  Pulse: 84  Weight: 223 lb 3.2 oz (101.2 kg)  Height: 5\' 2"  (1.575 m)  Body mass index is 40.82 kg/m.       Physical Examination:   General appearance: alert, well appearing, and in no distress  Mental status: alert, oriented to person, place, and time  Skin: warm & dry   Cardiovascular: normal heart rate noted  Respiratory: normal respiratory  effort, no distress  Abdomen: soft, non-tender   Pelvic: examination not indicated  Extremities: no edema   Results for orders placed or performed in visit on 02/05/17 (from the past 24 hour(s))  POCT urine pregnancy   Collection Time: 02/05/17 10:47 AM  Result Value Ref Range   Preg Test, Ur Negative Negative    Assessment & Plan:  1) Oligomenorrhea  2) Desires pregnancy> discussed coc's x 3 months, then trying for pregnancy- pt does not want to do this, states she is terrible at taking daily pills and would forget- wants to go straight to clomid. Will try provera 10mg  daily x 10d, then if bleeds-clomid 50mg  days 3-7. Send mychart message w/ when bleeding starts, and will come on day 21 for progesterone. Start pnv. Gave printed fertility tips.   3) Obesity> BMI 40, recommended weight loss to help w/ chances of conceiving. Decrease carbs/calories, increase exercise/activity  Meds:  Meds ordered this encounter  Medications  . medroxyPROGESTERone (PROVERA) 10 MG tablet    Sig: Take 1 tablet (10 mg total) by mouth daily. X 10 days    Dispense:  10 tablet    Refill:  6    Order Specific Question:   Supervising Provider    Answer:   Despina Hidden, LUTHER H [[id number]]  . clomiPHENE (CLOMID) 50 MG tablet    Sig: Take 1 tablet (50 mg total) by mouth daily. On days 3-7    Dispense:  5 tablet    Refill:  6    Order Specific Question:   Supervising Provider    Answer:  EURE, LUTHER H [[id number]]    Orders Placed This Encounter  Procedures  . POCT urine pregnancy    Return in about 3 months (around 05/05/2017).  Marge DuncansBooker, Marquita Lias Randall CNM, [facility]NP-BC 02/05/2017 1:11 PM

## 2017-02-05 NOTE — Patient Instructions (Addendum)
Take provera daily for 10 days then stop, you should start bleeding within a few days of stopping. The day you start bleeding is Day 1. Start clomid on Day 3-7. Come in on Day 21 for labwork to see if you ovulated.  Send a Clinical cytogeneticistmychart message the day your bleeding starts that way I know when to schedule your labs on Day 21  If you are trying to get pregnant:   Have sex every other day on days 7-24 of your cycle (Day 1 is the 1st day of your period)  Pee before sex  Lay with your hips elevated on pillows for 20-7630mins after sex  Do not smoke or drink alcohol  Lose weight if you are overweight  Take a prenatal vitamin with at least 400mcg of folic acid  Decrease stress in your life  For Him:   Wear boxers instead of briefs  Avoid hot baths/jacuzzi  Vit C supplement  Do not smoke or drink alcohol  Lose weight if you are overweight  Keep in mind that it can be normal to take up to a year to become pregnant 80-90% of women will become pregnant within 1 year of trying

## 2017-02-16 ENCOUNTER — Encounter: Payer: Self-pay | Admitting: Women's Health

## 2017-02-19 ENCOUNTER — Other Ambulatory Visit: Payer: Self-pay | Admitting: Women's Health

## 2017-02-19 DIAGNOSIS — N926 Irregular menstruation, unspecified: Secondary | ICD-10-CM

## 2017-03-02 ENCOUNTER — Encounter: Payer: Self-pay | Admitting: Women's Health

## 2017-03-11 ENCOUNTER — Other Ambulatory Visit: Payer: Self-pay

## 2017-03-13 DIAGNOSIS — Z6841 Body Mass Index (BMI) 40.0 and over, adult: Secondary | ICD-10-CM | POA: Diagnosis not present

## 2017-03-13 DIAGNOSIS — J00 Acute nasopharyngitis [common cold]: Secondary | ICD-10-CM | POA: Diagnosis not present

## 2017-03-23 ENCOUNTER — Encounter (HOSPITAL_COMMUNITY): Payer: Self-pay | Admitting: Emergency Medicine

## 2017-03-23 ENCOUNTER — Telehealth: Payer: Self-pay | Admitting: *Deleted

## 2017-03-23 ENCOUNTER — Encounter: Payer: Self-pay | Admitting: Women's Health

## 2017-03-23 ENCOUNTER — Emergency Department (HOSPITAL_COMMUNITY): Payer: BLUE CROSS/BLUE SHIELD

## 2017-03-23 ENCOUNTER — Emergency Department (HOSPITAL_COMMUNITY)
Admission: EM | Admit: 2017-03-23 | Discharge: 2017-03-23 | Disposition: A | Discharge status: Home / Self Care | Payer: BLUE CROSS/BLUE SHIELD | Attending: Emergency Medicine | Admitting: Emergency Medicine

## 2017-03-23 DIAGNOSIS — R102 Pelvic and perineal pain: Secondary | ICD-10-CM | POA: Diagnosis not present

## 2017-03-23 DIAGNOSIS — I10 Essential (primary) hypertension: Secondary | ICD-10-CM | POA: Diagnosis not present

## 2017-03-23 LAB — WET PREP, GENITAL
CLUE CELLS WET PREP: NONE SEEN
Sperm: NONE SEEN
Trich, Wet Prep: NONE SEEN
YEAST WET PREP: NONE SEEN

## 2017-03-23 LAB — URINALYSIS, ROUTINE W REFLEX MICROSCOPIC
BACTERIA UA: NONE SEEN
Bilirubin Urine: NEGATIVE
Glucose, UA: NEGATIVE mg/dL
HGB URINE DIPSTICK: NEGATIVE
Ketones, ur: NEGATIVE mg/dL
NITRITE: NEGATIVE
PROTEIN: NEGATIVE mg/dL
SPECIFIC GRAVITY, URINE: 1.019 (ref 1.005–1.030)
pH: 6 (ref 5.0–8.0)

## 2017-03-23 LAB — HCG, QUANTITATIVE, PREGNANCY

## 2017-03-23 MED ORDER — HYDROCODONE-ACETAMINOPHEN 5-325 MG PO TABS
1.0000 | ORAL_TABLET | Freq: Once | ORAL | Status: AC
  Administered 2017-03-23: 1 via ORAL
  Filled 2017-03-23: qty 1

## 2017-03-23 MED ORDER — HYDROCODONE-ACETAMINOPHEN 5-325 MG PO TABS: 1.0000 | ORAL_TABLET | Freq: Four times a day (QID) | ORAL | 0 refills | Status: DC | PRN

## 2017-03-23 NOTE — ED Provider Notes (Signed)
Emergency Department Provider Note   I have reviewed the triage vital signs and the nursing notes.   HISTORY  Chief Complaint Pelvic Pain   HPI Alexis Montgomery is a 27 y.o. female with a history of hypertension, anemia who is here today secondary to pelvic pain.  Patient states that she has been taking progesterone and Clomid to try to get pregnant and over the auscultation had excruciating pelvic pain that radiates to both flanks that feels like someone is tearing her uterus out.  No vaginal bleeding or discharge.  No urinary symptoms.  Nothing makes it better or worse but it is progressively worsening.  Her last menstrual cycle was started on February 14 and she has not started one yet the cycle.  No fevers, nausea, vomiting, diarrhea or constipation. No other associated or modifying symptoms.    Past Medical History:  Diagnosis Date  . Anemia   . Benign essential HTN 10/09/2014  . Hidradenitis suppurativa 10/09/2014  . Menorrhagia with irregular cycle 06/11/2015  . Morbid obesity (HCC) 10/09/2014  . MRSA infection 10/09/2014  . Pregnancy induced hypertension     Patient Active Problem List   Diagnosis Date Noted  . 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 Blighted ovum 03/31/2016  . H/O severe pre-e 03/03/2016  . Previous cesarean section 03/03/2016  . History of preterm delivery 03/03/2016  . Menorrhagia with irregular cycle 06/11/2015  . Hidradenitis suppurativa 10/09/2014  . MRSA infection 10/09/2014  . Benign essential HTN 10/09/2014  . Morbid obesity (HCC) 10/09/2014  . Sepsis (HCC) 06/15/2014  . Abscess of abdominal wall 06/15/2014    Past Surgical History:  Procedure Laterality Date  . CESAREAN SECTION N/A 04/23/2012   Procedure: CESAREAN SECTION;  Surgeon: Tereso Newcomer, MD;  Location: [facility];  Service: Obstetrics;  Laterality: N/A;  . DILATION AND CURETTAGE OF UTERUS N/A 07/07/2016   Procedure: SUCTION DILATATION AND CURETTAGE;  Surgeon: Tilda Burrow, MD;   Location: [facility];  Service: Gynecology;  Laterality: N/A;    Current Outpatient Rx  . Order #: [id number] Class: Print    Allergies Patient has no known allergies.  Family History  Problem Relation Age of Onset  . HIV Mother   . Cancer Mother   . Heart Problems Paternal Grandfather   . Diabetes Paternal Uncle   . Other Sister        hidradenitis suppurativa    Social History Social History   Tobacco Use  . Smoking status: Never Smoker  . Smokeless tobacco: Never Used  Substance Use Topics  . Alcohol use: No    Alcohol/week: 0.0 oz  . Drug use: No    Review of Systems  All other systems negative except as documented in the HPI. All pertinent positives and negatives as reviewed in the HPI. ____________________________________________   PHYSICAL EXAM:  VITAL SIGNS: ED Triage Vitals [03/23/17 1456]  Enc Vitals Group     BP (!) 156/103     Pulse Rate 100     Resp 15     Temp 98.6 F (37 C)     Temp Source Oral     SpO2 100 %     Weight 230 lb (104.3 kg)     Height 5\' 3"  (1.6 m)    Constitutional: Alert and oriented. Well appearing and in no acute distress. Eyes: Conjunctivae are normal. PERRL. EOMI. Head: Atraumatic. Nose: No congestion/rhinnorhea. Mouth/Throat: Mucous membranes are moist.  Oropharynx non-erythematous. Neck: No stridor.  No meningeal signs.  Cardiovascular: Normal rate, regular rhythm. Good peripheral circulation. Grossly normal heart sounds.   Respiratory: Normal respiratory effort.  No retractions. Lungs CTAB. Gastrointestinal: Soft and nontender. No distention.  GU: diffuse pain with bimanual exam and general pelvic exam but no mass or point ttp. Musculoskeletal: No lower extremity tenderness nor edema. No gross deformities of extremities. Neurologic:  Normal speech and language. No gross focal neurologic deficits are appreciated.  Skin:  Skin is warm, dry and intact. No rash noted.   ____________________________________________     LABS (all labs ordered are listed, but only abnormal results are displayed)  Labs Reviewed  WET PREP, GENITAL - Abnormal; Notable for the following components:      Result Value   WBC, Wet Prep HPF POC FEW (*)    All other components within normal limits  URINALYSIS, ROUTINE W REFLEX MICROSCOPIC - Abnormal; Notable for the following components:   Color, Urine AMBER (*)    APPearance CLOUDY (*)    Leukocytes, UA TRACE (*)    Squamous Epithelial / LPF 6-30 (*)    All other components within normal limits  HCG, QUANTITATIVE, PREGNANCY  GC/CHLAMYDIA PROBE AMP ([place]) NOT AT [facility]   ____________________________________________  RADIOLOGY  US Transvaginal Non-ob  Result Date: 03/23/2017 CLINICAL DATA:  Pelvic pain x3 days EXAM: TRANSABDOMINAL AND TRANSVAGINAL ULTRASOUND OF PELVIS DOPPLER ULTRASOUND OF OVARIES TECHNIQUE: Both transabdominal and transvaginal ultrasound examinations of the pelvis were performed. Transabdominal technique was performed for global imaging of the pelvis including uterus, ovaries, adnexal regions, and pelvic cul-de-sac. It was necessary to proceed with endovaginal exam following the transabdominal exam to visualize the uterus, endometrium and ovaries. Color and duplex Doppler ultrasound was utilized to evaluate blood flow to the ovaries. COMPARISON:  07/06/2016 FINDINGS: Uterus Measurements: 7.9 x 4.2 x 4.3 cm. No fibroids or other mass visualized. Endometrium Thickness: 7.8 mm.  No focal abnormality visualized. Right ovary Measurements: 3 x 3.2 x 3.1 cm.  Normal appearance/no adnexal mass. Left ovary Measurements: 4 x 3.1 x 3.1 cm.  Normal appearance/no adnexal mass. Pulsed Doppler evaluation of both ovaries demonstrates normal low-resistance arterial and venous waveforms. Other findings No abnormal free fluid. IMPRESSION: Unremarkable pelvic ultrasound. No uterine nor adnexal mass. No evidence of ovarian torsion. Electronically Signed   By: Tollie Eth M.D.   On:  03/23/2017 19:10   US Pelvis Complete  Result Date: 03/23/2017 CLINICAL DATA:  Pelvic pain x3 days EXAM: TRANSABDOMINAL AND TRANSVAGINAL ULTRASOUND OF PELVIS DOPPLER ULTRASOUND OF OVARIES TECHNIQUE: Both transabdominal and transvaginal ultrasound examinations of the pelvis were performed. Transabdominal technique was performed for global imaging of the pelvis including uterus, ovaries, adnexal regions, and pelvic cul-de-sac. It was necessary to proceed with endovaginal exam following the transabdominal exam to visualize the uterus, endometrium and ovaries. Color and duplex Doppler ultrasound was utilized to evaluate blood flow to the ovaries. COMPARISON:  07/06/2016 FINDINGS: Uterus Measurements: 7.9 x 4.2 x 4.3 cm. No fibroids or other mass visualized. Endometrium Thickness: 7.8 mm.  No focal abnormality visualized. Right ovary Measurements: 3 x 3.2 x 3.1 cm.  Normal appearance/no adnexal mass. Left ovary Measurements: 4 x 3.1 x 3.1 cm.  Normal appearance/no adnexal mass. Pulsed Doppler evaluation of both ovaries demonstrates normal low-resistance arterial and venous waveforms. Other findings No abnormal free fluid. IMPRESSION: Unremarkable pelvic ultrasound. No uterine nor adnexal mass. No evidence of ovarian torsion. Electronically Signed   By: Tollie Eth M.D.   On: 03/23/2017 19:10   [place] Art/ven Flow Abd Pelv  Doppler  Result Date: 03/23/2017 CLINICAL DATA:  Pelvic pain x3 days EXAM: TRANSABDOMINAL AND TRANSVAGINAL ULTRASOUND OF PELVIS DOPPLER ULTRASOUND OF OVARIES TECHNIQUE: Both transabdominal and transvaginal ultrasound examinations of the pelvis were performed. Transabdominal technique was performed for global imaging of the pelvis including uterus, ovaries, adnexal regions, and pelvic cul-de-sac. It was necessary to proceed with endovaginal exam following the transabdominal exam to visualize the uterus, endometrium and ovaries. Color and duplex Doppler ultrasound was utilized to evaluate blood flow  to the ovaries. COMPARISON:  07/06/2016 FINDINGS: Uterus Measurements: 7.9 x 4.2 x 4.3 cm. No fibroids or other mass visualized. Endometrium Thickness: 7.8 mm.  No focal abnormality visualized. Right ovary Measurements: 3 x 3.2 x 3.1 cm.  Normal appearance/no adnexal mass. Left ovary Measurements: 4 x 3.1 x 3.1 cm.  Normal appearance/no adnexal mass. Pulsed Doppler evaluation of both ovaries demonstrates normal low-resistance arterial and venous waveforms. Other findings No abnormal free fluid. IMPRESSION: Unremarkable pelvic ultrasound. No uterine nor adnexal mass. No evidence of ovarian torsion. Electronically Signed   By: Tollie Ethavid  Kwon M.D.   On: 03/23/2017 19:10    ____________________________________________   PROCEDURES  Procedure(s) performed:   Procedures   ____________________________________________   INITIAL IMPRESSION / ASSESSMENT AND PLAN / ED COURSE  She has been on medications to try to get a pregnancy concern for possible ovarian cyst and torsion.  Could also be retained products of her menstrual cycle.  However with a question of torsion we will get an ultrasound for further work-up.  US negative.  Pain improved.  Will have her follow-up with her gynecologist however this could just be related to a menstrual cramp that little bit worse than normal because of her being on hormones.  Pertinent labs & imaging results that were available during my care of the patient were reviewed by me and considered in my medical decision making (see chart for details).  ____________________________________________  FINAL CLINICAL IMPRESSION(S) / ED DIAGNOSES  Final diagnoses:  Pelvic pain in female     MEDICATIONS GIVEN DURING THIS VISIT:  Medications  HYDROcodone-acetaminophen (NORCO/VICODIN) 5-325 MG per tablet 1 tablet (1 tablet Oral Given 03/23/17 1754)     NEW OUTPATIENT MEDICATIONS STARTED DURING THIS VISIT:  New Prescriptions   HYDROCODONE-ACETAMINOPHEN (NORCO/VICODIN)  5-325 MG TABLET    Take 1 tablet by mouth every 6 (six) hours as needed for severe pain.    Note:  This note was prepared with assistance of Dragon voice recognition software. Occasional wrong-word or sound-a-like substitutions may have occurred due to the inherent limitations of voice recognition software.   Marily MemosMesner, Jahna Liebert, MD 03/23/17 2042

## 2017-03-23 NOTE — ED Notes (Signed)
Ultrasound is coming in. EDP aware.

## 2017-03-23 NOTE — ED Triage Notes (Signed)
Pt reports pelvic pain radiating around to back since Saturday.  Denies vaginal discharge.  Pt reports her period is 4 days late and has been trying using Clomid and progesterone.  HPT negative.

## 2017-03-24 DIAGNOSIS — Z6841 Body Mass Index (BMI) 40.0 and over, adult: Secondary | ICD-10-CM | POA: Insufficient documentation

## 2017-03-24 DIAGNOSIS — I10 Essential (primary) hypertension: Secondary | ICD-10-CM | POA: Diagnosis not present

## 2017-03-24 DIAGNOSIS — L732 Hidradenitis suppurativa: Secondary | ICD-10-CM | POA: Diagnosis not present

## 2017-03-24 DIAGNOSIS — M545 Low back pain: Secondary | ICD-10-CM | POA: Diagnosis not present

## 2017-03-24 LAB — GC/CHLAMYDIA PROBE AMP (~~LOC~~) NOT AT ARMC
CHLAMYDIA, DNA PROBE: NEGATIVE
NEISSERIA GONORRHEA: NEGATIVE

## 2017-03-30 DIAGNOSIS — G4719 Other hypersomnia: Secondary | ICD-10-CM | POA: Diagnosis not present

## 2017-03-30 DIAGNOSIS — R0683 Snoring: Secondary | ICD-10-CM | POA: Diagnosis not present

## 2017-03-30 DIAGNOSIS — Z6841 Body Mass Index (BMI) 40.0 and over, adult: Secondary | ICD-10-CM | POA: Diagnosis not present

## 2017-04-02 DIAGNOSIS — Z6841 Body Mass Index (BMI) 40.0 and over, adult: Secondary | ICD-10-CM | POA: Diagnosis not present

## 2017-04-02 DIAGNOSIS — Z136 Encounter for screening for cardiovascular disorders: Secondary | ICD-10-CM | POA: Diagnosis not present

## 2017-04-02 DIAGNOSIS — I10 Essential (primary) hypertension: Secondary | ICD-10-CM | POA: Diagnosis not present

## 2017-04-04 DIAGNOSIS — G4710 Hypersomnia, unspecified: Secondary | ICD-10-CM | POA: Diagnosis not present

## 2017-04-06 DIAGNOSIS — Z7189 Other specified counseling: Secondary | ICD-10-CM | POA: Diagnosis not present

## 2017-04-06 DIAGNOSIS — Z6841 Body Mass Index (BMI) 40.0 and over, adult: Secondary | ICD-10-CM | POA: Diagnosis not present

## 2017-04-06 DIAGNOSIS — F54 Psychological and behavioral factors associated with disorders or diseases classified elsewhere: Secondary | ICD-10-CM | POA: Diagnosis not present

## 2017-04-20 DIAGNOSIS — Z6841 Body Mass Index (BMI) 40.0 and over, adult: Secondary | ICD-10-CM | POA: Diagnosis not present

## 2017-04-20 DIAGNOSIS — Z0000 Encounter for general adult medical examination without abnormal findings: Secondary | ICD-10-CM | POA: Diagnosis not present

## 2017-04-30 ENCOUNTER — Ambulatory Visit: Payer: BLUE CROSS/BLUE SHIELD | Admitting: Women's Health

## 2017-05-05 DIAGNOSIS — Z6841 Body Mass Index (BMI) 40.0 and over, adult: Secondary | ICD-10-CM | POA: Diagnosis not present

## 2017-05-05 DIAGNOSIS — Z713 Dietary counseling and surveillance: Secondary | ICD-10-CM | POA: Diagnosis not present

## 2017-05-08 DIAGNOSIS — R7303 Prediabetes: Secondary | ICD-10-CM | POA: Diagnosis not present

## 2017-05-08 DIAGNOSIS — Z6841 Body Mass Index (BMI) 40.0 and over, adult: Secondary | ICD-10-CM | POA: Diagnosis not present

## 2017-05-08 DIAGNOSIS — E611 Iron deficiency: Secondary | ICD-10-CM | POA: Insufficient documentation

## 2017-05-08 DIAGNOSIS — I10 Essential (primary) hypertension: Secondary | ICD-10-CM | POA: Diagnosis not present

## 2017-05-18 DIAGNOSIS — B373 Candidiasis of vulva and vagina: Secondary | ICD-10-CM | POA: Diagnosis not present

## 2017-05-18 DIAGNOSIS — Z681 Body mass index (BMI) 19 or less, adult: Secondary | ICD-10-CM | POA: Diagnosis not present

## 2017-05-20 DIAGNOSIS — E785 Hyperlipidemia, unspecified: Secondary | ICD-10-CM | POA: Diagnosis not present

## 2017-05-20 DIAGNOSIS — K2970 Gastritis, unspecified, without bleeding: Secondary | ICD-10-CM | POA: Diagnosis not present

## 2017-05-20 DIAGNOSIS — B9681 Helicobacter pylori [H. pylori] as the cause of diseases classified elsewhere: Secondary | ICD-10-CM | POA: Diagnosis not present

## 2017-05-20 DIAGNOSIS — K219 Gastro-esophageal reflux disease without esophagitis: Secondary | ICD-10-CM | POA: Diagnosis not present

## 2017-05-20 DIAGNOSIS — M1990 Unspecified osteoarthritis, unspecified site: Secondary | ICD-10-CM | POA: Diagnosis not present

## 2017-05-20 DIAGNOSIS — K2950 Unspecified chronic gastritis without bleeding: Secondary | ICD-10-CM | POA: Diagnosis not present

## 2017-05-20 DIAGNOSIS — Z79899 Other long term (current) drug therapy: Secondary | ICD-10-CM | POA: Diagnosis not present

## 2017-05-20 DIAGNOSIS — G4733 Obstructive sleep apnea (adult) (pediatric): Secondary | ICD-10-CM | POA: Diagnosis not present

## 2017-05-20 DIAGNOSIS — I10 Essential (primary) hypertension: Secondary | ICD-10-CM | POA: Diagnosis not present

## 2017-05-20 DIAGNOSIS — Z6841 Body Mass Index (BMI) 40.0 and over, adult: Secondary | ICD-10-CM | POA: Diagnosis not present

## 2017-05-26 DIAGNOSIS — Z01818 Encounter for other preprocedural examination: Secondary | ICD-10-CM | POA: Diagnosis not present

## 2017-05-27 DIAGNOSIS — Z713 Dietary counseling and surveillance: Secondary | ICD-10-CM | POA: Diagnosis not present

## 2017-05-27 DIAGNOSIS — Z6841 Body Mass Index (BMI) 40.0 and over, adult: Secondary | ICD-10-CM | POA: Diagnosis not present

## 2017-06-08 DIAGNOSIS — I10 Essential (primary) hypertension: Secondary | ICD-10-CM | POA: Diagnosis not present

## 2017-06-08 DIAGNOSIS — M549 Dorsalgia, unspecified: Secondary | ICD-10-CM | POA: Diagnosis not present

## 2017-06-08 DIAGNOSIS — R7303 Prediabetes: Secondary | ICD-10-CM | POA: Diagnosis not present

## 2017-06-08 DIAGNOSIS — Z6839 Body mass index (BMI) 39.0-39.9, adult: Secondary | ICD-10-CM | POA: Diagnosis not present

## 2017-06-08 DIAGNOSIS — G8929 Other chronic pain: Secondary | ICD-10-CM | POA: Diagnosis not present

## 2017-06-08 DIAGNOSIS — E559 Vitamin D deficiency, unspecified: Secondary | ICD-10-CM | POA: Diagnosis not present

## 2017-06-08 DIAGNOSIS — Z6841 Body Mass Index (BMI) 40.0 and over, adult: Secondary | ICD-10-CM | POA: Diagnosis not present

## 2017-06-08 HISTORY — PX: LAPAROSCOPIC ROUX-EN-Y GASTRIC BYPASS WITH UPPER ENDOSCOPY AND REMOVAL OF LAP BAND: SHX6505

## 2017-06-25 DIAGNOSIS — Z713 Dietary counseling and surveillance: Secondary | ICD-10-CM | POA: Diagnosis not present

## 2017-07-13 DIAGNOSIS — K912 Postsurgical malabsorption, not elsewhere classified: Secondary | ICD-10-CM | POA: Insufficient documentation

## 2017-07-16 DIAGNOSIS — R110 Nausea: Secondary | ICD-10-CM | POA: Diagnosis not present

## 2017-07-16 DIAGNOSIS — N201 Calculus of ureter: Secondary | ICD-10-CM | POA: Diagnosis not present

## 2017-07-16 DIAGNOSIS — R1011 Right upper quadrant pain: Secondary | ICD-10-CM | POA: Diagnosis not present

## 2017-07-16 DIAGNOSIS — N132 Hydronephrosis with renal and ureteral calculous obstruction: Secondary | ICD-10-CM | POA: Diagnosis not present

## 2017-07-16 DIAGNOSIS — Z9884 Bariatric surgery status: Secondary | ICD-10-CM | POA: Diagnosis not present

## 2017-07-16 DIAGNOSIS — R197 Diarrhea, unspecified: Secondary | ICD-10-CM | POA: Diagnosis not present

## 2017-07-16 DIAGNOSIS — I10 Essential (primary) hypertension: Secondary | ICD-10-CM | POA: Diagnosis not present

## 2017-07-16 DIAGNOSIS — K8020 Calculus of gallbladder without cholecystitis without obstruction: Secondary | ICD-10-CM | POA: Diagnosis not present

## 2017-07-16 DIAGNOSIS — K828 Other specified diseases of gallbladder: Secondary | ICD-10-CM | POA: Diagnosis not present

## 2017-07-16 DIAGNOSIS — R1031 Right lower quadrant pain: Secondary | ICD-10-CM | POA: Diagnosis not present

## 2017-07-21 DIAGNOSIS — N200 Calculus of kidney: Secondary | ICD-10-CM | POA: Insufficient documentation

## 2017-08-10 DIAGNOSIS — L0201 Cutaneous abscess of face: Secondary | ICD-10-CM | POA: Diagnosis not present

## 2017-08-10 DIAGNOSIS — R5383 Other fatigue: Secondary | ICD-10-CM | POA: Diagnosis not present

## 2017-08-10 DIAGNOSIS — Z6832 Body mass index (BMI) 32.0-32.9, adult: Secondary | ICD-10-CM | POA: Diagnosis not present

## 2017-10-14 DIAGNOSIS — M545 Low back pain: Secondary | ICD-10-CM | POA: Diagnosis not present

## 2017-10-14 DIAGNOSIS — Z6828 Body mass index (BMI) 28.0-28.9, adult: Secondary | ICD-10-CM | POA: Diagnosis not present

## 2017-10-14 DIAGNOSIS — L304 Erythema intertrigo: Secondary | ICD-10-CM | POA: Diagnosis not present

## 2017-11-02 ENCOUNTER — Telehealth: Payer: Self-pay | Admitting: Adult Health

## 2017-11-02 ENCOUNTER — Telehealth: Payer: Self-pay | Admitting: *Deleted

## 2017-11-02 DIAGNOSIS — Z3490 Encounter for supervision of normal pregnancy, unspecified, unspecified trimester: Secondary | ICD-10-CM

## 2017-11-02 NOTE — Telephone Encounter (Signed)
Get labs labs today

## 2017-11-02 NOTE — Telephone Encounter (Signed)
Has weight loss surgery 6/3 and has lost about 70 lbs and got pregnant, was not trying, will check QHCG and progesterone  Keep appt

## 2017-11-02 NOTE — Telephone Encounter (Signed)
Pt called and wanted to make appt for a preg test, had home test positive, Made appt for Monday the 4th of Nov but she said that she had some missed ab last year and that we normally needed to do quant labs on her , Please advise

## 2017-11-03 ENCOUNTER — Other Ambulatory Visit: Payer: Self-pay | Admitting: Adult Health

## 2017-11-03 DIAGNOSIS — Z3490 Encounter for supervision of normal pregnancy, unspecified, unspecified trimester: Secondary | ICD-10-CM

## 2017-11-03 LAB — PROGESTERONE: PROGESTERONE: 8.8 ng/mL

## 2017-11-03 LAB — BETA HCG QUANT (REF LAB): HCG QUANT: 64 m[IU]/mL

## 2017-11-03 MED ORDER — PROGESTERONE MICRONIZED 200 MG PO CAPS: ORAL_CAPSULE | ORAL | 3 refills | Status: DC

## 2017-11-03 NOTE — Progress Notes (Signed)
Progesterone level 8.8 will supplement

## 2017-11-04 DIAGNOSIS — Z3490 Encounter for supervision of normal pregnancy, unspecified, unspecified trimester: Secondary | ICD-10-CM | POA: Diagnosis not present

## 2017-11-05 LAB — BETA HCG QUANT (REF LAB): HCG QUANT: 217 m[IU]/mL

## 2017-11-09 ENCOUNTER — Encounter: Payer: Self-pay | Admitting: Adult Health

## 2017-11-09 ENCOUNTER — Ambulatory Visit (INDEPENDENT_AMBULATORY_CARE_PROVIDER_SITE_OTHER): Payer: BLUE CROSS/BLUE SHIELD | Admitting: Adult Health

## 2017-11-09 VITALS — BP 133/82 | HR 86 | Ht 63.0 in | Wt 156.5 lb

## 2017-11-09 DIAGNOSIS — Z3A01 Less than 8 weeks gestation of pregnancy: Secondary | ICD-10-CM

## 2017-11-09 DIAGNOSIS — O3680X0 Pregnancy with inconclusive fetal viability, not applicable or unspecified: Secondary | ICD-10-CM | POA: Diagnosis not present

## 2017-11-09 DIAGNOSIS — Z9884 Bariatric surgery status: Secondary | ICD-10-CM | POA: Diagnosis not present

## 2017-11-09 NOTE — Patient Instructions (Signed)
First Trimester of Pregnancy The first trimester of pregnancy is from week 1 until the end of week 13 (months 1 through 3). A week after a sperm fertilizes an egg, the egg will implant on the wall of the uterus. This embryo will begin to develop into a baby. Genes from you and your partner will form the baby. The female genes will determine whether the baby will be a boy or a girl. At 6-8 weeks, the eyes and face will be formed, and the heartbeat can be seen on ultrasound. At the end of 12 weeks, all the baby's organs will be formed. Now that you are pregnant, you will want to do everything you can to have a healthy baby. Two of the most important things are to get good prenatal care and to follow your health care provider's instructions. Prenatal care is all the medical care you receive before the baby's birth. This care will help prevent, find, and treat any problems during the pregnancy and childbirth. Body changes during your first trimester Your body goes through many changes during pregnancy. The changes vary from woman to woman.  You may gain or lose a couple of pounds at first.  You may feel sick to your stomach (nauseous) and you may throw up (vomit). If the vomiting is uncontrollable, call your health care provider.  You may tire easily.  You may develop headaches that can be relieved by medicines. All medicines should be approved by your health care provider.  You may urinate more often. Painful urination may mean you have a bladder infection.  You may develop heartburn as a result of your pregnancy.  You may develop constipation because certain hormones are causing the muscles that push stool through your intestines to slow down.  You may develop hemorrhoids or swollen veins (varicose veins).  Your breasts may begin to grow larger and become tender. Your nipples may stick out more, and the tissue that surrounds them (areola) may become darker.  Your gums may bleed and may be  sensitive to brushing and flossing.  Dark spots or blotches (chloasma, mask of pregnancy) may develop on your face. This will likely fade after the baby is born.  Your menstrual periods will stop.  You may have a loss of appetite.  You may develop cravings for certain kinds of food.  You may have changes in your emotions from day to day, such as being excited to be pregnant or being concerned that something may go wrong with the pregnancy and baby.  You may have more vivid and strange dreams.  You may have changes in your hair. These can include thickening of your hair, rapid growth, and changes in texture. Some women also have hair loss during or after pregnancy, or hair that feels dry or thin. Your hair will most likely return to normal after your baby is born.  What to expect at prenatal visits During a routine prenatal visit:  You will be weighed to make sure you and the baby are growing normally.  Your blood pressure will be taken.  Your abdomen will be measured to track your baby's growth.  The fetal heartbeat will be listened to between weeks 10 and 14 of your pregnancy.  Test results from any previous visits will be discussed.  Your health care provider may ask you:  How you are feeling.  If you are feeling the baby move.  If you have had any abnormal symptoms, such as leaking fluid, bleeding, severe headaches,   or abdominal cramping.  If you are using any tobacco products, including cigarettes, chewing tobacco, and electronic cigarettes.  If you have any questions.  Other tests that may be performed during your first trimester include:  Blood tests to find your blood type and to check for the presence of any previous infections. The tests will also be used to check for low iron levels (anemia) and protein on red blood cells (Rh antibodies). Depending on your risk factors, or if you previously had diabetes during pregnancy, you may have tests to check for high blood  sugar that affects pregnant women (gestational diabetes).  Urine tests to check for infections, diabetes, or protein in the urine.  An ultrasound to confirm the proper growth and development of the baby.  Fetal screens for spinal cord problems (spina bifida) and Down syndrome.  HIV (human immunodeficiency virus) testing. Routine prenatal testing includes screening for HIV, unless you choose not to have this test.  You may need other tests to make sure you and the baby are doing well.  Follow these instructions at home: Medicines  Follow your health care provider's instructions regarding medicine use. Specific medicines may be either safe or unsafe to take during pregnancy.  Take a prenatal vitamin that contains at least 600 micrograms (mcg) of folic acid.  If you develop constipation, try taking a stool softener if your health care provider approves. Eating and drinking  Eat a balanced diet that includes fresh fruits and vegetables, whole grains, good sources of protein such as meat, eggs, or tofu, and low-fat dairy. Your health care provider will help you determine the amount of weight gain that is right for you.  Avoid raw meat and uncooked cheese. These carry germs that can cause birth defects in the baby.  Eating four or five small meals rather than three large meals a day may help relieve nausea and vomiting. If you start to feel nauseous, eating a few soda crackers can be helpful. Drinking liquids between meals, instead of during meals, also seems to help ease nausea and vomiting.  Limit foods that are high in fat and processed sugars, such as fried and sweet foods.  To prevent constipation: ? Eat foods that are high in fiber, such as fresh fruits and vegetables, whole grains, and beans. ? Drink enough fluid to keep your urine clear or pale yellow. Activity  Exercise only as directed by your health care provider. Most women can continue their usual exercise routine during  pregnancy. Try to exercise for 30 minutes at least 5 days a week. Exercising will help you: ? Control your weight. ? Stay in shape. ? Be prepared for labor and delivery.  Experiencing pain or cramping in the lower abdomen or lower back is a good sign that you should stop exercising. Check with your health care provider before continuing with normal exercises.  Try to avoid standing for long periods of time. Move your legs often if you must stand in one place for a long time.  Avoid heavy lifting.  Wear low-heeled shoes and practice good posture.  You may continue to have sex unless your health care provider tells you not to. Relieving pain and discomfort  Wear a good support bra to relieve breast tenderness.  Take warm sitz baths to soothe any pain or discomfort caused by hemorrhoids. Use hemorrhoid cream if your health care provider approves.  Rest with your legs elevated if you have leg cramps or low back pain.  If you develop   varicose veins in your legs, wear support hose. Elevate your feet for 15 minutes, 3-4 times a day. Limit salt in your diet. Prenatal care  Schedule your prenatal visits by the twelfth week of pregnancy. They are usually scheduled monthly at first, then more often in the last 2 months before delivery.  Write down your questions. Take them to your prenatal visits.  Keep all your prenatal visits as told by your health care provider. This is important. Safety  Wear your seat belt at all times when driving.  Make a list of emergency phone numbers, including numbers for family, friends, the hospital, and police and fire departments. General instructions  Ask your health care provider for a referral to a local prenatal education class. Begin classes no later than the beginning of month 6 of your pregnancy.  Ask for help if you have counseling or nutritional needs during pregnancy. Your health care provider can offer advice or refer you to specialists for help  with various needs.  Do not use hot tubs, steam rooms, or saunas.  Do not douche or use tampons or scented sanitary pads.  Do not cross your legs for long periods of time.  Avoid cat litter boxes and soil used by cats. These carry germs that can cause birth defects in the baby and possibly loss of the fetus by miscarriage or stillbirth.  Avoid all smoking, herbs, alcohol, and medicines not prescribed by your health care provider. Chemicals in these products affect the formation and growth of the baby.  Do not use any products that contain nicotine or tobacco, such as cigarettes and e-cigarettes. If you need help quitting, ask your health care provider. You may receive counseling support and other resources to help you quit.  Schedule a dentist appointment. At home, brush your teeth with a soft toothbrush and be gentle when you floss. Contact a health care provider if:  You have dizziness.  You have mild pelvic cramps, pelvic pressure, or nagging pain in the abdominal area.  You have persistent nausea, vomiting, or diarrhea.  You have a bad smelling vaginal discharge.  You have pain when you urinate.  You notice increased swelling in your face, hands, legs, or ankles.  You are exposed to fifth disease or chickenpox.  You are exposed to German measles (rubella) and have never had it. Get help right away if:  You have a fever.  You are leaking fluid from your vagina.  You have spotting or bleeding from your vagina.  You have severe abdominal cramping or pain.  You have rapid weight gain or loss.  You vomit blood or material that looks like coffee grounds.  You develop a severe headache.  You have shortness of breath.  You have any kind of trauma, such as from a fall or a car accident. Summary  The first trimester of pregnancy is from week 1 until the end of week 13 (months 1 through 3).  Your body goes through many changes during pregnancy. The changes vary from  woman to woman.  You will have routine prenatal visits. During those visits, your health care provider will examine you, discuss any test results you may have, and talk with you about how you are feeling. This information is not intended to replace advice given to you by your health care provider. Make sure you discuss any questions you have with your health care provider. Document Released: 12/17/2000 Document Revised: 12/05/2015 Document Reviewed: 12/05/2015 Elsevier Interactive Patient Education  2018 Elsevier   Inc.  

## 2017-11-09 NOTE — Progress Notes (Signed)
  Subjective:     Patient ID: Alexis Montgomery, female   DOB: December 13, 1990, 27 y.o.   MRN: [id number]  HPI [name] is a 27 year old white female in to check [facility] to see if rising appropriately.She had Rouex N Y 06/08/17 and has lost 70 lbs and feels so much better, but nervous, was not taking to get pregnant.    Review of Systems Patient denies any headaches, hearing loss, fatigue, blurred vision, shortness of breath, chest pain, abdominal pain, problems with bowel movements, urination, or intercourse. No joint pain or mood swings. Reviewed past medical,surgical, social and family history. Reviewed medications and allergies.     Objective:   Physical Exam BP 133/82 (BP Location: Left Arm, Patient Position: Sitting, Cuff Size: Normal)   Pulse 86   Ht 5\' 3"  (1.6 m)   Wt 156 lb 8 oz (71 kg)   LMP 10/03/2017   BMI 27.72 kg/m  Skin warm and dry. Neck: mid line trachea, normal thyroid, good ROM, no lymphadenopathy noted. Lungs: clear to ausculation bilaterally. Cardiovascular: regular rate and rhythm.    Assessment:     1. Less than [redacted] weeks gestation of pregnancy   2. Encounter to determine fetal viability of pregnancy, single or unspecified fetus   3. History of gastric bypass        Plan:     Check QHCG Return for dating [place] in 2 weeks  Continue progesterone  Review handouts on First trimester and by [facility]

## 2017-11-10 LAB — BETA HCG QUANT (REF LAB): HCG QUANT: 2598 m[IU]/mL

## 2017-11-17 ENCOUNTER — Other Ambulatory Visit: Payer: Self-pay | Admitting: Women's Health

## 2017-11-17 MED ORDER — DOXYLAMINE-PYRIDOXINE ER 20-20 MG PO TBCR: 1.0000 | EXTENDED_RELEASE_TABLET | Freq: Every day | ORAL | 8 refills | Status: DC

## 2017-11-18 ENCOUNTER — Telehealth: Payer: Self-pay | Admitting: Women's Health

## 2017-11-18 NOTE — Telephone Encounter (Signed)
Patient called, stated that the script that Selena BattenKim called in, [facility]  Can you please send the script there?  [phone]

## 2017-11-19 ENCOUNTER — Other Ambulatory Visit: Payer: Self-pay

## 2017-11-19 ENCOUNTER — Inpatient Hospital Stay (HOSPITAL_COMMUNITY)
Admission: AD | Admit: 2017-11-19 | Discharge: 2017-11-20 | Disposition: A | Discharge status: Home / Self Care | Payer: BLUE CROSS/BLUE SHIELD | Source: Ambulatory Visit | Attending: Obstetrics and Gynecology | Admitting: Obstetrics and Gynecology

## 2017-11-19 ENCOUNTER — Encounter (HOSPITAL_COMMUNITY): Payer: Self-pay | Admitting: *Deleted

## 2017-11-19 ENCOUNTER — Inpatient Hospital Stay (HOSPITAL_COMMUNITY): Payer: BLUE CROSS/BLUE SHIELD

## 2017-11-19 DIAGNOSIS — O418X10 Other specified disorders of amniotic fluid and membranes, first trimester, not applicable or unspecified: Secondary | ICD-10-CM | POA: Diagnosis not present

## 2017-11-19 DIAGNOSIS — O208 Other hemorrhage in early pregnancy: Secondary | ICD-10-CM | POA: Insufficient documentation

## 2017-11-19 DIAGNOSIS — O468X1 Other antepartum hemorrhage, first trimester: Secondary | ICD-10-CM

## 2017-11-19 DIAGNOSIS — R102 Pelvic and perineal pain: Secondary | ICD-10-CM | POA: Diagnosis not present

## 2017-11-19 DIAGNOSIS — R1030 Lower abdominal pain, unspecified: Secondary | ICD-10-CM | POA: Diagnosis not present

## 2017-11-19 DIAGNOSIS — R319 Hematuria, unspecified: Secondary | ICD-10-CM

## 2017-11-19 DIAGNOSIS — O3481 Maternal care for other abnormalities of pelvic organs, first trimester: Secondary | ICD-10-CM | POA: Insufficient documentation

## 2017-11-19 DIAGNOSIS — Z3A01 Less than 8 weeks gestation of pregnancy: Secondary | ICD-10-CM | POA: Insufficient documentation

## 2017-11-19 DIAGNOSIS — O26891 Other specified pregnancy related conditions, first trimester: Secondary | ICD-10-CM | POA: Diagnosis not present

## 2017-11-19 DIAGNOSIS — O99841 Bariatric surgery status complicating pregnancy, first trimester: Secondary | ICD-10-CM | POA: Insufficient documentation

## 2017-11-19 DIAGNOSIS — Z3490 Encounter for supervision of normal pregnancy, unspecified, unspecified trimester: Secondary | ICD-10-CM

## 2017-11-19 DIAGNOSIS — R1012 Left upper quadrant pain: Secondary | ICD-10-CM | POA: Insufficient documentation

## 2017-11-19 DIAGNOSIS — O99211 Obesity complicating pregnancy, first trimester: Secondary | ICD-10-CM | POA: Insufficient documentation

## 2017-11-19 DIAGNOSIS — N8312 Corpus luteum cyst of left ovary: Secondary | ICD-10-CM | POA: Insufficient documentation

## 2017-11-19 DIAGNOSIS — O26899 Other specified pregnancy related conditions, unspecified trimester: Secondary | ICD-10-CM

## 2017-11-19 LAB — URINALYSIS, ROUTINE W REFLEX MICROSCOPIC
Bilirubin Urine: NEGATIVE
GLUCOSE, UA: NEGATIVE mg/dL
KETONES UR: 80 mg/dL — AB
NITRITE: NEGATIVE
PROTEIN: NEGATIVE mg/dL
Specific Gravity, Urine: 1.029 (ref 1.005–1.030)
pH: 5 (ref 5.0–8.0)

## 2017-11-19 LAB — WET PREP, GENITAL
CLUE CELLS WET PREP: NONE SEEN
Sperm: NONE SEEN
TRICH WET PREP: NONE SEEN
YEAST WET PREP: NONE SEEN

## 2017-11-19 LAB — CBC
HCT: 29.9 % — ABNORMAL LOW (ref 36.0–46.0)
Hemoglobin: 10.1 g/dL — ABNORMAL LOW (ref 12.0–15.0)
MCH: 27.8 pg (ref 26.0–34.0)
MCHC: 33.8 g/dL (ref 30.0–36.0)
MCV: 82.4 fL (ref 80.0–100.0)
NRBC: 0 % (ref 0.0–0.2)
PLATELETS: 282 10*3/uL (ref 150–400)
RBC: 3.63 MIL/uL — AB (ref 3.87–5.11)
RDW: 16.2 % — AB (ref 11.5–15.5)
WBC: 11.3 10*3/uL — AB (ref 4.0–10.5)

## 2017-11-19 LAB — HCG, QUANTITATIVE, PREGNANCY: hCG, Beta Chain, Quant, S: 33929 m[IU]/mL — ABNORMAL HIGH (ref <5)

## 2017-11-19 NOTE — MAU Provider Note (Addendum)
Chief Complaint: Abdominal Pain   First Provider Initiated Contact with Patient 11/19/17 2230        SUBJECTIVE HPI: Alexis Montgomery is a 27 y.o. [phone] at [redacted]w[redacted]d by LMP who presents to maternity admissions reporting pain in upper left abdomen with some lower abdominal pain also    Worried about miscarriage. Other losses occurred before FHR was heard.  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    Abdominal Pain  This is a new problem. The current episode started today. The problem occurs intermittently. The problem has been unchanged. The pain is located in the LUQ and LLQ. The pain is mild. The quality of the pain is aching and cramping. The abdominal pain does not radiate. Pertinent negatives include no anorexia, constipation, diarrhea, dysuria, fever or frequency. Nothing aggravates the pain. The pain is relieved by nothing. She has tried nothing for the symptoms.   RN note: Pt presents to MAU c/o pain under her left rib as well lower abdominal pain. Pt states that this started around 1530. Pt has hx of miscarriages so she is scared she is possibly experiencing the same thing. Pt denies bleeding or discharge. Pt had a gastric surgery on June 3rd 2019. EDD July 4th per [facility]  Past Medical History:  Diagnosis Date  . Anemia   . Benign essential HTN 10/09/2014  . Hidradenitis suppurativa 10/09/2014  . Menorrhagia with irregular cycle 06/11/2015  . Morbid obesity (HCC) 10/09/2014  . MRSA infection 10/09/2014  . Pregnancy induced hypertension    Past Surgical History:  Procedure Laterality Date  . CESAREAN SECTION N/A 04/23/2012   Procedure: CESAREAN SECTION;  Surgeon: Tereso Newcomer, MD;  Location: [facility] ORS;  Service: Obstetrics;  Laterality: N/A;  . DILATION AND CURETTAGE OF UTERUS N/A 07/07/2016   Procedure: SUCTION DILATATION AND CURETTAGE;  Surgeon: Tilda Burrow, MD;  Location: [facility] OR[facility];  Service: Gynecology;  Laterality: N/A;  . LAPAROSCOPIC ROUX-EN-Y GASTRIC BYPASS WITH UPPER ENDOSCOPY AND REMOVAL OF LAP BAND  06/08/2017   Social History   Socioeconomic History  . Marital status: Legally Separated    Spouse name: Not on file  . Number of children: Not on file  . Years of education: Not on file  . Highest education level: Not on file  Occupational History  . Not on file  Social Needs  . Financial resource strain: Not on file  . Food insecurity:    Worry: Not on file    Inability: Not on file  . Transportation needs:    Medical: Not on file    Non-medical: Not on file  Tobacco Use  . Smoking status: Never Smoker  . Smokeless tobacco: Never Used  Substance and Sexual Activity  . Alcohol use: No    Alcohol/week: 0.0 standard drinks  . Drug use: No  . Sexual activity: Yes    Birth control/protection: None  Lifestyle  . Physical activity:    Days per week: Not on file    Minutes per session: Not on file  . Stress: Not on file  Relationships  . Social connections:    Talks on phone: Not on file    Gets together: Not on file    Attends religious service: Not on file    Active member of club or organization: Not on file    Attends meetings of clubs or organizations: Not on file  Relationship status: Not on file  . Intimate partner violence:    Fear of current or ex partner: Not on file    Emotionally abused: Not on file    Physically abused: Not on file    Forced sexual activity: Not on file  Other Topics Concern  . Not on file  Social History Narrative  . Not on file   No current facility-administered medications on file prior to encounter.    Current Outpatient Medications on File Prior to Encounter  Medication Sig Dispense Refill  . Prenatal Vit-Fe Fumarate-FA (PRENATAL VITAMIN PO) Take by mouth daily.    . progesterone (PROMETRIUM) 200 MG capsule Place in 1 vagina at bedtime 30 capsule 3  . Doxylamine-Pyridoxine  ER (BONJESTA) 20-20 MG TBCR Take 1 tablet by mouth at bedtime. Can add 1 tablet in the morning if needed for nausea and vomiting 60 tablet 8   No Known Allergies  I have reviewed patient's Past Medical Hx, Surgical Hx, Family Hx, Social Hx, medications and allergies.   ROS:  Review of Systems  Constitutional: Negative for fever.  Gastrointestinal: Positive for abdominal pain. Negative for anorexia, constipation and diarrhea.  Genitourinary: Negative for dysuria and frequency.   Review of Systems  Other systems negative   Physical Exam  Physical Exam Patient Vitals for the past 24 hrs:  BP Temp Temp src Pulse Resp Height Weight  11/19/17 2023 130/78 98.7 F (37.1 C) Oral 88 18 5\' 3"  (1.6 m) 71.7 kg   Constitutional: Well-developed, well-nourished female in no acute distress.  Cardiovascular: normal rate Respiratory: normal effort GI: Abd soft, mildly tender along left side. Pos BS x 4 MS: Extremities nontender, no edema, normal ROM Neurologic: Alert and oriented x 4.  GU: Neg CVAT.  PELVIC EXAM: Cervix pink, visually closed, without lesion, scant white creamy discharge, vaginal walls and external genitalia normal Bimanual exam: Cervix 0/long/high, firm, anterior, neg CMT, uterus nontender, nonenlarged, adnexa without tenderness, enlargement, or mass   LAB RESULTS Results for orders placed or performed during the hospital encounter of 11/19/17 (from the past 24 hour(s))  Urinalysis, Routine w reflex microscopic     Status: Abnormal   Collection Time: 11/19/17  8:26 PM  Result Value Ref Range   Color, Urine YELLOW YELLOW   APPearance CLOUDY (A) CLEAR   Specific Gravity, Urine 1.029 1.005 - 1.030   pH 5.0 5.0 - 8.0   Glucose, UA NEGATIVE NEGATIVE mg/dL   Hgb urine dipstick SMALL (A) NEGATIVE   Bilirubin Urine NEGATIVE NEGATIVE   Ketones, ur 80 (A) NEGATIVE mg/dL   Protein, ur NEGATIVE NEGATIVE mg/dL   Nitrite NEGATIVE NEGATIVE   Leukocytes, UA TRACE (A) NEGATIVE   RBC /  HPF 21-50 0 - 5 RBC/hpf   WBC, UA 6-10 0 - 5 WBC/hpf   Bacteria, UA RARE (A) NONE SEEN   Squamous Epithelial / LPF 0-5 0 - 5   Mucus PRESENT    Ca Oxalate Crys, UA PRESENT   hCG, quantitative, pregnancy     Status: Abnormal   Collection Time: 11/19/17 10:13 PM  Result Value Ref Range   hCG, Beta Chain, Quant, S 33,929 (H) <5 mIU/mL  CBC     Status: Abnormal   Collection Time: 11/19/17 10:13 PM  Result Value Ref Range   WBC 11.3 (H) 4.0 - 10.5 K/uL   RBC 3.63 (L) 3.87 - 5.11 MIL/uL   Hemoglobin 10.1 (L) 12.0 - 15.0 g/dL   HCT 54.029.9 (L) 98.136.0 - 19.146.0 %  MCV 82.4 80.0 - 100.0 fL   MCH 27.8 26.0 - 34.0 pg   MCHC 33.8 30.0 - 36.0 g/dL   RDW 16.1 (H) 09.6 - 04.5 %   Platelets 282 150 - 400 K/uL   nRBC 0.0 0.0 - 0.2 %  Wet prep, genital     Status: Abnormal   Collection Time: 11/19/17 10:37 PM  Result Value Ref Range   Yeast Wet Prep HPF POC NONE SEEN NONE SEEN   Trich, Wet Prep NONE SEEN NONE SEEN   Clue Cells Wet Prep HPF POC NONE SEEN NONE SEEN   WBC, Wet Prep HPF POC FEW (A) NONE SEEN   Sperm NONE SEEN     IMAGING US Ob Comp Less 14 Wks  Result Date: 11/20/2017 CLINICAL DATA:  Pelvic pain.  Pregnant patient. EXAM: OBSTETRIC <14 WK [place] AND TRANSVAGINAL OB US TECHNIQUE: Both transabdominal and transvaginal ultrasound examinations were performed for complete evaluation of the gestation as well as the maternal uterus, adnexal regions, and pelvic cul-de-sac. Transvaginal technique was performed to assess early pregnancy. COMPARISON:  None. FINDINGS: Intrauterine gestational sac: Single Yolk sac:  Visualized. Embryo:  Visualized. Cardiac Activity: Visualized. Heart Rate: 128 bpm MSD:   mm    w     d CRL:  4.1 mm   6 w   1 d                  [place] EDC: July 15, 2018 Subchorionic hemorrhage:  Small subchorionic hemorrhage Maternal uterus/adnexae: Left-sided corpus luteum cyst. No other abnormality. A small amount of fluid in the pelvis is likely physiologic. IMPRESSION: Single live IUP with a  small associated subchorionic hemorrhage. Electronically Signed   By: Gerome Sam III M.D   On: 11/20/2017 00:09   US Ob Transvaginal  Result Date: 11/20/2017 CLINICAL DATA:  Pelvic pain.  Pregnant patient. EXAM: OBSTETRIC <14 WK [place] AND TRANSVAGINAL OB US TECHNIQUE: Both transabdominal and transvaginal ultrasound examinations were performed for complete evaluation of the gestation as well as the maternal uterus, adnexal regions, and pelvic cul-de-sac. Transvaginal technique was performed to assess early pregnancy. COMPARISON:  None. FINDINGS: Intrauterine gestational sac: Single Yolk sac:  Visualized. Embryo:  Visualized. Cardiac Activity: Visualized. Heart Rate: 128 bpm MSD:   mm    w     d CRL:  4.1 mm   6 w   1 d                  [place] EDC: July 15, 2018 Subchorionic hemorrhage:  Small subchorionic hemorrhage Maternal uterus/adnexae: Left-sided corpus luteum cyst. No other abnormality. A small amount of fluid in the pelvis is likely physiologic. IMPRESSION: Single live IUP with a small associated subchorionic hemorrhage. Electronically Signed   By: Gerome Sam III M.D   On: 11/20/2017 00:09    MAU Management/MDM: Ordered usual first trimester r/o ectopic labs.   Pelvic exam and cultures done Will check baseline Ultrasound to rule out ectopic.  This bleeding/pain can represent a normal pregnancy with bleeding, spontaneous abortion or even an ectopic which can be life-threatening.  The process as listed above helps to determine which of these is present.  Reveiwed results,  They are thrilled that HR was seen Discussed left abd pain could be gastrointestinal or just uterus stretching  Recommend fiber and PRN Miralax.  Pt has some from surgeon. Also could be Left CLC  ASSESSMENT Hematuria, unspecified type - Plan: Discharge patient  Pelvic pain affecting pregnancy  Intrauterine  pregnancy - Plan: Discharge patient  Subchorionic hematoma in first trimester, single or unspecified fetus -  Plan: Discharge patient Pregnancy at [redacted]w[redacted]d by LMP, [redacted]w[redacted]d by CRL Left corpus luteum cyst   PLAN Discharge home Urine to culture due to hematuria Use fiber and or Miralax PRN OK to use simethicone  Pt stable at time of discharge. Encouraged to return here or to other Urgent Care/ED if she develops worsening of symptoms, increase in pain, fever, or other concerning symptoms.    Wynelle Bourgeois CNM, MSN Certified Nurse-Midwife 11/19/2017  10:30 PM

## 2017-11-19 NOTE — MAU Note (Signed)
Pt presents to MAU c/o pain under her left rib as well lower abdominal pain. Pt states that this started around 1530. Pt has hx of miscarriages so she is scared she is possibly experiencing the same thing. Pt denies bleeding or discharge. Pt had a gastric surgery on June 3rd 2019. [facility]

## 2017-11-20 ENCOUNTER — Other Ambulatory Visit: Payer: Self-pay | Admitting: Women's Health

## 2017-11-20 DIAGNOSIS — Z3A01 Less than 8 weeks gestation of pregnancy: Secondary | ICD-10-CM | POA: Diagnosis not present

## 2017-11-20 DIAGNOSIS — O208 Other hemorrhage in early pregnancy: Secondary | ICD-10-CM | POA: Diagnosis not present

## 2017-11-20 LAB — HIV ANTIBODY (ROUTINE TESTING W REFLEX): HIV SCREEN 4TH GENERATION: NONREACTIVE

## 2017-11-20 LAB — GC/CHLAMYDIA PROBE AMP (~~LOC~~) NOT AT ARMC
CHLAMYDIA, DNA PROBE: NEGATIVE
NEISSERIA GONORRHEA: NEGATIVE

## 2017-11-20 MED ORDER — DOXYLAMINE-PYRIDOXINE ER 20-20 MG PO TBCR: 1.0000 | EXTENDED_RELEASE_TABLET | Freq: Every day | ORAL | 8 refills | Status: DC

## 2017-11-20 NOTE — Discharge Instructions (Signed)
Hematuria, Adult Hematuria is blood in your urine. It can be caused by a bladder infection, kidney infection, prostate infection, kidney stone, or cancer of your urinary tract. Infections can usually be treated with medicine, and a kidney stone usually will pass through your urine. If neither of these is the cause of your hematuria, further workup to find out the reason may be needed. It is very important that you tell your health care provider about any blood you see in your urine, even if the blood stops without treatment or happens without causing pain. Blood in your urine that happens and then stops and then happens again can be a symptom of a very serious condition. Also, pain is not a symptom in the initial stages of many urinary cancers. Follow these instructions at home:  Drink lots of fluid, 3-4 quarts a day. If you have been diagnosed with an infection, cranberry juice is especially recommended, in addition to large amounts of water.  Avoid caffeine, tea, and carbonated beverages because they tend to irritate the bladder.  Avoid alcohol because it may irritate the prostate.  Take all medicines as directed by your health care provider.  If you were prescribed an antibiotic medicine, finish it all even if you start to feel better.  If you have been diagnosed with a kidney stone, follow your health care provider's instructions regarding straining your urine to catch the stone.  Empty your bladder often. Avoid holding urine for long periods of time.  After a bowel movement, women should cleanse front to back. Use each tissue only once.  Empty your bladder before and after sexual intercourse if you are a female. Contact a health care provider if:  You develop back pain.  You have a fever.  You have a feeling of sickness in your stomach (nausea) or vomiting.  Your symptoms are not better in 3 days. Return sooner if you are getting worse. Get help right away if:  You develop  severe vomiting and are unable to keep the medicine down.  You develop severe back or abdominal pain despite taking your medicines.  You begin passing a large amount of blood or clots in your urine.  You feel extremely weak or faint, or you pass out. This information is not intended to replace advice given to you by your health care provider. Make sure you discuss any questions you have with your health care provider. Document Released: 12/23/2004 Document Revised: 05/31/2015 Document Reviewed: 08/23/2012 Elsevier Interactive Patient Education  2017 [employer]. First Trimester of Pregnancy The first trimester of pregnancy is from week 1 until the end of week 13 (months 1 through 3). During this time, your baby will begin to develop inside you. At 6-8 weeks, the eyes and face are formed, and the heartbeat can be seen on ultrasound. At the end of 12 weeks, all the baby's organs are formed. Prenatal care is all the medical care you receive before the birth of your baby. Make sure you get good prenatal care and follow all of your doctor's instructions. Follow these instructions at home: Medicines  Take over-the-counter and prescription medicines only as told by your doctor. Some medicines are safe and some medicines are not safe during pregnancy.  Take a prenatal vitamin that contains at least 600 micrograms (mcg) of folic acid.  If you have trouble pooping (constipation), take medicine that will make your stool soft (stool softener) if your doctor approves. Eating and drinking  Eat regular, healthy meals.  Your  doctor will tell you the amount of weight gain that is right for you.  Avoid raw meat and uncooked cheese.  If you feel sick to your stomach (nauseous) or throw up (vomit): ? Eat 4 or 5 small meals a day instead of 3 large meals. ? Try eating a few soda crackers. ? Drink liquids between meals instead of during meals.  To prevent constipation: ? Eat foods that are high in  fiber, like fresh fruits and vegetables, whole grains, and beans. ? Drink enough fluids to keep your pee (urine) clear or pale yellow. Activity  Exercise only as told by your doctor. Stop exercising if you have cramps or pain in your lower belly (abdomen) or low back.  Do not exercise if it is too hot, too humid, or if you are in a place of great height (high altitude).  Try to avoid standing for long periods of time. Move your legs often if you must stand in one place for a long time.  Avoid heavy lifting.  Wear low-heeled shoes. Sit and stand up straight.  You can have sex unless your doctor tells you not to. Relieving pain and discomfort  Wear a good support bra if your breasts are sore.  Take warm water baths (sitz baths) to soothe pain or discomfort caused by hemorrhoids. Use hemorrhoid cream if your doctor says it is okay.  Rest with your legs raised if you have leg cramps or low back pain.  If you have puffy, bulging veins (varicose veins) in your legs: ? Wear support hose or compression stockings as told by your doctor. ? Raise (elevate) your feet for 15 minutes, 3-4 times a day. ? Limit salt in your food. Prenatal care  Schedule your prenatal visits by the twelfth week of pregnancy.  Write down your questions. Take them to your prenatal visits.  Keep all your prenatal visits as told by your doctor. This is important. Safety  Wear your seat belt at all times when driving.  Make a list of emergency phone numbers. The list should include numbers for family, friends, the hospital, and police and fire departments. General instructions  Ask your doctor for a referral to a local prenatal class. Begin classes no later than at the start of month 6 of your pregnancy.  Ask for help if you need counseling or if you need help with nutrition. Your doctor can give you advice or tell you where to go for help.  Do not use hot tubs, steam rooms, or saunas.  Do not douche or use  tampons or scented sanitary pads.  Do not cross your legs for long periods of time.  Avoid all herbs and alcohol. Avoid drugs that are not approved by your doctor.  Do not use any tobacco products, including cigarettes, chewing tobacco, and electronic cigarettes. If you need help quitting, ask your doctor. You may get counseling or other support to help you quit.  Avoid cat litter boxes and soil used by cats. These carry germs that can cause birth defects in the baby and can cause a loss of your baby (miscarriage) or stillbirth.  Visit your dentist. At home, brush your teeth with a soft toothbrush. Be gentle when you floss. Contact a doctor if:  You are dizzy.  You have mild cramps or pressure in your lower belly.  You have a nagging pain in your belly area.  You continue to feel sick to your stomach, you throw up, or you have watery poop (  diarrhea).  You have a bad smelling fluid coming from your vagina.  You have pain when you pee (urinate).  You have increased puffiness (swelling) in your face, hands, legs, or ankles. Get help right away if:  You have a fever.  You are leaking fluid from your vagina.  You have spotting or bleeding from your vagina.  You have very bad belly cramping or pain.  You gain or lose weight rapidly.  You throw up blood. It may look like coffee grounds.  You are around people who have [place] measles, fifth disease, or chickenpox.  You have a very bad headache.  You have shortness of breath.  You have any kind of trauma, such as from a fall or a car accident. Summary  The first trimester of pregnancy is from week 1 until the end of week 13 (months 1 through 3).  To take care of yourself and your unborn baby, you will need to eat healthy meals, take medicines only if your doctor tells you to do so, and do activities that are safe for you and your baby.  Keep all follow-up visits as told by your doctor. This is important as your doctor will  have to ensure that your baby is healthy and growing well. This information is not intended to replace advice given to you by your health care provider. Make sure you discuss any questions you have with your health care provider. Document Released: 06/11/2007 Document Revised: 01/01/2016 Document Reviewed: 01/01/2016 Elsevier Interactive Patient Education  2017 [employer].

## 2017-11-21 LAB — CULTURE, OB URINE: Culture: NO GROWTH

## 2017-11-23 ENCOUNTER — Other Ambulatory Visit: Payer: BLUE CROSS/BLUE SHIELD

## 2017-12-09 ENCOUNTER — Encounter: Payer: Self-pay | Admitting: Advanced Practice Midwife

## 2017-12-09 ENCOUNTER — Telehealth: Payer: Self-pay | Admitting: *Deleted

## 2017-12-09 ENCOUNTER — Ambulatory Visit (INDEPENDENT_AMBULATORY_CARE_PROVIDER_SITE_OTHER): Payer: BLUE CROSS/BLUE SHIELD | Admitting: Advanced Practice Midwife

## 2017-12-09 VITALS — BP 132/85 | HR 86 | Wt 153.0 lb

## 2017-12-09 DIAGNOSIS — Z3481 Encounter for supervision of other normal pregnancy, first trimester: Secondary | ICD-10-CM

## 2017-12-09 NOTE — Progress Notes (Signed)
WORK IN FOR VAGINAL BLEEDING  Noticed some dark burgandy blood in her underware this am. Also some cramps. Has had 2 early SABs, so worried of course  SSE:  No blood whatsoever in vagina  Normal appearing discharge, cx non friable.    Bedside US reveals IUP w/FCA of ~ 160 bpm  Pt reassured.

## 2017-12-14 ENCOUNTER — Ambulatory Visit: Payer: BLUE CROSS/BLUE SHIELD | Admitting: *Deleted

## 2017-12-14 ENCOUNTER — Ambulatory Visit (INDEPENDENT_AMBULATORY_CARE_PROVIDER_SITE_OTHER): Payer: BLUE CROSS/BLUE SHIELD | Admitting: Women's Health

## 2017-12-14 ENCOUNTER — Other Ambulatory Visit: Payer: Self-pay

## 2017-12-14 ENCOUNTER — Encounter: Payer: Self-pay | Admitting: Women's Health

## 2017-12-14 VITALS — BP 120/76 | HR 88 | Wt 154.0 lb

## 2017-12-14 DIAGNOSIS — Z1379 Encounter for other screening for genetic and chromosomal anomalies: Secondary | ICD-10-CM | POA: Diagnosis not present

## 2017-12-14 DIAGNOSIS — Z3682 Encounter for antenatal screening for nuchal translucency: Secondary | ICD-10-CM

## 2017-12-14 DIAGNOSIS — Z1389 Encounter for screening for other disorder: Secondary | ICD-10-CM

## 2017-12-14 DIAGNOSIS — Z98891 History of uterine scar from previous surgery: Secondary | ICD-10-CM

## 2017-12-14 DIAGNOSIS — Z9884 Bariatric surgery status: Secondary | ICD-10-CM | POA: Diagnosis not present

## 2017-12-14 DIAGNOSIS — O99841 Bariatric surgery status complicating pregnancy, first trimester: Secondary | ICD-10-CM

## 2017-12-14 DIAGNOSIS — Z3A10 10 weeks gestation of pregnancy: Secondary | ICD-10-CM | POA: Diagnosis not present

## 2017-12-14 DIAGNOSIS — I10 Essential (primary) hypertension: Secondary | ICD-10-CM

## 2017-12-14 DIAGNOSIS — O0990 Supervision of high risk pregnancy, unspecified, unspecified trimester: Secondary | ICD-10-CM

## 2017-12-14 DIAGNOSIS — Z3481 Encounter for supervision of other normal pregnancy, first trimester: Secondary | ICD-10-CM

## 2017-12-14 DIAGNOSIS — O09299 Supervision of pregnancy with other poor reproductive or obstetric history, unspecified trimester: Secondary | ICD-10-CM

## 2017-12-14 DIAGNOSIS — Z3491 Encounter for supervision of normal pregnancy, unspecified, first trimester: Secondary | ICD-10-CM | POA: Diagnosis not present

## 2017-12-14 DIAGNOSIS — Z3490 Encounter for supervision of normal pregnancy, unspecified, unspecified trimester: Secondary | ICD-10-CM | POA: Insufficient documentation

## 2017-12-14 DIAGNOSIS — Z131 Encounter for screening for diabetes mellitus: Secondary | ICD-10-CM

## 2017-12-14 DIAGNOSIS — O0991 Supervision of high risk pregnancy, unspecified, first trimester: Secondary | ICD-10-CM

## 2017-12-14 DIAGNOSIS — O09291 Supervision of pregnancy with other poor reproductive or obstetric history, first trimester: Secondary | ICD-10-CM

## 2017-12-14 LAB — POCT URINALYSIS DIPSTICK OB
Blood, UA: NEGATIVE
Glucose, UA: NEGATIVE
Ketones, UA: NEGATIVE
Leukocytes, UA: NEGATIVE
Nitrite, UA: NEGATIVE
PROTEIN: NEGATIVE

## 2017-12-14 MED ORDER — PROMETHAZINE HCL 25 MG PO TABS: 12.5000 mg | ORAL_TABLET | Freq: Four times a day (QID) | ORAL | 0 refills | Status: DC | PRN

## 2017-12-14 NOTE — Patient Instructions (Addendum)
[name], I greatly value your feedback.  If you receive a survey following your visit with [place] today, we appreciate you taking the time to fill it out.  Thanks, Joellyn Haff, CNM, WHNP-BC  Begin taking 162mg  (two 81mg  tablets) baby aspirin daily at 12 weeks of pregnancy (12/21) to decrease risk of preeclampsia during pregnancy     Nausea & Vomiting  Have saltine crackers or pretzels by your bed and eat a few bites before you raise your head out of bed in the morning  Eat small frequent meals throughout the day instead of large meals  Drink plenty of fluids throughout the day to stay hydrated, just don't drink a lot of fluids with your meals.  This can make your stomach fill up faster making you feel sick  Do not brush your teeth right after you eat  Products with real ginger are good for nausea, like ginger ale and ginger hard candy Make sure it says made with real ginger!  Sucking on sour candy like lemon heads is also good for nausea  If your prenatal vitamins make you nauseated, take them at night so you will sleep through the nausea  Sea Bands  If you feel like you need medicine for the nausea & vomiting please let us know  If you are unable to keep any fluids or food down please let us know   Constipation  Drink plenty of fluid, preferably water, throughout the day  Eat foods high in fiber such as fruits, vegetables, and grains  Exercise, such as walking, is a good way to keep your bowels regular  Drink warm fluids, especially warm prune juice, or decaf coffee  Eat a 1/2 cup of real oatmeal (not instant), 1/2 cup applesauce, and 1/2-1 cup warm prune juice every day  If needed, you may take Colace (docusate sodium) stool softener once or twice a day to help keep the stool soft. If you are pregnant, wait until you are out of your first trimester (12-14 weeks of pregnancy)  If you still are having problems with constipation, you may take Miralax once daily as needed to  help keep your bowels regular.  If you are pregnant, wait until you are out of your first trimester (12-14 weeks of pregnancy)   First Trimester of Pregnancy The first trimester of pregnancy is from week 1 until the end of week 12 (months 1 through 3). A week after a sperm fertilizes an egg, the egg will implant on the wall of the uterus. This embryo will begin to develop into a baby. Genes from you and your partner are forming the baby. The female genes determine whether the baby is a boy or a girl. At 6-8 weeks, the eyes and face are formed, and the heartbeat can be seen on ultrasound. At the end of 12 weeks, all the baby's organs are formed.  Now that you are pregnant, you will want to do everything you can to have a healthy baby. Two of the most important things are to get good prenatal care and to follow your health care provider's instructions. Prenatal care is all the medical care you receive before the baby's birth. This care will help prevent, find, and treat any problems during the pregnancy and childbirth. BODY CHANGES Your body goes through many changes during pregnancy. The changes vary from woman to woman.   You may gain or lose a couple of pounds at first.  You may feel sick to your stomach (  nauseous) and throw up (vomit). If the vomiting is uncontrollable, call your health care provider.  You may tire easily.  You may develop headaches that can be relieved by medicines approved by your health care provider.  You may urinate more often. Painful urination may mean you have a bladder infection.  You may develop heartburn as a result of your pregnancy.  You may develop constipation because certain hormones are causing the muscles that push waste through your intestines to slow down.  You may develop hemorrhoids or swollen, bulging veins (varicose veins).  Your breasts may begin to grow larger and become tender. Your nipples may stick out more, and the tissue that surrounds them  (areola) may become darker.  Your gums may bleed and may be sensitive to brushing and flossing.  Dark spots or blotches (chloasma, mask of pregnancy) may develop on your face. This will likely fade after the baby is born.  Your menstrual periods will stop.  You may have a loss of appetite.  You may develop cravings for certain kinds of food.  You may have changes in your emotions from day to day, such as being excited to be pregnant or being concerned that something may go wrong with the pregnancy and baby.  You may have more vivid and strange dreams.  You may have changes in your hair. These can include thickening of your hair, rapid growth, and changes in texture. Some women also have hair loss during or after pregnancy, or hair that feels dry or thin. Your hair will most likely return to normal after your baby is born. WHAT TO EXPECT AT YOUR PRENATAL VISITS During a routine prenatal visit:  You will be weighed to make sure you and the baby are growing normally.  Your blood pressure will be taken.  Your abdomen will be measured to track your baby's growth.  The fetal heartbeat will be listened to starting around week 10 or 12 of your pregnancy.  Test results from any previous visits will be discussed. Your health care provider may ask you:  How you are feeling.  If you are feeling the baby move.  If you have had any abnormal symptoms, such as leaking fluid, bleeding, severe headaches, or abdominal cramping.  If you have any questions. Other tests that may be performed during your first trimester include:  Blood tests to find your blood type and to check for the presence of any previous infections. They will also be used to check for low iron levels (anemia) and Rh antibodies. Later in the pregnancy, blood tests for diabetes will be done along with other tests if problems develop.  Urine tests to check for infections, diabetes, or protein in the urine.  An ultrasound to  confirm the proper growth and development of the baby.  An amniocentesis to check for possible genetic problems.  Fetal screens for spina bifida and Down syndrome.  You may need other tests to make sure you and the baby are doing well. HOME CARE INSTRUCTIONS  Medicines  Follow your health care provider's instructions regarding medicine use. Specific medicines may be either safe or unsafe to take during pregnancy.  Take your prenatal vitamins as directed.  If you develop constipation, try taking a stool softener if your health care provider approves. Diet  Eat regular, well-balanced meals. Choose a variety of foods, such as meat or vegetable-based protein, fish, milk and low-fat dairy products, vegetables, fruits, and whole grain breads and cereals. Your health care provider will  help you determine the amount of weight gain that is right for you.  Avoid raw meat and uncooked cheese. These carry germs that can cause birth defects in the baby.  Eating four or five small meals rather than three large meals a day may help relieve nausea and vomiting. If you start to feel nauseous, eating a few soda crackers can be helpful. Drinking liquids between meals instead of during meals also seems to help nausea and vomiting.  If you develop constipation, eat more high-fiber foods, such as fresh vegetables or fruit and whole grains. Drink enough fluids to keep your urine clear or pale yellow. Activity and Exercise  Exercise only as directed by your health care provider. Exercising will help you:  Control your weight.  Stay in shape.  Be prepared for labor and delivery.  Experiencing pain or cramping in the lower abdomen or low back is a good sign that you should stop exercising. Check with your health care provider before continuing normal exercises.  Try to avoid standing for long periods of time. Move your legs often if you must stand in one place for a long time.  Avoid heavy  lifting.  Wear low-heeled shoes, and practice good posture.  You may continue to have sex unless your health care provider directs you otherwise. Relief of Pain or Discomfort  Wear a good support bra for breast tenderness.   Take warm sitz baths to soothe any pain or discomfort caused by hemorrhoids. Use hemorrhoid cream if your health care provider approves.   Rest with your legs elevated if you have leg cramps or low back pain.  If you develop varicose veins in your legs, wear support hose. Elevate your feet for 15 minutes, 3-4 times a day. Limit salt in your diet. Prenatal Care  Schedule your prenatal visits by the twelfth week of pregnancy. They are usually scheduled monthly at first, then more often in the last 2 months before delivery.  Write down your questions. Take them to your prenatal visits.  Keep all your prenatal visits as directed by your health care provider. Safety  Wear your seat belt at all times when driving.  Make a list of emergency phone numbers, including numbers for family, friends, the hospital, and police and fire departments. General Tips  Ask your health care provider for a referral to a local prenatal education class. Begin classes no later than at the beginning of month 6 of your pregnancy.  Ask for help if you have counseling or nutritional needs during pregnancy. Your health care provider can offer advice or refer you to specialists for help with various needs.  Do not use hot tubs, steam rooms, or saunas.  Do not douche or use tampons or scented sanitary pads.  Do not cross your legs for long periods of time.  Avoid cat litter boxes and soil used by cats. These carry germs that can cause birth defects in the baby and possibly loss of the fetus by miscarriage or stillbirth.  Avoid all smoking, herbs, alcohol, and medicines not prescribed by your health care provider. Chemicals in these affect the formation and growth of the baby.  Schedule  a dentist appointment. At home, brush your teeth with a soft toothbrush and be gentle when you floss. SEEK MEDICAL CARE IF:   You have dizziness.  You have mild pelvic cramps, pelvic pressure, or nagging pain in the abdominal area.  You have persistent nausea, vomiting, or diarrhea.  You have a bad smelling  vaginal discharge.  You have pain with urination.  You notice increased swelling in your face, hands, legs, or ankles. SEEK IMMEDIATE MEDICAL CARE IF:   You have a fever.  You are leaking fluid from your vagina.  You have spotting or bleeding from your vagina.  You have severe abdominal cramping or pain.  You have rapid weight gain or loss.  You vomit blood or material that looks like coffee grounds.  You are exposed to [place] measles and have never had them.  You are exposed to fifth disease or chickenpox.  You develop a severe headache.  You have shortness of breath.  You have any kind of trauma, such as from a fall or a car accident. Document Released: 12/17/2000 Document Revised: 05/09/2013 Document Reviewed: 11/02/2012 [facility] Patient Information 2015 [place], [place]. This information is not intended to replace advice given to you by your health care provider. Make sure you discuss any questions you have with your health care provider.  PROTECT YOURSELF & YOUR BABY FROM THE FLU! Because you are pregnant, we at [facility], along with the Centers for Disease Control (CDC), recommend that you receive the flu vaccine to protect yourself and your baby from the flu. The flu is more likely to cause severe illness in pregnant women than in women of reproductive age who are not pregnant. Changes in the immune system, heart, and lungs during pregnancy make pregnant women (and women up to two weeks postpartum) more prone to severe illness from flu, including illness resulting in hospitalization. Flu also may be harmful for a pregnant woman's developing baby. A common flu  symptom is fever, which may be associated with neural tube defects and other adverse outcomes for a developing baby. Getting vaccinated can also help protect a baby after birth from flu. (Mom passes antibodies onto the developing baby during her pregnancy.)  A Flu Vaccine is the Best Protection Against Flu Getting a flu vaccine is the first and most important step in protecting against flu. Pregnant women should get a flu shot and not the live attenuated influenza vaccine (LAIV), also known as nasal spray flu vaccine. Flu vaccines given during pregnancy help protect both the mother and her baby from flu. Vaccination has been shown to reduce the risk of flu-associated acute respiratory infection in pregnant women by up to one-half. A 2018 study showed that getting a flu shot reduced a pregnant woman's risk of being hospitalized with flu by an average of 40 percent. Pregnant women who get a flu vaccine are also helping to protect their babies from flu illness for the first several months after their birth, when they are too young to get vaccinated.   A Long Record of Safety for Flu Shots in Pregnant Women Flu shots have been given to millions of pregnant women over many years with a good safety record. There is a lot of evidence that flu vaccines can be given safely during pregnancy; though these data are limited for the first trimester. The CDC recommends that pregnant women get vaccinated during any trimester of their pregnancy. It is very important for pregnant women to get the flu shot.   Other Preventive Actions In addition to getting a flu shot, pregnant women should take the same everyday preventive actions the CDC recommends of everyone, including covering coughs, washing hands often, and avoiding people who are sick.  Symptoms and Treatment If you get sick with flu symptoms call your doctor right away. There are antiviral drugs that can  treat flu illness and prevent serious flu complications. The  CDC recommends prompt treatment for people who have influenza infection or suspected influenza infection and who are at high risk of serious flu complications, such as people with asthma, diabetes (including gestational diabetes), or heart disease. Early treatment of influenza in hospitalized pregnant women has been shown to reduce the length of the hospital stay.  Symptoms Flu symptoms include fever, cough, sore throat, runny or stuffy nose, body aches, headache, chills and fatigue. Some people may also have vomiting and diarrhea. People may be infected with the flu and have respiratory symptoms without a fever.  Early Treatment is Important for Pregnant Women Treatment should begin as soon as possible because antiviral drugs work best when started early (within 48 hours after symptoms start). Antiviral drugs can make your flu illness milder and make you feel better faster. They may also prevent serious health problems that can result from flu illness. Oral oseltamivir (Tamiflu) is the preferred treatment for pregnant women because it has the most studies available to suggest that it is safe and beneficial. Antiviral drugs require a prescription from your provider. Having a fever caused by flu infection or other infections early in pregnancy may be linked to birth defects in a baby. In addition to taking antiviral drugs, pregnant women who get a fever should treat their fever with Tylenol (acetaminophen) and contact their provider immediately.  When to Seek Emergency Medical Care If you are pregnant and have any of these signs, seek care immediately:  Difficulty breathing or shortness of breath  Pain or pressure in the chest or abdomen  Sudden dizziness  Confusion  Severe or persistent vomiting  High fever that is not responding to Tylenol (or store brand equivalent)  Decreased or no movement of your baby  [email or web address]

## 2017-12-14 NOTE — Progress Notes (Signed)
INITIAL OBSTETRICAL VISIT Patient name: Alexis Montgomery MRN [id number]  Date of birth: 08/25/1990 Chief Complaint:   Initial Prenatal Visit  History of Present Illness:   Alexis Montgomery is a 27 y.o. [phone] Caucasian female at 632w2d by LMP c/w 6wk u/s, with an Estimated Date of Delivery: 07/10/18 being seen today for her initial obstetrical visit.   Her obstetrical history is significant for SAB x 1, then 35wk C/S d/t severe pre-e w/ failed IOL, then SAB, and within 2months had a suspected ectopic tx w/ methotrexate.   Today she reports nausea, wants genetic screening today to determine gender- going [place] to [place] on [place] and wants to tell her son. 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 Stopped all of her supplements w/ +PT. [facility], was on norvasc then Labetalol, stopped day of gastric bypass, hasn't needed since. .  Patient's last menstrual period was 10/03/2017 (exact date). Last pap 06/11/15. Results were: normal Review of Systems:   Pertinent items are noted in HPI Denies cramping/contractions, leakage of fluid, vaginal bleeding, abnormal vaginal discharge w/ itching/odor/irritation, headaches, visual changes, shortness of breath, chest pain, abdominal pain, severe nausea/vomiting, or problems with urination or bowel movements unless otherwise stated above.  Pertinent History Reviewed:  Reviewed past medical,surgical, social, obstetrical and family history.  Reviewed problem list, medications and allergies. OB History  Gravida Para Term Preterm AB Living  5 1 0 1 3 1   SAB TAB Ectopic Multiple Live Births  2 0 1 0 1    # Outcome Date GA Lbr Len/2nd Weight Sex Delivery Anes PTL Lv  5 Current           4 Ectopic 06/2016          3 SAB 04/2016             Birth Comments: blighted ovum, GS 7429w1d, cytotec  2 Preterm 04/23/12 2576w4d  6 lb 9.6 oz (2.995 kg) M CS-LTranv EPI  LIV     Complications: Failure to Progress in First Stage, Preeclampsia  1 SAB 2013           Physical  Assessment:   Vitals:   12/14/17 1135  BP: 120/76  Pulse: 88  Weight: 154 lb (69.9 kg)  Body mass index is 27.28 kg/m.       Physical Examination:  General appearance - well appearing, and in no distress  Mental status - alert, oriented to person, place, and time  Psych:  She has a normal mood and affect  Skin - warm and dry, normal color, no suspicious lesions noted  Chest - effort normal, all lung fields clear to auscultation bilaterally  Heart - normal rate and regular rhythm  Abdomen - soft, nontender  Extremities:  No swelling or varicosities noted  Thin prep pap is not done  Fetal Heart Rate (bpm): +u/s via informal transabdominal u/s, w/ +active fetus  Results for orders placed or performed in visit on 12/14/17 (from the past 24 hour(s))  POC Urinalysis Dipstick OB   Collection Time: 12/14/17 11:34 AM  Result Value Ref Range   Color, UA     Clarity, UA     Glucose, UA Negative Negative   Bilirubin, UA     Ketones, UA neg    Spec Grav, UA     Blood, UA neg    pH, UA     POC,PROTEIN,UA Negative Negative, Trace, Small (1+), Moderate (2+), Large (3+), 4+   Urobilinogen, UA  Nitrite, UA neg    Leukocytes, UA Negative Negative   Appearance     Odor      Assessment & Plan:  1) High-Risk Pregnancy G5P0131 at [redacted]w[redacted]d with an Estimated Date of Delivery: 07/10/18   2) Initial OB visit  3) CHTN> off meds since 6/3 when she had bypass. 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 Will check CBC, ferritin, iron, vit b12, thiamine, folate, vit D, calcium per UTD  5) Prev c/s> d/t FTP @ 6.5cm after IOL for severe pre-e at 35wks, discussed VBAC, taking consent home to review  6) H/O severe pre-e> w/ 35wk delivery, get baseline labs today, ASA 162mg  @ 12wks  7) Traveling to [employer] [facility] discussed Zika precautions (no active outbreaks per [employer]).  8) Nausea> Bonjesta doesn't help, just makes her sleepy. Will try  phenergan  Meds:  Meds ordered this encounter  Medications  . promethazine (PHENERGAN) 25 MG tablet    Sig: Take 0.5-1 tablets (12.5-25 mg total) by mouth every 6 (six) hours as needed for nausea or vomiting.    Dispense:  30 tablet    Refill:  0    Order Specific Question:   Supervising Provider    Answer:   Lazaro Arms [[id number]]    Initial labs obtained, including pre-e labs and labs for h/o roux-en-y Continue prenatal vitamins Reviewed n/v relief measures and warning s/s to report Reviewed recommended weight gain based on pre-gravid BMI Encouraged well-balanced diet Genetic Screening discussed: wants MaterniT21 now, then nt/it Cystic fibrosis screening discussed requested Ultrasound discussed; fetal survey: requested CCNC completed>PCM not here  Follow-up: Return in about 3 weeks (around 01/04/2018) for HROB, US:NT+1stIT.   Orders Placed This Encounter  Procedures  . GC/Chlamydia Probe Amp  . Urine Culture  . US Fetal Nuchal Translucency Measurement  . Obstetric Panel, Including HIV  . Urinalysis, Routine w reflex microscopic  . Pain Management Screening Profile (10S)  . Cystic Fibrosis Mutation 97  . Comprehensive metabolic panel  . Protein / creatinine ratio, urine  . MaterniT21 PLUS Core  . Ferritin  . Iron  . Vitamin B1  . B12 and Folate Panel  . Vitamin D (25 hydroxy)  . POC Urinalysis Dipstick OB    Cheral Marker CNM, [facility] 12/14/2017 1:03 PM

## 2017-12-15 ENCOUNTER — Other Ambulatory Visit: Payer: Self-pay | Admitting: Women's Health

## 2017-12-15 DIAGNOSIS — Z23 Encounter for immunization: Secondary | ICD-10-CM | POA: Diagnosis not present

## 2017-12-15 LAB — URINALYSIS, ROUTINE W REFLEX MICROSCOPIC
Bilirubin, UA: NEGATIVE
Glucose, UA: NEGATIVE
KETONES UA: NEGATIVE
LEUKOCYTES UA: NEGATIVE
Nitrite, UA: NEGATIVE
PH UA: 5.5 (ref 5.0–7.5)
Protein, UA: NEGATIVE
RBC UA: NEGATIVE
Specific Gravity, UA: 1.027 (ref 1.005–1.030)
Urobilinogen, Ur: 0.2 mg/dL (ref 0.2–1.0)

## 2017-12-15 LAB — OBSTETRIC PANEL, INCLUDING HIV
Antibody Screen: NEGATIVE
Basophils Absolute: 0.1 10*3/uL (ref 0.0–0.2)
Basos: 1 %
EOS (ABSOLUTE): 0.1 10*3/uL (ref 0.0–0.4)
EOS: 2 %
HEMATOCRIT: 35.3 % (ref 34.0–46.6)
HEMOGLOBIN: 11.3 g/dL (ref 11.1–15.9)
HIV SCREEN 4TH GENERATION: NONREACTIVE
Hepatitis B Surface Ag: NEGATIVE
Immature Grans (Abs): 0 10*3/uL (ref 0.0–0.1)
Immature Granulocytes: 0 %
LYMPHS ABS: 2.5 10*3/uL (ref 0.7–3.1)
Lymphs: 31 %
MCH: 27 pg (ref 26.6–33.0)
MCHC: 32 g/dL (ref 31.5–35.7)
MCV: 84 fL (ref 79–97)
Monocytes Absolute: 0.5 10*3/uL (ref 0.1–0.9)
Monocytes: 6 %
NEUTROS ABS: 4.8 10*3/uL (ref 1.4–7.0)
Neutrophils: 60 %
Platelets: 306 10*3/uL (ref 150–450)
RBC: 4.18 x10E6/uL (ref 3.77–5.28)
RDW: 14.8 % (ref 12.3–15.4)
RH TYPE: POSITIVE
RPR: NONREACTIVE
RUBELLA: 4.12 {index} (ref >0.99)
WBC: 8.1 10*3/uL (ref 3.4–10.8)

## 2017-12-15 LAB — PMP SCREEN PROFILE (10S), URINE
AMPHETAMINE SCREEN URINE: NEGATIVE ng/mL
BARBITURATE SCREEN URINE: NEGATIVE ng/mL
BENZODIAZEPINE SCREEN, URINE: NEGATIVE ng/mL
CANNABINOIDS UR QL SCN: NEGATIVE ng/mL
Cocaine (Metab) Scrn, Ur: NEGATIVE ng/mL
Creatinine(Crt), U: 185.5 mg/dL (ref 20.0–300.0)
Methadone Screen, Urine: NEGATIVE ng/mL
OPIATE SCREEN URINE: NEGATIVE ng/mL
OXYCODONE+OXYMORPHONE UR QL SCN: NEGATIVE ng/mL
PHENCYCLIDINE QUANTITATIVE URINE: NEGATIVE ng/mL
Ph of Urine: 6 (ref 4.5–8.9)
Propoxyphene Scrn, Ur: NEGATIVE ng/mL

## 2017-12-15 LAB — PROTEIN / CREATININE RATIO, URINE
Creatinine, Urine: 164.5 mg/dL
Protein, Ur: 22.1 mg/dL
Protein/Creat Ratio: 134 mg/g creat (ref 0–200)

## 2017-12-15 LAB — COMPREHENSIVE METABOLIC PANEL
A/G RATIO: 1.6 (ref 1.2–2.2)
ALBUMIN: 4.1 g/dL (ref 3.5–5.5)
ALK PHOS: 49 IU/L (ref 39–117)
ALT: 13 IU/L (ref 0–32)
AST: 11 IU/L (ref 0–40)
BILIRUBIN TOTAL: 0.3 mg/dL (ref 0.0–1.2)
BUN / CREAT RATIO: 29 — AB (ref 9–23)
BUN: 12 mg/dL (ref 6–20)
CHLORIDE: 103 mmol/L (ref 96–106)
CO2: 20 mmol/L (ref 20–29)
CREATININE: 0.42 mg/dL — AB (ref 0.57–1.00)
Calcium: 8.9 mg/dL (ref 8.7–10.2)
GFR calc Af Amer: 160 mL/min/{1.73_m2} (ref >59)
GFR calc non Af Amer: 139 mL/min/{1.73_m2} (ref >59)
GLOBULIN, TOTAL: 2.6 g/dL (ref 1.5–4.5)
Glucose: 77 mg/dL (ref 65–99)
POTASSIUM: 4.2 mmol/L (ref 3.5–5.2)
SODIUM: 135 mmol/L (ref 134–144)
Total Protein: 6.7 g/dL (ref 6.0–8.5)

## 2017-12-16 LAB — URINE CULTURE: ORGANISM ID, BACTERIA: NO GROWTH

## 2017-12-16 LAB — GC/CHLAMYDIA PROBE AMP
CHLAMYDIA, DNA PROBE: NEGATIVE
Neisseria gonorrhoeae by PCR: NEGATIVE

## 2017-12-17 ENCOUNTER — Other Ambulatory Visit: Payer: Self-pay | Admitting: Women's Health

## 2017-12-17 ENCOUNTER — Encounter: Payer: Self-pay | Admitting: Women's Health

## 2017-12-17 DIAGNOSIS — R7989 Other specified abnormal findings of blood chemistry: Secondary | ICD-10-CM | POA: Insufficient documentation

## 2017-12-17 MED ORDER — OMEPRAZOLE 20 MG PO CPDR: 20.0000 mg | DELAYED_RELEASE_CAPSULE | Freq: Every day | ORAL | 6 refills | Status: DC

## 2017-12-18 LAB — FERRITIN: FERRITIN: 12 ng/mL — AB (ref 15–150)

## 2017-12-18 LAB — VITAMIN B1: Thiamine: 121.3 nmol/L (ref 66.5–200.0)

## 2017-12-18 LAB — IRON: Iron: 51 ug/dL (ref 27–159)

## 2017-12-18 LAB — VITAMIN D 25 HYDROXY (VIT D DEFICIENCY, FRACTURES): Vit D, 25-Hydroxy: 24.4 ng/mL — ABNORMAL LOW (ref 30.0–100.0)

## 2017-12-18 LAB — B12 AND FOLATE PANEL
FOLATE: 11.1 ng/mL (ref >3.0)
VITAMIN B 12: 289 pg/mL (ref 232–1245)

## 2017-12-19 LAB — MATERNIT 21 PLUS CORE, BLOOD
CHROMOSOME 13: NEGATIVE
CHROMOSOME 21: NEGATIVE
Chromosome 18: NEGATIVE
Fetal Fraction: 6
Y Chromosome: DETECTED

## 2017-12-19 LAB — CYSTIC FIBROSIS MUTATION 97: Interpretation: NOT DETECTED

## 2018-01-05 ENCOUNTER — Other Ambulatory Visit: Payer: Self-pay

## 2018-01-05 ENCOUNTER — Ambulatory Visit (INDEPENDENT_AMBULATORY_CARE_PROVIDER_SITE_OTHER): Payer: BLUE CROSS/BLUE SHIELD | Admitting: Advanced Practice Midwife

## 2018-01-05 ENCOUNTER — Encounter: Payer: Self-pay | Admitting: Advanced Practice Midwife

## 2018-01-05 ENCOUNTER — Ambulatory Visit (INDEPENDENT_AMBULATORY_CARE_PROVIDER_SITE_OTHER): Payer: BLUE CROSS/BLUE SHIELD

## 2018-01-05 VITALS — BP 126/64 | HR 90 | Wt 159.5 lb

## 2018-01-05 DIAGNOSIS — O0991 Supervision of high risk pregnancy, unspecified, first trimester: Secondary | ICD-10-CM

## 2018-01-05 DIAGNOSIS — Z3682 Encounter for antenatal screening for nuchal translucency: Secondary | ICD-10-CM

## 2018-01-05 DIAGNOSIS — Z1389 Encounter for screening for other disorder: Secondary | ICD-10-CM

## 2018-01-05 DIAGNOSIS — O10911 Unspecified pre-existing hypertension complicating pregnancy, first trimester: Secondary | ICD-10-CM

## 2018-01-05 DIAGNOSIS — Z3A13 13 weeks gestation of pregnancy: Secondary | ICD-10-CM

## 2018-01-05 DIAGNOSIS — Z331 Pregnant state, incidental: Secondary | ICD-10-CM

## 2018-01-05 DIAGNOSIS — O0990 Supervision of high risk pregnancy, unspecified, unspecified trimester: Secondary | ICD-10-CM

## 2018-01-05 LAB — POCT URINALYSIS DIPSTICK OB
Glucose, UA: NEGATIVE
Ketones, UA: NEGATIVE
Leukocytes, UA: NEGATIVE
Nitrite, UA: NEGATIVE
POC,PROTEIN,UA: NEGATIVE

## 2018-01-05 NOTE — Progress Notes (Signed)
US 13+3 wks,measurements c/w dates,crl 73.63 mm,normal ovaries bilat,fhr 169 bpm, NB present ,NT 1.7 mm

## 2018-01-05 NOTE — Progress Notes (Signed)
HIGH-RISK PREGNANCY VISIT Patient name: Alexis Montgomery MRN [id number]  Date of birth: 04/03/1990 Chief Complaint:   High Risk Gestation (NT today)  History of Present Illness:   Alexis Montgomery is a 27 y.o. [phone] female at 3463w3d with an Estimated Date of Delivery: 07/10/18 being seen today for ongoing management of a high-risk pregnancy complicated by chronic HTN. No meds .  . Vag. Bleeding: None.   . denies leaking of fluid. Has had some dizziness w rapid heart rate, mainly in the am since going back to work recently.  Review of Systems:   Pertinent items are noted in HPI Denies abnormal vaginal discharge w/ itching/odor/irritation, headaches, visual changes, shortness of breath, chest pain, abdominal pain, severe nausea/vomiting, or problems with urination or bowel movements unless otherwise stated above.    Pertinent History Reviewed:  Medical & Surgical Hx:   Past Medical History:  Diagnosis Date  . Anemia   . Benign essential HTN 10/09/2014  . Hidradenitis suppurativa 10/09/2014  . Menorrhagia with irregular cycle 06/11/2015  . Morbid obesity (HCC) 10/09/2014  . MRSA infection 10/09/2014  . Pregnancy induced hypertension    Past Surgical History:  Procedure Laterality Date  . CESAREAN SECTION N/A 04/23/2012   Procedure: CESAREAN SECTION;  Surgeon: Tereso NewcomerUgonna A Anyanwu, MD;  Location: [facility];  Service: Obstetrics;  Laterality: N/A;  . DILATION AND CURETTAGE OF UTERUS N/A 07/07/2016   Procedure: SUCTION DILATATION AND CURETTAGE;  Surgeon: Tilda BurrowFerguson, John V, MD;  Location: [facility];  Service: Gynecology;  Laterality: N/A;  . LAPAROSCOPIC ROUX-EN-Y GASTRIC BYPASS WITH UPPER ENDOSCOPY AND REMOVAL OF LAP BAND  06/08/2017   Family History  Problem Relation Age of Onset  . HIV Mother   . Cancer Mother   . Heart Problems Paternal Grandfather   . Diabetes Paternal Uncle     Current Outpatient Medications:  .  Doxylamine-Pyridoxine ER (BONJESTA) 20-20 MG TBCR, Take 1 tablet by mouth at bedtime. Can  add 1 tablet in the morning if needed for nausea and vomiting, Disp: 60 tablet, Rfl: 8 .  omeprazole (PRILOSEC) 20 MG capsule, Take 1 capsule (20 mg total) by mouth daily., Disp: 30 capsule, Rfl: 6 .  Prenatal Vit-Fe Fumarate-FA (PRENATAL VITAMIN PO), Take by mouth daily., Disp: , Rfl:  .  progesterone (PROMETRIUM) 200 MG capsule, Place in 1 vagina at bedtime, Disp: 30 capsule, Rfl: 3 .  promethazine (PHENERGAN) 25 MG tablet, Take 0.5-1 tablets (12.5-25 mg total) by mouth every 6 (six) hours as needed for nausea or vomiting., Disp: 30 tablet, Rfl: 0 Social History: Reviewed -  reports that she has never smoked. She has never used smokeless tobacco.   Physical Assessment:   Vitals:   01/05/18 1409  BP: 126/64  Pulse: 90  Weight: 159 lb 8 oz (72.3 kg)  Body mass index is 28.25 kg/m.           Physical Examination:   General appearance: alert, well appearing, and in no distress  Mental status: alert, oriented to person, place, and time  Skin: warm & dry   Extremities: Edema: None    Cardiovascular: normal heart rate noted  Respiratory: normal respiratory effort, no distress  Abdomen: gravid, soft, non-tender  Pelvic: Cervical exam deferred                 Fetal Surveillance Testing today: US 13+3 wks,measurements c/w dates,crl 73.63 mm,normal ovaries bilat,fhr 169 bpm, NB present ,NT 1.7 mm  Results for orders placed or performed in visit  on 01/05/18 (from the past 24 hour(s))  POC Urinalysis Dipstick OB   Collection Time: 01/05/18  2:09 PM  Result Value Ref Range   Color, UA     Clarity, UA     Glucose, UA Negative Negative   Bilirubin, UA     Ketones, UA neg    Spec Grav, UA     Blood, UA trace    pH, UA     POC,PROTEIN,UA Negative Negative, Trace, Small (1+), Moderate (2+), Large (3+), 4+   Urobilinogen, UA     Nitrite, UA neg    Leukocytes, UA Negative Negative   Appearance     Odor      Assessment & Plan:  1) High-risk pregnancy [id number] at 3759w3d with an Estimated  Date of Delivery: 07/10/18   2) CHTN, no meds, .  Treatment Plan Baby ASA 162mg    Growth u/s @ 20, 24, 28, 33, 36wks     2x/wk testing nst/sono @ 36wks or weekly BPP   Deliver @ 39-40wks:______   3) Gastric bypass, .  Labs/procedures today: NT/IT   Follow-up: Return in about 4 weeks (around 02/02/2018) for HROB, 2nd IT.  No future appointments.  Orders Placed This Encounter  Procedures  . Integrated 1  . POC Urinalysis Dipstick OB   Jacklyn ShellFrances Cresenzo-Dishmon CNM 01/05/2018 2:12 PM

## 2018-01-05 NOTE — Patient Instructions (Signed)
Vagal Maneuver Techniques  There are different types of vagal maneuvers that can be used to slow a person's heart rate. There is not one maneuver which will work for all patients. In some instances, it may take a little trial and error to determine what technique works best for an individual patient. All of the procedures or techniques below may stimulate the vagal nerve.  Bearing Down: Bearing down, which medically is referred to as the Valsalva maneuver, is one of the most common ways to stimulate the vagus nerve. The patient is instructed to bear down as if they were having a bowel movement. In effect, the patient is expiring against a closed glottis. An alternative way to perform a Valsalva maneuver is to tell the patient to blow through an occluded straw for several seconds (Hold one end closed and blow through the other). These maneuvers increase intrathoracic pressure and stimulate the vagus nerve.  Coughing: Coughing creates the same physiological response as bearing down, but some people may find it easier to perform. The cough must be forceful and sustained i.e. a single cough will likely not be effective in terminating an arrhythmia  Cold Stimulus to the Face: This technique involves emerging the face in ice cold water. Alternative methods include placing on icepack on the face or a washcloth soaked in ice water. The cold stimuli to the face should last about 10 seconds. This type of vagal maneuver creates a physiological response similar to that which occurs if a person is submerged in cold water (diver's reflex).  Gagging: Although it may not sound pleasant, gagging also stimulates the vagus nerve and can stop an episode of SVT.  A tongue depressor is briefly inserted into the mouth, touching the back of the throat, which causes the person to reflexively gag. The gag reflex stimulates the vagus nerve.    

## 2018-01-06 HISTORY — PX: FOOT SURGERY: SHX648

## 2018-01-08 ENCOUNTER — Other Ambulatory Visit: Payer: Self-pay | Admitting: Women's Health

## 2018-01-08 LAB — INTEGRATED 1
Crown Rump Length: 73.6 mm
Gest. Age on Collection Date: 13.3 weeks
Maternal Age at EDD: 29.6 yr
Nuchal Translucency (NT): 1.7 mm
Number of Fetuses: 1
PAPP-A VALUE: 1630.5 ng/mL
Weight: 160 [lb_av]

## 2018-01-26 ENCOUNTER — Telehealth: Payer: Self-pay | Admitting: *Deleted

## 2018-01-26 ENCOUNTER — Other Ambulatory Visit: Payer: Self-pay

## 2018-01-26 ENCOUNTER — Encounter: Payer: Self-pay | Admitting: Obstetrics & Gynecology

## 2018-01-26 ENCOUNTER — Ambulatory Visit (INDEPENDENT_AMBULATORY_CARE_PROVIDER_SITE_OTHER): Payer: BLUE CROSS/BLUE SHIELD | Admitting: Obstetrics & Gynecology

## 2018-01-26 VITALS — BP 124/75 | HR 90 | Wt 157.0 lb

## 2018-01-26 DIAGNOSIS — Z1379 Encounter for other screening for genetic and chromosomal anomalies: Secondary | ICD-10-CM

## 2018-01-26 DIAGNOSIS — Z3A16 16 weeks gestation of pregnancy: Secondary | ICD-10-CM

## 2018-01-26 DIAGNOSIS — Z1389 Encounter for screening for other disorder: Secondary | ICD-10-CM

## 2018-01-26 DIAGNOSIS — R42 Dizziness and giddiness: Secondary | ICD-10-CM | POA: Diagnosis not present

## 2018-01-26 DIAGNOSIS — O0992 Supervision of high risk pregnancy, unspecified, second trimester: Secondary | ICD-10-CM

## 2018-01-26 DIAGNOSIS — Z331 Pregnant state, incidental: Secondary | ICD-10-CM

## 2018-01-26 LAB — POCT URINALYSIS DIPSTICK OB
GLUCOSE, UA: NEGATIVE
Leukocytes, UA: NEGATIVE
Nitrite, UA: NEGATIVE
POC,PROTEIN,UA: NEGATIVE
RBC UA: NEGATIVE

## 2018-01-26 LAB — POCT HEMOGLOBIN: Hemoglobin: 9.8 g/dL — AB (ref 11–14.6)

## 2018-01-26 MED ORDER — FERROUS SULFATE 220 (44 FE) MG/5ML PO ELIX: 220.0000 mg | ORAL_SOLUTION | Freq: Every day | ORAL | 6 refills | Status: DC

## 2018-01-26 MED ORDER — PANTOPRAZOLE SODIUM 40 MG PO TBEC: 40.0000 mg | DELAYED_RELEASE_TABLET | Freq: Every day | ORAL | 4 refills | Status: DC

## 2018-01-26 NOTE — Telephone Encounter (Signed)
Pt called with symptoms and would feel better if she was seen. Appt given for this afternoon. JSY

## 2018-01-26 NOTE — Progress Notes (Signed)
LOW-RISK PREGNANCY VISIT Patient name: Alexis Montgomery MRN [id number]  Date of birth: [birth year] Chief Complaint:   High Risk Gestation (2nd IT; c/o dizziness & pain in upper abd)  History of Present Illness:   Alexis Montgomery is a 28 y.o. [phone] female at [redacted]w[redacted]d with an Estimated Date of Delivery: 07/10/18 being seen today for ongoing management of a low-risk pregnancy.  Today she reports nausea vomiting .  . Vag. Bleeding: None.   . denies leaking of fluid. Review of Systems:   Pertinent items are noted in HPI Denies abnormal vaginal discharge w/ itching/odor/irritation, headaches, visual changes, shortness of breath, chest pain, abdominal pain, severe nausea/vomiting, or problems with urination or bowel movements unless otherwise stated above. Pertinent History Reviewed:  Reviewed past medical,surgical, social, obstetrical and family history.  Reviewed problem list, medications and allergies. Physical Assessment:   Vitals:   01/26/18 1436  BP: 124/75  Pulse: 90  Weight: 157 lb (71.2 kg)  Body mass index is 27.81 kg/m.        Physical Examination:   General appearance: Well appearing, and in no distress  Mental status: Alert, oriented to person, place, and time  Skin: Warm & dry  Cardiovascular: Normal heart rate noted  Respiratory: Normal respiratory effort, no distress  Abdomen: Soft, gravid, nontender  Pelvic: Cervical exam deferred         Extremities: Edema: None  Fetal Status:          Results for orders placed or performed in visit on 01/26/18 (from the past 24 hour(s))  POC Urinalysis Dipstick OB   Collection Time: 01/26/18  2:36 PM  Result Value Ref Range   Color, UA     Clarity, UA     Glucose, UA Negative Negative   Bilirubin, UA     Ketones, UA large    Spec Grav, UA     Blood, UA neg    pH, UA     POC,PROTEIN,UA Negative Negative, Trace, Small (1+), Moderate (2+), Large (3+), 4+   Urobilinogen, UA     Nitrite, UA neg    Leukocytes, UA Negative Negative   Appearance     Odor    POCT hemoglobin   Collection Time: 01/26/18  2:50 PM  Result Value Ref Range   Hemoglobin 9.8 (A) 11 - 14.6 g/dL    Assessment & Plan:  1) Low-risk pregnancy [id number] at [redacted]w[redacted]d with an Estimated Date of Delivery: 07/10/18   2) GERD, due to Roux en Y, and pregnancy, switch to protonix   Meds:  Meds ordered this encounter  Medications  . pantoprazole (PROTONIX) 40 MG tablet    Sig: Take 1 tablet (40 mg total) by mouth daily.    Dispense:  30 tablet    Refill:  4  . ferrous sulfate 220 (44 Fe) MG/5ML solution    Sig: Take 5 mLs (220 mg total) by mouth daily.    Dispense:  473 mL    Refill:  6   Labs/procedures today:   Plan:  Continue routine obstetrical care   Reviewed:  labor symptoms and general obstetric precautions including but not limited to vaginal bleeding, contractions, leaking of fluid and fetal movement were reviewed in detail with the patient.  All questions were answered  Follow-up: Return in about 3 weeks (around 02/16/2018) for 20 week sono, LROB.  Orders Placed This Encounter  Procedures  . US OB Comp + 14 Wk  . INTEGRATED 2  . POC Urinalysis Dipstick OB  .  POCT hemoglobin   Lazaro Arms  01/26/2018 3:22 PM

## 2018-01-28 LAB — INTEGRATED 2
AFP MoM: 1.41
Alpha-Fetoprotein: 43.9 ng/mL
Crown Rump Length: 73.6 mm
DIA MoM: 0.82
DIA Value: 130.8 pg/mL
ESTRIOL UNCONJUGATED: 0.79 ng/mL
Gest. Age on Collection Date: 13.3 weeks
Gestational Age: 16.3 weeks
HCG VALUE: 68.1 [IU]/mL
Maternal Age at EDD: 29.6 yr
NUCHAL TRANSLUCENCY MOM: 1.01
Nuchal Translucency (NT): 1.7 mm
Number of Fetuses: 1
PAPP-A MoM: 1.39
PAPP-A Value: 1630.5 ng/mL
Test Results:: NEGATIVE
Weight: 160 [lb_av]
Weight: 160 [lb_av]
hCG MoM: 2.06
uE3 MoM: 0.87

## 2018-02-01 ENCOUNTER — Encounter: Payer: BLUE CROSS/BLUE SHIELD | Admitting: Women's Health

## 2018-02-08 ENCOUNTER — Telehealth: Payer: Self-pay | Admitting: Obstetrics & Gynecology

## 2018-02-08 NOTE — Telephone Encounter (Signed)
Can she not take the tablet I guess, if not I don't have another solution, maye pour it into her food apple sauce

## 2018-02-08 NOTE — Telephone Encounter (Signed)
Pt advised to try iron with apple sauce. Pt voiced understanding. Pt states Dr. Despina Hidden recommended liquid iron due to her having gastric bypass. JSY

## 2018-02-08 NOTE — Telephone Encounter (Signed)
Spoke with pt. Pt was started on liquid iron and she vomits it up every time she takes it. Please advise. Thanks!! JSY

## 2018-02-08 NOTE — Telephone Encounter (Signed)
Has a question about meds she is taking making her sick

## 2018-02-15 ENCOUNTER — Other Ambulatory Visit: Payer: Self-pay | Admitting: Obstetrics & Gynecology

## 2018-02-15 ENCOUNTER — Ambulatory Visit (INDEPENDENT_AMBULATORY_CARE_PROVIDER_SITE_OTHER): Payer: BLUE CROSS/BLUE SHIELD

## 2018-02-15 ENCOUNTER — Ambulatory Visit (INDEPENDENT_AMBULATORY_CARE_PROVIDER_SITE_OTHER): Payer: BLUE CROSS/BLUE SHIELD | Admitting: Women's Health

## 2018-02-15 ENCOUNTER — Encounter: Payer: Self-pay | Admitting: Women's Health

## 2018-02-15 VITALS — BP 123/78 | HR 82 | Wt 160.0 lb

## 2018-02-15 DIAGNOSIS — Z1389 Encounter for screening for other disorder: Secondary | ICD-10-CM

## 2018-02-15 DIAGNOSIS — Z3482 Encounter for supervision of other normal pregnancy, second trimester: Secondary | ICD-10-CM

## 2018-02-15 DIAGNOSIS — Z363 Encounter for antenatal screening for malformations: Secondary | ICD-10-CM

## 2018-02-15 DIAGNOSIS — Z9884 Bariatric surgery status: Secondary | ICD-10-CM

## 2018-02-15 DIAGNOSIS — Z98891 History of uterine scar from previous surgery: Secondary | ICD-10-CM

## 2018-02-15 DIAGNOSIS — O99012 Anemia complicating pregnancy, second trimester: Secondary | ICD-10-CM | POA: Diagnosis not present

## 2018-02-15 DIAGNOSIS — O0992 Supervision of high risk pregnancy, unspecified, second trimester: Secondary | ICD-10-CM

## 2018-02-15 DIAGNOSIS — Z331 Pregnant state, incidental: Secondary | ICD-10-CM

## 2018-02-15 DIAGNOSIS — Z8679 Personal history of other diseases of the circulatory system: Secondary | ICD-10-CM

## 2018-02-15 DIAGNOSIS — Z3A19 19 weeks gestation of pregnancy: Secondary | ICD-10-CM

## 2018-02-15 DIAGNOSIS — O0990 Supervision of high risk pregnancy, unspecified, unspecified trimester: Secondary | ICD-10-CM

## 2018-02-15 LAB — POCT HEMOGLOBIN: Hemoglobin: 10.9 g/dL — AB (ref 11–14.6)

## 2018-02-15 LAB — POCT URINALYSIS DIPSTICK OB
Blood, UA: NEGATIVE
Glucose, UA: NEGATIVE
Ketones, UA: NEGATIVE
LEUKOCYTES UA: NEGATIVE
Nitrite, UA: NEGATIVE
POC,PROTEIN,UA: NEGATIVE

## 2018-02-15 NOTE — Progress Notes (Signed)
[place] 19+2 wks,cephalic,anterior placenta gr 0,normal ovaries bilat,cx 3.8 cm,svp of fluid 4.7 cm,efw 271 g 31%,anatomy complete,no obvious abnormalities

## 2018-02-15 NOTE — Progress Notes (Signed)
LOW-RISK PREGNANCY VISIT Patient name: Alexis Montgomery MRN [id number]  Date of birth: 03/07/1990 Chief Complaint:   High Risk Gestation (US today)  History of Present Illness:   Alexis Montgomery is a 28 y.o. [phone] female at 2438w2d with an Estimated Date of Delivery: 07/10/18 being seen today for ongoing management of a low-risk pregnancy.  Today she reports unable to keep Fe down, has tried mixing in apple sauce, as soon as she tastes it, she vomits. H/O CHTN- resolved after Roux-en-Y. H/O pre-e, hasn't started asa yet. Prev c/s- wants RCS.  Contractions: Not present. Vag. Bleeding: None.  Movement: Present. denies leaking of fluid. Review of Systems:   Pertinent items are noted in HPI Denies abnormal vaginal discharge w/ itching/odor/irritation, headaches, visual changes, shortness of breath, chest pain, abdominal pain, severe nausea/vomiting, or problems with urination or bowel movements unless otherwise stated above. Pertinent History Reviewed:  Reviewed past medical,surgical, social, obstetrical and family history.  Reviewed problem list, medications and allergies. Physical Assessment:   Vitals:   02/15/18 1030  BP: 123/78  Pulse: 82  Weight: 160 lb (72.6 kg)  Body mass index is 28.34 kg/m.        Physical Examination:   General appearance: Well appearing, and in no distress  Mental status: Alert, oriented to person, place, and time  Skin: Warm & dry  Cardiovascular: Normal heart rate noted  Respiratory: Normal respiratory effort, no distress  Abdomen: Soft, gravid, nontender  Pelvic: Cervical exam deferred         Extremities: Edema: Trace  Fetal Status: Fetal Heart Rate (bpm): 152 u/s   Movement: Present    US 19+2 wks,cephalic,anterior placenta gr 0,normal ovaries bilat,cx 3.8 cm,svp of fluid 4.7 cm,efw 271 g 31%,anatomy complete,no obvious abnormalities     Results for orders placed or performed in visit on 02/15/18 (from the past 24 hour(s))  POC Urinalysis Dipstick OB   Collection Time: 02/15/18 10:31 AM  Result Value Ref Range   Color, UA     Clarity, UA     Glucose, UA Negative Negative   Bilirubin, UA     Ketones, UA neg    Spec Grav, UA     Blood, UA neg    pH, UA     POC,PROTEIN,UA Negative Negative, Trace, Small (1+), Moderate (2+), Large (3+), 4+   Urobilinogen, UA     Nitrite, UA neg    Leukocytes, UA Negative Negative   Appearance     Odor    POCT hemoglobin   Collection Time: 02/15/18 11:03 AM  Result Value Ref Range   Hemoglobin 10.9 (A) 11 - 14.6 g/dL    Assessment & Plan:  1) Low-risk pregnancy [id number] at 1538w2d with an Estimated Date of Delivery: 07/10/18   2) H/O CHTN, resolved after Roux-en-Y, continue to monitor, will treat as low-risk for now  3) H/O severe pre-e> start 162mg  ASA today  4) Mild anemia resolved> was 9.8 2wks ago, now 10.9 (normal for 2nd trimester), gave printed info on iron-rich foods  5) Prev c/s> wants RCS   Meds: No orders of the defined types were placed in this encounter.  Labs/procedures today: anatomy u/s, hgb  Plan:  Continue routine obstetrical care   Reviewed: Preterm labor symptoms and general obstetric precautions including but not limited to vaginal bleeding, contractions, leaking of fluid and fetal movement were reviewed in detail with the patient.  All questions were answered  Follow-up: Return in about 4 weeks (around 03/15/2018) for LROB.  Orders Placed This Encounter  Procedures  . POC Urinalysis Dipstick OB  . POCT hemoglobin   Cheral Marker CNM, [facility] 02/15/2018 11:06 AM

## 2018-02-15 NOTE — Patient Instructions (Addendum)
Begin taking 162mg  (two 81mg  tablets) to decrease risk of preeclampsia during pregnancy     Alexis Montgomery, I greatly value your feedback.  If you receive a survey following your visit with us today, we appreciate you taking the time to fill it out.  Thanks, Joellyn HaffKim Ahmani Daoud, CNM, WHNP-BC   Second Trimester of Pregnancy The second trimester is from week 14 through week 27 (months 4 through 6). The second trimester is often a time when you feel your best. Your body has adjusted to being pregnant, and you begin to feel better physically. Usually, morning sickness has lessened or quit completely, you may have more energy, and you may have an increase in appetite. The second trimester is also a time when the fetus is growing rapidly. At the end of the sixth month, the fetus is about 9 inches long and weighs about 1 pounds. You will likely begin to feel the baby move (quickening) between 16 and 20 weeks of pregnancy. Body changes during your second trimester Your body continues to go through many changes during your second trimester. The changes vary from woman to woman.  Your weight will continue to increase. You will notice your lower abdomen bulging out.  You may begin to get stretch marks on your hips, abdomen, and breasts.  You may develop headaches that can be relieved by medicines. The medicines should be approved by your health care provider.  You may urinate more often because the fetus is pressing on your bladder.  You may develop or continue to have heartburn as a result of your pregnancy.  You may develop constipation because certain hormones are causing the muscles that push waste through your intestines to slow down.  You may develop hemorrhoids or swollen, bulging veins (varicose veins).  You may have back pain. This is caused by: ? Weight gain. ? Pregnancy hormones that are relaxing the joints in your pelvis. ? A shift in weight and the muscles that support your balance.  Your  breasts will continue to grow and they will continue to become tender.  Your gums may bleed and may be sensitive to brushing and flossing.  Dark spots or blotches (chloasma, mask of pregnancy) may develop on your face. This will likely fade after the baby is born.  A dark line from your belly button to the pubic area (linea nigra) may appear. This will likely fade after the baby is born.  You may have changes in your hair. These can include thickening of your hair, rapid growth, and changes in texture. Some women also have hair loss during or after pregnancy, or hair that feels dry or thin. Your hair will most likely return to normal after your baby is born.  What to expect at prenatal visits During a routine prenatal visit:  You will be weighed to make sure you and the fetus are growing normally.  Your blood pressure will be taken.  Your abdomen will be measured to track your baby's growth.  The fetal heartbeat will be listened to.  Any test results from the previous visit will be discussed.  Your health care provider may ask you:  How you are feeling.  If you are feeling the baby move.  If you have had any abnormal symptoms, such as leaking fluid, bleeding, severe headaches, or abdominal cramping.  If you are using any tobacco products, including cigarettes, chewing tobacco, and electronic cigarettes.  If you have any questions.  Other tests that may be performed during  your second trimester include:  Blood tests that check for: ? Low iron levels (anemia). ? High blood sugar that affects pregnant women (gestational diabetes) between 1324 and 28 weeks. ? Rh antibodies. This is to check for a protein on red blood cells (Rh factor).  Urine tests to check for infections, diabetes, or protein in the urine.  An ultrasound to confirm the proper growth and development of the baby.  An amniocentesis to check for possible genetic problems.  Fetal screens for spina bifida and  Down syndrome.  HIV (human immunodeficiency virus) testing. Routine prenatal testing includes screening for HIV, unless you choose not to have this test.  Follow these instructions at home: Medicines  Follow your health care provider's instructions regarding medicine use. Specific medicines may be either safe or unsafe to take during pregnancy.  Take a prenatal vitamin that contains at least 600 micrograms (mcg) of folic acid.  If you develop constipation, try taking a stool softener if your health care provider approves. Eating and drinking  Eat a balanced diet that includes fresh fruits and vegetables, whole grains, good sources of protein such as meat, eggs, or tofu, and low-fat dairy. Your health care provider will help you determine the amount of weight gain that is right for you.  Avoid raw meat and uncooked cheese. These carry germs that can cause birth defects in the baby.  If you have low calcium intake from food, talk to your health care provider about whether you should take a daily calcium supplement.  Limit foods that are high in fat and processed sugars, such as fried and sweet foods.  To prevent constipation: ? Drink enough fluid to keep your urine clear or pale yellow. ? Eat foods that are high in fiber, such as fresh fruits and vegetables, whole grains, and beans. Activity  Exercise only as directed by your health care provider. Most women can continue their usual exercise routine during pregnancy. Try to exercise for 30 minutes at least 5 days a week. Stop exercising if you experience uterine contractions.  Avoid heavy lifting, wear low heel shoes, and practice good posture.  A sexual relationship may be continued unless your health care provider directs you otherwise. Relieving pain and discomfort  Wear a good support bra to prevent discomfort from breast tenderness.  Take warm sitz baths to soothe any pain or discomfort caused by hemorrhoids. Use hemorrhoid  cream if your health care provider approves.  Rest with your legs elevated if you have leg cramps or low back pain.  If you develop varicose veins, wear support hose. Elevate your feet for 15 minutes, 3-4 times a day. Limit salt in your diet. Prenatal Care  Write down your questions. Take them to your prenatal visits.  Keep all your prenatal visits as told by your health care provider. This is important. Safety  Wear your seat belt at all times when driving.  Make a list of emergency phone numbers, including numbers for family, friends, the hospital, and police and fire departments. General instructions  Ask your health care provider for a referral to a local prenatal education class. Begin classes no later than the beginning of month 6 of your pregnancy.  Ask for help if you have counseling or nutritional needs during pregnancy. Your health care provider can offer advice or refer you to specialists for help with various needs.  Do not use hot tubs, steam rooms, or saunas.  Do not douche or use tampons or scented sanitary pads.  Do not cross your legs for long periods of time.  Avoid cat litter boxes and soil used by cats. These carry germs that can cause birth defects in the baby and possibly loss of the fetus by miscarriage or stillbirth.  Avoid all smoking, herbs, alcohol, and unprescribed drugs. Chemicals in these products can affect the formation and growth of the baby.  Do not use any products that contain nicotine or tobacco, such as cigarettes and e-cigarettes. If you need help quitting, ask your health care provider.  Visit your dentist if you have not gone yet during your pregnancy. Use a soft toothbrush to brush your teeth and be gentle when you floss. Contact a health care provider if:  You have dizziness.  You have mild pelvic cramps, pelvic pressure, or nagging pain in the abdominal area.  You have persistent nausea, vomiting, or diarrhea.  You have a bad  smelling vaginal discharge.  You have pain when you urinate. Get help right away if:  You have a fever.  You are leaking fluid from your vagina.  You have spotting or bleeding from your vagina.  You have severe abdominal cramping or pain.  You have rapid weight gain or weight loss.  You have shortness of breath with chest pain.  You notice sudden or extreme swelling of your face, hands, ankles, feet, or legs.  You have not felt your baby move in over an hour.  You have severe headaches that do not go away when you take medicine.  You have vision changes. Summary  The second trimester is from week 14 through week 27 (months 4 through 6). It is also a time when the fetus is growing rapidly.  Your body goes through many changes during pregnancy. The changes vary from woman to woman.  Avoid all smoking, herbs, alcohol, and unprescribed drugs. These chemicals affect the formation and growth your baby.  Do not use any tobacco products, such as cigarettes, chewing tobacco, and e-cigarettes. If you need help quitting, ask your health care provider.  Contact your health care provider if you have any questions. Keep all prenatal visits as told by your health care provider. This is important. This information is not intended to replace advice given to you by your health care provider. Make sure you discuss any questions you have with your health care provider. Document Released: 12/17/2000 Document Revised: 05/31/2015 Document Reviewed: 02/24/2012 Elsevier Interactive Patient Education  2017 [employer]     Iron-Rich Diet  Iron is a mineral that helps your body to produce hemoglobin. Hemoglobin is a protein in red blood cells that carries oxygen to your body's tissues. Eating too little iron may cause you to feel weak and tired, and it can increase your risk of infection. Iron is naturally found in many foods, and many foods have iron added to them (iron-fortified foods). You may  need to follow an iron-rich diet if you do not have enough iron in your body due to certain medical conditions. The amount of iron that you need each day depends on your age, your sex, and any medical conditions you have. Follow instructions from your health care provider or a diet and nutrition specialist (dietitian) about how much iron you should eat each day. What are tips for following this plan? Reading food labels  Check food labels to see how many milligrams (mg) of iron are in each serving. Cooking  Cook foods in pots and pans that are made from iron.  Take these  steps to make it easier for your body to absorb iron from certain foods: ? Soak beans overnight before cooking. ? Soak whole grains overnight and drain them before using. ? Ferment flours before baking, such as by using yeast in bread dough. Meal planning  When you eat foods that contain iron, you should eat them with foods that are high in vitamin C. These include oranges, peppers, tomatoes, potatoes, and mango. Vitamin C helps your body to absorb iron. General information  Take iron supplements only as told by your health care provider. An overdose of iron can be life-threatening. If you were prescribed iron supplements, take them with orange juice or a vitamin C supplement.  When you eat iron-fortified foods or take an iron supplement, you should also eat foods that naturally contain iron, such as meat, poultry, and fish. Eating naturally iron-rich foods helps your body to absorb the iron that is added to other foods or contained in a supplement.  Certain foods and drinks prevent your body from absorbing iron properly. Avoid eating these foods in the same meal as iron-rich foods or with iron supplements. These foods include: ? Coffee, black tea, and red wine. ? Milk, dairy products, and foods that are high in calcium. ? Beans and soybeans. ? Whole grains. What foods should I eat? Fruits Prunes. Raisins. Eat fruits  high in vitamin C, such as oranges, grapefruits, and strawberries, alongside iron-rich foods. Vegetables Spinach (cooked). Green peas. Broccoli. Fermented vegetables. Eat vegetables high in vitamin C, such as leafy greens, potatoes, bell peppers, and tomatoes, alongside iron-rich foods. Grains Iron-fortified breakfast cereal. Iron-fortified whole-wheat bread. Enriched rice. Sprouted grains. Meats and other proteins Beef liver. Oysters. Beef. Shrimp. [place]. Chicken. Tuna. Sardines. Chickpeas. Nuts. Tofu. Pumpkin seeds. Beverages Tomato juice. Fresh orange juice. Prune juice. Hibiscus tea. Fortified instant breakfast shakes. Sweets and desserts Blackstrap molasses. Seasonings and condiments Tahini. Fermented soy sauce. Other foods Wheat germ. The items listed above may not be a complete list of recommended foods and beverages. Contact a dietitian for more information. What foods should I avoid? Grains Whole grains. Bran cereal. Bran flour. Oats. Meats and other proteins Soybeans. Products made from soy protein. Black beans. Lentils. Mung beans. Split peas. Dairy Milk. Cream. Cheese. Yogurt. Cottage cheese. Beverages Coffee. Black tea. Red wine. Sweets and desserts Cocoa. Chocolate. Ice cream. Other foods Basil. Oregano. Large amounts of parsley. The items listed above may not be a complete list of foods and beverages to avoid. Contact a dietitian for more information. Summary  Iron is a mineral that helps your body to produce hemoglobin. Hemoglobin is a protein in red blood cells that carries oxygen to your body's tissues.  Iron is naturally found in many foods, and many foods have iron added to them (iron-fortified foods).  When you eat foods that contain iron, you should eat them with foods that are high in vitamin C. Vitamin C helps your body to absorb iron.  Certain foods and drinks prevent your body from absorbing iron properly, such as whole grains and dairy products. You  should avoid eating these foods in the same meal as iron-rich foods or with iron supplements. This information is not intended to replace advice given to you by your health care provider. Make sure you discuss any questions you have with your health care provider. Document Released: 08/06/2004 Document Revised: 11/18/2016 Document Reviewed: 11/18/2016 Elsevier Interactive Patient Education  2019 [employer].

## 2018-03-10 NOTE — Telephone Encounter (Signed)
Note sent to nurse. 

## 2018-03-15 ENCOUNTER — Other Ambulatory Visit: Payer: Self-pay

## 2018-03-15 ENCOUNTER — Encounter: Payer: Self-pay | Admitting: Advanced Practice Midwife

## 2018-03-15 ENCOUNTER — Ambulatory Visit (INDEPENDENT_AMBULATORY_CARE_PROVIDER_SITE_OTHER): Payer: BLUE CROSS/BLUE SHIELD | Admitting: Advanced Practice Midwife

## 2018-03-15 VITALS — BP 132/76 | HR 93 | Wt 166.0 lb

## 2018-03-15 DIAGNOSIS — Z1389 Encounter for screening for other disorder: Secondary | ICD-10-CM

## 2018-03-15 DIAGNOSIS — Z3A23 23 weeks gestation of pregnancy: Secondary | ICD-10-CM

## 2018-03-15 DIAGNOSIS — Z3482 Encounter for supervision of other normal pregnancy, second trimester: Secondary | ICD-10-CM

## 2018-03-15 DIAGNOSIS — Z331 Pregnant state, incidental: Secondary | ICD-10-CM

## 2018-03-15 LAB — POCT URINALYSIS DIPSTICK OB
Blood, UA: NEGATIVE
Glucose, UA: NEGATIVE
Ketones, UA: NEGATIVE
Leukocytes, UA: NEGATIVE
NITRITE UA: NEGATIVE
PROTEIN: NEGATIVE

## 2018-03-15 MED ORDER — ONDANSETRON 4 MG PO TBDP: 4.0000 mg | ORAL_TABLET | Freq: Three times a day (TID) | ORAL | 1 refills | Status: DC | PRN

## 2018-03-15 MED ORDER — BUTALBITAL-APAP-CAFFEINE 50-325-40 MG PO TABS: 1.0000 | ORAL_TABLET | Freq: Four times a day (QID) | ORAL | 0 refills | Status: DC | PRN

## 2018-03-15 NOTE — Patient Instructions (Signed)
.  thirdtimest

## 2018-03-15 NOTE — Progress Notes (Signed)
  [id number] [redacted]w[redacted]d Estimated Date of Delivery: 07/10/18  Blood pressure 132/76, pulse 93, weight 166 lb (75.3 kg), last menstrual period 10/03/2017.   BP weight and urine results all reviewed and noted.  Please refer to the obstetrical flow sheet for the fundal height and fetal heart rate documentation:  Patient reports good fetal movement, denies any bleeding and no rupture of membranes symptoms or regular contractions. Patient having some frontal HA and still gets nauseated.  All questions were answered.   Physical Assessment:   Vitals:   03/15/18 0957  BP: 132/76  Pulse: 93  Weight: 166 lb (75.3 kg)  Body mass index is 29.41 kg/m.        Physical Examination:   General appearance: Well appearing, and in no distress  Mental status: Alert, oriented to person, place, and time  Skin: Warm & dry  Cardiovascular: Normal heart rate noted  Respiratory: Normal respiratory effort, no distress  Abdomen: Soft, gravid, nontender  Pelvic: Cervical exam deferred         Extremities: Edema: Trace  Fetal Status: Fetal Heart Rate (bpm): 159 Fundal Height: 24 cm Movement: Present    Results for orders placed or performed in visit on 03/15/18 (from the past 24 hour(s))  POC Urinalysis Dipstick OB   Collection Time: 03/15/18  9:58 AM  Result Value Ref Range   Color, UA     Clarity, UA     Glucose, UA Negative Negative   Bilirubin, UA     Ketones, UA neg    Spec Grav, UA     Blood, UA neg    pH, UA     POC,PROTEIN,UA Negative Negative, Trace, Small (1+), Moderate (2+), Large (3+), 4+   Urobilinogen, UA     Nitrite, UA neg    Leukocytes, UA Negative Negative   Appearance     Odor       Orders Placed This Encounter  Procedures  . POC Urinalysis Dipstick OB    Plan:  Continued routine obstetrical care, rx zofran and fiorocet   Return in about 4 weeks (around 04/12/2018) for LROB (NO GTT< PN2 labs only).   Can't tolerate sugar, will rx meter/strips for 2 weeks of QID testing w/a  weekly fasting/pp.

## 2018-03-24 ENCOUNTER — Other Ambulatory Visit: Payer: Self-pay | Admitting: Advanced Practice Midwife

## 2018-03-24 DIAGNOSIS — R002 Palpitations: Secondary | ICD-10-CM

## 2018-03-24 NOTE — Progress Notes (Signed)
Cardiology consult for palpitations, dizziness, sweating, near syncope

## 2018-04-01 ENCOUNTER — Ambulatory Visit (INDEPENDENT_AMBULATORY_CARE_PROVIDER_SITE_OTHER): Payer: BC Managed Care – PPO | Admitting: Cardiovascular Disease

## 2018-04-01 ENCOUNTER — Encounter: Payer: Self-pay | Admitting: Cardiovascular Disease

## 2018-04-01 ENCOUNTER — Other Ambulatory Visit: Payer: Self-pay

## 2018-04-01 VITALS — BP 114/84 | HR 98

## 2018-04-01 DIAGNOSIS — R55 Syncope and collapse: Secondary | ICD-10-CM | POA: Diagnosis not present

## 2018-04-01 DIAGNOSIS — R002 Palpitations: Secondary | ICD-10-CM

## 2018-04-01 NOTE — Progress Notes (Signed)
Virtual Visit via Video Note     Evaluation Performed:  Follow-up visit  This visit type was conducted due to national recommendations for restrictions regarding the COVID-19 Pandemic (e.g. social distancing).  This format is felt to be most appropriate for this patient at this time.  All issues noted in this document were discussed and addressed.  No physical exam was performed (except for noted visual exam findings with Video Visits).  Please refer to the patient's chart (MyChart message for video visits and phone note for telephone visits) for the patient's consent to telehealth for [facility].  Date:  04/01/2018   ID:  Alexis Montgomery, DOB January 05, 1991, MRN [id number]  Patient Location:  [address] [place] [address]   Provider location:   [facility]      PCP:  [facility]  Cardiologist:  No primary care provider on file.  Electrophysiologist:  None   Chief Complaint: Palpitations, dizziness, near syncope  History of Present Illness:    Alexis Montgomery is a 28 y.o. female who presents via audio/video conferencing for a telehealth visit today.    The patient does not symptoms concerning for COVID-19 infection (fever, chills, cough, or new SHORTNESS OF BREATH).   She was recently evaluated by her certified nurse midwife, Ms. Jacklyn Shell.  She started experiencing palpitations in her 16th week of pregnancy.  She is presently [redacted] weeks pregnant.  They had been occurring sporadically but now are occurring on a daily basis and getting worse.  With tachycardia and palpitations, she experiences a sensation of feeling hot as well as feeling dizzy.  She has almost passed out but denies frank syncope.  The symptoms can occur when going from the sitting to standing position or to standing.  She checked her blood pressure today and it was 114/84 with a heart rate of 98 bpm.  She developed hypertension with her first pregnancy but then after undergoing weight  loss surgery over a year ago, she has not required antihypertensive therapy.  Her last ECG was over 1 year ago.  She drinks 3 bottles of water daily and avoids drinking caffeine in the form of coffee or sodas.  There is no family history of premature coronary artery disease.  She has been unable to check her heart rate thus far when it was fast.  Each episode lasted 15 to 20 minutes.  She now has an Scientist, physiological.  I reviewed labs dated 12/14/2017: Vitamin D was low at 24.4.  Renal function was normal.  Iron was normal at 51.  Hemoglobin had been 10.1 on 11/19/2017.    Prior CV studies:   The following studies were reviewed today:  No prior cardiac studies  Past Medical History:  Diagnosis Date  . Anemia   . Benign essential HTN 10/09/2014  . Hidradenitis suppurativa 10/09/2014  . Menorrhagia with irregular cycle 06/11/2015  . Morbid obesity (HCC) 10/09/2014  . MRSA infection 10/09/2014  . Pregnancy induced hypertension    Past Surgical History:  Procedure Laterality Date  . CESAREAN SECTION N/A 04/23/2012   Procedure: CESAREAN SECTION;  Surgeon: Tereso Newcomer, MD;  Location: [facility];  Service: Obstetrics;  Laterality: N/A;  . DILATION AND CURETTAGE OF UTERUS N/A 07/07/2016   Procedure: SUCTION DILATATION AND CURETTAGE;  Surgeon: Tilda Burrow, MD;  Location: [facility];  Service: Gynecology;  Laterality: N/A;  . 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 aspirin EC 81 MG tablet Take 81 mg by mouth daily.   . butalbital-acetaminophen-caffeine (FIORICET, ESGIC) 50-325-40 MG tablet Take 1 tablet by mouth every 6 (six) hours as needed for headache.  . Cyanocobalamin (VITAMIN B 12 PO) Take 5,000 Units by mouth daily.  . pantoprazole (PROTONIX) 40 MG tablet Take 1 tablet (40 mg total) by mouth daily.  . Prenatal Vit-Fe Fumarate-FA (PRENATAL VITAMIN PO) Take by mouth daily.  . promethazine  (PHENERGAN) 25 MG tablet TAKE ONE-HALF TO ONE TABLET (12.5 TO 25MG  TOTAL) BY MOUTH EVERY SIX HOURS AS NEEDED FOR NAUSEA OR VOMITING  . VITAMIN D, CHOLECALCIFEROL, PO Take by mouth 2 (two) times daily.     Allergies:   Patient has no known allergies.   Social History   Tobacco Use  . Smoking status: Never Smoker  . Smokeless tobacco: Never Used  Substance Use Topics  . Alcohol use: No    Alcohol/week: 0.0 standard drinks  . Drug use: No     Family Hx: The patient's family history includes Cancer in her mother; Diabetes in her paternal uncle; HIV in her mother; Heart Problems in her paternal grandfather.  ROS:   Please see the history of present illness.     All other systems reviewed and are negative.   Labs/Other Tests and Data Reviewed:    Recent Labs: 12/14/2017: ALT 13; BUN 12; Creatinine, Ser 0.42; Platelets 306; Potassium 4.2; Sodium 135 02/15/2018: Hemoglobin 10.9   Recent Lipid Panel No results found for: CHOL, TRIG, HDL, CHOLHDL, LDLCALC, LDLDIRECT  Wt Readings from Last 3 Encounters:  03/15/18 166 lb (75.3 kg)  02/15/18 160 lb (72.6 kg)  01/26/18 157 lb (71.2 kg)     Exam:    Vital Signs:  LMP 10/03/2017 (Exact Date)    Well nourished, well developed female in no acute distress. HEENT: Normocephalic, atraumatic, extraocular movements intact.  No conjunctival pallor.   ASSESSMENT & PLAN:    1.  Palpitations/near syncope: She may be experiencing symptomatic sinus tachycardia.  She currently drinks 3 bottles of water daily and I encouraged her to double fluid consumption and to also consume sugar-free electrolyte solutions such as G2 or similar products.  I will check a basic metabolic panel and magnesium level.  I will also check a CBC.  I will obtain a 48-hour Holter monitor.  She will also monitor her heart rates using her Apple watch.    COVID-19 Education: The signs and symptoms of COVID-19 were discussed with the patient and how to seek care for  testing (follow up with PCP or arrange E-visit).  The importance of social distancing was discussed today.  Patient Risk:   After full review of this patients clinical status, I feel that they are at least moderate risk at this time.  Time:   Today, I have spent 45 minutes with the patient with telehealth technology discussing the evaluation and management of palpitations.     Medication Adjustments/Labs and Tests Ordered: Current medicines are reviewed at length with the patient today.  Concerns regarding medicines are outlined above.  Tests Ordered: No orders of the defined types were placed in this encounter.  Medication Changes: No orders of the defined types were placed in this encounter.   Disposition:  FU 6 weeks  Signed, Prentice Docker, MD  04/01/2018 1:56 PM    [facility]

## 2018-04-01 NOTE — Progress Notes (Deleted)
Virtual Visit via Video Note   {Choose 1 Note Type (Telehealth Visit or Telephone Visit):[phone]}  Evaluation Performed:  Follow-up visit  This visit type was conducted due to national recommendations for restrictions regarding the COVID-19 Pandemic (e.g. social distancing).  This format is felt to be most appropriate for this patient at this time.  All issues noted in this document were discussed and addressed.  No physical exam was performed (except for noted visual exam findings with Video Visits).  Please refer to the patient's chart (MyChart message for video visits and phone note for telephone visits) for the patient's consent to telehealth for [facility].  Date:  04/01/2018   ID:  Alexis Montgomery, DOB 1990/04/17, MRN [id number]  Patient Location:  [address] [place] [address]   Provider location:   [place] office  PCP:  [facility]  Cardiologist:  No primary care provider on file.  Electrophysiologist:  None   Chief Complaint: Palpitations, dizziness, sweating, near syncope  History of Present Illness:    Alexis Montgomery is a 28 y.o. female who presents via audio/video conferencing for a telehealth visit today.    The patient does not symptoms concerning for COVID-19 infection (fever, chills, cough, or new SHORTNESS OF BREATH).   I reviewed notes in the EMR.  She saw her certified nurse midwife,   Prior CV studies:   The following studies were reviewed today:  No prior studies  Past Medical History:  Diagnosis Date  . Anemia   . Benign essential HTN 10/09/2014  . Hidradenitis suppurativa 10/09/2014  . Menorrhagia with irregular cycle 06/11/2015  . Morbid obesity (HCC) 10/09/2014  . MRSA infection 10/09/2014  . Pregnancy induced hypertension    Past Surgical History:  Procedure Laterality Date  . CESAREAN SECTION N/A 04/23/2012   Procedure: CESAREAN SECTION;  Surgeon: Tereso Newcomer, MD;  Location: [facility];  Service: Obstetrics;  Laterality:  N/A;  . DILATION AND CURETTAGE OF UTERUS N/A 07/07/2016   Procedure: SUCTION DILATATION AND CURETTAGE;  Surgeon: Tilda Burrow, MD;  Location: [facility];  Service: Gynecology;  Laterality: N/A;  . LAPAROSCOPIC ROUX-EN-Y GASTRIC BYPASS WITH UPPER ENDOSCOPY AND REMOVAL OF LAP BAND  06/08/2017     Current Meds  Medication Sig  . aspirin EC 81 MG tablet Take 81 mg by mouth daily.   . butalbital-acetaminophen-caffeine (FIORICET, ESGIC) 50-325-40 MG tablet Take 1 tablet by mouth every 6 (six) hours as needed for headache.  . Cyanocobalamin (VITAMIN B 12 PO) Take 5,000 Units by mouth daily.  . pantoprazole (PROTONIX) 40 MG tablet Take 1 tablet (40 mg total) by mouth daily.  . Prenatal Vit-Fe Fumarate-FA (PRENATAL VITAMIN PO) Take by mouth daily.  . promethazine (PHENERGAN) 25 MG tablet TAKE ONE-HALF TO ONE TABLET (12.5 TO 25MG  TOTAL) BY MOUTH EVERY SIX HOURS AS NEEDED FOR NAUSEA OR VOMITING  . VITAMIN D, CHOLECALCIFEROL, PO Take by mouth 2 (two) times daily.     Allergies:   Patient has no known allergies.   Social History   Tobacco Use  . Smoking status: Never Smoker  . Smokeless tobacco: Never Used  Substance Use Topics  . Alcohol use: No    Alcohol/week: 0.0 standard drinks  . Drug use: No     Family Hx: The patient's family history includes Cancer in her mother; Diabetes in her paternal uncle; HIV in her mother; Heart Problems in her paternal grandfather.  ROS:   Please see the history of present illness.    ***  All other systems reviewed and are negative.   Labs/Other Tests and Data Reviewed:    Recent Labs: 12/14/2017: ALT 13; BUN 12; Creatinine, Ser 0.42; Platelets 306; Potassium 4.2; Sodium 135 02/15/2018: Hemoglobin 10.9   Recent Lipid Panel No results found for: CHOL, TRIG, HDL, CHOLHDL, LDLCALC, LDLDIRECT  Wt Readings from Last 3 Encounters:  03/15/18 166 lb (75.3 kg)  02/15/18 160 lb (72.6 kg)  01/26/18 157 lb (71.2 kg)     Exam:    Vital Signs:  LMP  10/03/2017 (Exact Date)    Well nourished, well developed female in no*** acute distress. ***  ASSESSMENT & PLAN:    1.  ***  COVID-19 Education: The signs and symptoms of COVID-19 were discussed with the patient and how to seek care for testing (follow up with PCP or arrange E-visit).  ***The importance of social distancing was discussed today.  Patient Risk:   After full review of this patients clinical status, I feel that they are at least moderate risk at this time.  Time:   Today, I have spent *** minutes with the patient with telehealth technology discussing ***.     Medication Adjustments/Labs and Tests Ordered: Current medicines are reviewed at length with the patient today.  Concerns regarding medicines are outlined above.  Tests Ordered: No orders of the defined types were placed in this encounter.  Medication Changes: No orders of the defined types were placed in this encounter.   Disposition:  {follow up:15908}  Signed, Prentice Docker, MD  04/01/2018 1:17 PM    [facility]

## 2018-04-01 NOTE — Patient Instructions (Addendum)
Your physician recommends that you schedule a follow-up appointment in: 6 WEEKS WITH DR Purvis Sheffield  Your physician recommends that you continue on your current medications as directed. Please refer to the Current Medication list given to you today.  WE WILL MAIL YOU A MONITOR TO WEAR FOR 3 DAYS AND YOU MAY MAIL THIS MONITOR BACK   Your physician recommends that you return for lab work BMP/MG/CBC - WE HAVE MAILED YOU LAB ORDERS - YOU DO NOT NEED TO BE FASTING  PLEASE DRINK WATER AND ELECTROLYTE SOLUTIONS (LIKE G2)  Thank you for choosing [facility]!!

## 2018-04-01 NOTE — Addendum Note (Signed)
Addended by: Burman Nieves T on: 04/01/2018 04:34 PM   Modules accepted: Orders

## 2018-04-02 NOTE — Telephone Encounter (Signed)
The referral was sent.  The patient was scheduled for a virtual visit on 04/01/18/[facility]

## 2018-04-09 ENCOUNTER — Telehealth: Payer: Self-pay | Admitting: *Deleted

## 2018-04-09 NOTE — Telephone Encounter (Signed)

## 2018-04-12 ENCOUNTER — Other Ambulatory Visit: Payer: Medicaid Other

## 2018-04-12 ENCOUNTER — Ambulatory Visit (INDEPENDENT_AMBULATORY_CARE_PROVIDER_SITE_OTHER): Payer: BC Managed Care – PPO | Admitting: Obstetrics & Gynecology

## 2018-04-12 ENCOUNTER — Ambulatory Visit: Payer: Medicaid Other

## 2018-04-12 ENCOUNTER — Other Ambulatory Visit: Payer: Self-pay

## 2018-04-12 ENCOUNTER — Ambulatory Visit (INDEPENDENT_AMBULATORY_CARE_PROVIDER_SITE_OTHER): Payer: Medicaid Other

## 2018-04-12 ENCOUNTER — Encounter: Payer: Self-pay | Admitting: Obstetrics & Gynecology

## 2018-04-12 VITALS — BP 122/80 | HR 106 | Temp 98.9°F | Wt 164.5 lb

## 2018-04-12 DIAGNOSIS — Z3482 Encounter for supervision of other normal pregnancy, second trimester: Secondary | ICD-10-CM | POA: Diagnosis not present

## 2018-04-12 DIAGNOSIS — Z3A27 27 weeks gestation of pregnancy: Secondary | ICD-10-CM

## 2018-04-12 DIAGNOSIS — Z1389 Encounter for screening for other disorder: Secondary | ICD-10-CM

## 2018-04-12 DIAGNOSIS — O9989 Other specified diseases and conditions complicating pregnancy, childbirth and the puerperium: Secondary | ICD-10-CM

## 2018-04-12 DIAGNOSIS — R81 Glycosuria: Secondary | ICD-10-CM

## 2018-04-12 DIAGNOSIS — R002 Palpitations: Secondary | ICD-10-CM | POA: Diagnosis not present

## 2018-04-12 DIAGNOSIS — Z23 Encounter for immunization: Secondary | ICD-10-CM

## 2018-04-12 DIAGNOSIS — Z9884 Bariatric surgery status: Secondary | ICD-10-CM

## 2018-04-12 DIAGNOSIS — Z331 Pregnant state, incidental: Secondary | ICD-10-CM

## 2018-04-12 LAB — POCT URINALYSIS DIPSTICK OB
Ketones, UA: NEGATIVE
Leukocytes, UA: NEGATIVE
Nitrite, UA: NEGATIVE

## 2018-04-12 LAB — GLUCOSE, POCT (MANUAL RESULT ENTRY): POC Glucose: 123 mg/dl — AB (ref 70–99)

## 2018-04-12 NOTE — Progress Notes (Signed)
   LOW-RISK PREGNANCY VISIT Patient name: Alexis Montgomery MRN [id number]  Date of birth: February 08, 1990 Chief Complaint:   Routine Prenatal Visit  History of Present Illness:   Alexis Montgomery is a 28 y.o. [phone] female at [redacted]w[redacted]d with an Estimated Date of Delivery: 07/10/18 being seen today for ongoing management of a low-risk pregnancy.  Today she reports no complaints. Contractions: Not present. Vag. Bleeding: None.  Movement: Present. denies leaking of fluid. Review of Systems:   Pertinent items are noted in HPI Denies abnormal vaginal discharge w/ itching/odor/irritation, headaches, visual changes, shortness of breath, chest pain, abdominal pain, severe nausea/vomiting, or problems with urination or bowel movements unless otherwise stated above. Pertinent History Reviewed:  Reviewed past medical,surgical, social, obstetrical and family history.  Reviewed problem list, medications and allergies. Physical Assessment:   Vitals:   04/12/18 0919  BP: 122/80  Pulse: (!) 106  Temp: 98.9 F (37.2 C)  Weight: 164 lb 8 oz (74.6 kg)  Body mass index is 29.14 kg/m.        Physical Examination:   General appearance: Well appearing, and in no distress  Mental status: Alert, oriented to person, place, and time  Skin: Warm & dry  Cardiovascular: Normal heart rate noted  Respiratory: Normal respiratory effort, no distress  Abdomen: Soft, gravid, nontender  Pelvic: Cervical exam deferred         Extremities: Edema: None  Fetal Status:     Movement: Present    Results for orders placed or performed in visit on 04/12/18 (from the past 24 hour(s))  POC Urinalysis Dipstick OB   Collection Time: 04/12/18  9:20 AM  Result Value Ref Range   Color, UA     Clarity, UA     Glucose, UA 4+ (A) Negative   Bilirubin, UA     Ketones, UA neg    Spec Grav, UA     Blood, UA trace    pH, UA     POC,PROTEIN,UA Small (1+) Negative, Trace, Small (1+), Moderate (2+), Large (3+), 4+   Urobilinogen, UA     Nitrite, UA neg    Leukocytes, UA Negative Negative   Appearance     Odor    POCT glucose (manual entry)   Collection Time: 04/12/18  9:21 AM  Result Value Ref Range   POC Glucose 123 (A) 70 - 99 mg/dl    Assessment & Plan:  1) Low-risk pregnancy G5P0131 at [redacted]w[redacted]d with an Estimated Date of Delivery: 07/10/18   2) Roux-en-Y type bariatric surgery  3) Hx of pre eclampsia on baby ASA per day  As far as her GTT today, she has profound dumping syndrome, will check A1C and base decision on further eval on that   Meds: No orders of the defined types were placed in this encounter.  Labs/procedures today:   Plan:  Continue routine obstetrical care   Reviewed: Preterm labor symptoms and general obstetric precautions including but not limited to vaginal bleeding, contractions, leaking of fluid and fetal movement were reviewed in detail with the patient.  All questions were answered  Follow-up: Return for 3 weeks WEBEX visit, 5 weeks in office visit, LROB.  Orders Placed This Encounter  Procedures  . Tdap vaccine greater than or equal to 7yo IM  . POC Urinalysis Dipstick OB  . POCT glucose (manual entry)   Lazaro Arms  04/12/2018 9:59 AM

## 2018-04-13 LAB — HGB A1C W/O EAG: Hgb A1c MFr Bld: <4.2 % — ABNORMAL LOW (ref 4.8–5.6)

## 2018-04-13 LAB — CBC
Hematocrit: 30.8 % — ABNORMAL LOW (ref 34.0–46.6)
Hemoglobin: 9.2 g/dL — ABNORMAL LOW (ref 11.1–15.9)
MCH: 24.9 pg — ABNORMAL LOW (ref 26.6–33.0)
MCHC: 29.9 g/dL — ABNORMAL LOW (ref 31.5–35.7)
MCV: 84 fL (ref 79–97)
Platelets: 338 10*3/uL (ref 150–450)
RBC: 3.69 x10E6/uL — ABNORMAL LOW (ref 3.77–5.28)
RDW: 13.2 % (ref 11.7–15.4)
WBC: 10.7 10*3/uL (ref 3.4–10.8)

## 2018-04-13 LAB — HIV ANTIBODY (ROUTINE TESTING W REFLEX): HIV Screen 4th Generation wRfx: NONREACTIVE

## 2018-04-13 LAB — ANTIBODY SCREEN: Antibody Screen: NEGATIVE

## 2018-04-13 LAB — RPR: RPR Ser Ql: NONREACTIVE

## 2018-04-14 ENCOUNTER — Other Ambulatory Visit: Payer: Self-pay

## 2018-04-16 ENCOUNTER — Other Ambulatory Visit: Payer: Self-pay | Admitting: *Deleted

## 2018-04-21 ENCOUNTER — Inpatient Hospital Stay (HOSPITAL_COMMUNITY)
Admission: AD | Admit: 2018-04-21 | Discharge: 2018-04-21 | Disposition: A | Discharge status: Home / Self Care | Payer: BC Managed Care – PPO | Attending: Obstetrics and Gynecology | Admitting: Obstetrics and Gynecology

## 2018-04-21 ENCOUNTER — Other Ambulatory Visit: Payer: Self-pay

## 2018-04-21 ENCOUNTER — Other Ambulatory Visit: Payer: Self-pay | Admitting: *Deleted

## 2018-04-21 DIAGNOSIS — O1493 Unspecified pre-eclampsia, third trimester: Secondary | ICD-10-CM | POA: Insufficient documentation

## 2018-04-21 DIAGNOSIS — O368130 Decreased fetal movements, third trimester, not applicable or unspecified: Secondary | ICD-10-CM | POA: Diagnosis not present

## 2018-04-21 DIAGNOSIS — R002 Palpitations: Secondary | ICD-10-CM

## 2018-04-21 DIAGNOSIS — R42 Dizziness and giddiness: Secondary | ICD-10-CM | POA: Insufficient documentation

## 2018-04-21 DIAGNOSIS — Z7982 Long term (current) use of aspirin: Secondary | ICD-10-CM | POA: Diagnosis not present

## 2018-04-21 DIAGNOSIS — Z3A28 28 weeks gestation of pregnancy: Secondary | ICD-10-CM | POA: Diagnosis not present

## 2018-04-21 DIAGNOSIS — O26893 Other specified pregnancy related conditions, third trimester: Secondary | ICD-10-CM | POA: Diagnosis not present

## 2018-04-21 DIAGNOSIS — Z3689 Encounter for other specified antenatal screening: Secondary | ICD-10-CM | POA: Diagnosis not present

## 2018-04-21 DIAGNOSIS — O99213 Obesity complicating pregnancy, third trimester: Secondary | ICD-10-CM | POA: Diagnosis not present

## 2018-04-21 DIAGNOSIS — Z79899 Other long term (current) drug therapy: Secondary | ICD-10-CM | POA: Diagnosis not present

## 2018-04-21 DIAGNOSIS — O163 Unspecified maternal hypertension, third trimester: Secondary | ICD-10-CM | POA: Insufficient documentation

## 2018-04-21 NOTE — MAU Note (Signed)
Pt reports decreased fetal movement for 3 days. Pt bypassed triage to room. FHR 144 on doppler. Pt has felt some small movements today but not like she normally does. Pt denies pain, bleeding, or LOF.

## 2018-04-21 NOTE — MAU Provider Note (Signed)
History     CSN: [id number]  Arrival date and time: 04/21/18 7494   First Provider Initiated Contact with Patient 04/21/18 2015      Chief Complaint  Patient presents with  . Decreased Fetal Movement   Ms. Alexis Montgomery is a 28 y.o. [phone] at [redacted]w[redacted]d who presents to MAU for feeling decreased fetal movement beginning 3days ago.  Last food/drink: 1930 Smoker? no Current medications/supplements: PNVs, Fe, Zofran, Protonix, VitB, VitD, baby ASA Recent AFI: normal Anterior placenta? yes Doing FKCs? yes Problems this pregnancy include: hx of preeclampsia, palpitations w/dizziness Pt denies prior instances of DFM. Pt denies risk factors for stillbirth, including, but not limited to: IUGR, placental abruption, infection, genetic/congenital anomalies, fetomaternal hemorrhage, DM, HTN, smoking/drug use, umbilical cord/placental abnormalities, uterine abnormalities, fetal hydrops, platelet dysfunction, IHCP.  Pt denies VB, LOF, ctx.  Allergies? NKDA Prenatal care provider/next appt? [facility] 05/03/2018  OB History    Gravida  5   Para  1   Term  0   Preterm  1   AB  3   Living  1     SAB  2   TAB  0   Ectopic  1   Multiple  0   Live Births  1           Past Medical History:  Diagnosis Date  . Anemia   . Benign essential HTN 10/09/2014  . Hidradenitis suppurativa 10/09/2014  . Menorrhagia with irregular cycle 06/11/2015  . Morbid obesity (HCC) 10/09/2014  . MRSA infection 10/09/2014  . Pregnancy induced hypertension     Past Surgical History:  Procedure Laterality Date  . CESAREAN SECTION N/A 04/23/2012   Procedure: CESAREAN SECTION;  Surgeon: Tereso Newcomer, MD;  Location: [facility];  Service: Obstetrics;  Laterality: N/A;  . DILATION AND CURETTAGE OF UTERUS N/A 07/07/2016   Procedure: SUCTION DILATATION AND CURETTAGE;  Surgeon: Tilda Burrow, MD;  Location: [facility];  Service: Gynecology;  Laterality: N/A;  . LAPAROSCOPIC ROUX-EN-Y GASTRIC BYPASS WITH  UPPER ENDOSCOPY AND REMOVAL OF LAP BAND  06/08/2017    Family History  Problem Relation Age of Onset  . HIV Mother   . Cancer Mother   . Heart Problems Paternal Grandfather   . Diabetes Paternal Uncle     Social History   Tobacco Use  . Smoking status: Never Smoker  . Smokeless tobacco: Never Used  Substance Use Topics  . Alcohol use: No    Alcohol/week: 0.0 standard drinks  . Drug use: No    Allergies: No Known Allergies  Medications Prior to Admission  Medication Sig Dispense Refill Last Dose  . aspirin EC 81 MG tablet Take 81 mg by mouth daily.    Taking  . butalbital-acetaminophen-caffeine (FIORICET, ESGIC) 50-325-40 MG tablet Take 1 tablet by mouth every 6 (six) hours as needed for headache. 20 tablet 0 Taking  . Cyanocobalamin (VITAMIN B 12 PO) Take 5,000 Units by mouth daily.   Taking  . ondansetron (ZOFRAN-ODT) 4 MG disintegrating tablet    Taking  . pantoprazole (PROTONIX) 40 MG tablet Take 1 tablet (40 mg total) by mouth daily. 30 tablet 4 Taking  . Prenatal Vit-Fe Fumarate-FA (PRENATAL VITAMIN PO) Take by mouth daily.   Taking  . VITAMIN D, CHOLECALCIFEROL, PO Take by mouth 2 (two) times daily.   Taking    Review of Systems  Constitutional: Negative for chills and fever.  Respiratory: Negative for shortness of breath.   Cardiovascular: Negative for chest pain.  Gastrointestinal: Negative for abdominal pain.  Genitourinary: Negative for pelvic pain and vaginal bleeding.  Neurological: Negative for dizziness and headaches.   Physical Exam   Blood pressure 122/79, pulse 94, temperature 98.1 F (36.7 C), resp. rate 17, height 5\' 3"  (1.6 m), weight 76.5 kg, last menstrual period 10/03/2017, SpO2 98 %.  Patient Vitals for the past 24 hrs:  BP Temp Temp src Pulse Resp SpO2 Height Weight  04/21/18 2035 122/79 98.1 F (36.7 C) - 94 17 - - -  04/21/18 1950 119/77 98.2 F (36.8 C) Oral 85 16 98 % 5\' 3"  (1.6 m) 76.5 kg    Physical Exam  Constitutional: She is  oriented to person, place, and time. She appears well-developed and well-nourished. No distress.  HENT:  Head: Normocephalic and atraumatic.  Respiratory: Effort normal.  GI: Soft. She exhibits no distension and no mass. There is no abdominal tenderness. There is no rebound and no guarding.  Neurological: She is alert and oriented to person, place, and time.  Skin: Skin is warm and dry. She is not diaphoretic.  Psychiatric: She has a normal mood and affect. Her behavior is normal.   No results found for this or any previous visit (from the past 24 hour(s)).  No results found.  MAU Course  Procedures  MDM -pt reports normal fetal movement upon provider entering pt room -EFM: reactive       -baseline: 150       -variability: moderate       -accels: present, 15x15       -decels: few variable       -TOCO: no ctx -pt discharged to home in stable condition  Orders Placed This Encounter  Procedures  . Discharge patient    Order Specific Question:   Discharge disposition    Answer:   01-Home or Self Care [1]    Order Specific Question:   Discharge patient date    Answer:   04/21/2018   No orders of the defined types were placed in this encounter.  Assessment and Plan   1. Decreased fetal movements in third trimester, single or unspecified fetus   2. NST (non-stress test) reactive   3. [redacted] weeks gestation of pregnancy    Allergies as of 04/21/2018   No Known Allergies     Medication List    TAKE these medications   aspirin EC 81 MG tablet Take 81 mg by mouth daily.   butalbital-acetaminophen-caffeine 50-325-40 MG tablet Commonly known as:  FIORICET, ESGIC Take 1 tablet by mouth every 6 (six) hours as needed for headache.   ondansetron 4 MG disintegrating tablet Commonly known as:  ZOFRAN-ODT   pantoprazole 40 MG tablet Commonly known as:  PROTONIX Take 1 tablet (40 mg total) by mouth daily.   PRENATAL VITAMIN PO Take by mouth daily.   VITAMIN B 12 PO Take 5,000  Units by mouth daily.   VITAMIN D (CHOLECALCIFEROL) PO Take by mouth 2 (two) times daily.      -discussed normal expectations for fetal movement in pregnancy -discussed FKCs -pt to keep next appt -call OB's office if repeat of DFM -pt discharged to home in stable condition  Joni Reiningicole E Kandace Elrod 04/21/2018, 8:39 PM

## 2018-04-21 NOTE — Discharge Instructions (Signed)

## 2018-04-28 ENCOUNTER — Encounter: Payer: Self-pay | Admitting: *Deleted

## 2018-05-03 ENCOUNTER — Other Ambulatory Visit: Payer: Self-pay

## 2018-05-03 ENCOUNTER — Encounter: Payer: Self-pay | Admitting: Obstetrics and Gynecology

## 2018-05-03 ENCOUNTER — Ambulatory Visit (INDEPENDENT_AMBULATORY_CARE_PROVIDER_SITE_OTHER): Payer: BC Managed Care – PPO | Admitting: Obstetrics and Gynecology

## 2018-05-03 VITALS — BP 105/72 | HR 103 | Wt 166.2 lb

## 2018-05-03 DIAGNOSIS — O09213 Supervision of pregnancy with history of pre-term labor, third trimester: Secondary | ICD-10-CM

## 2018-05-03 DIAGNOSIS — O09293 Supervision of pregnancy with other poor reproductive or obstetric history, third trimester: Secondary | ICD-10-CM

## 2018-05-03 DIAGNOSIS — Z8751 Personal history of pre-term labor: Secondary | ICD-10-CM

## 2018-05-03 DIAGNOSIS — Z3A30 30 weeks gestation of pregnancy: Secondary | ICD-10-CM

## 2018-05-03 DIAGNOSIS — Z98891 History of uterine scar from previous surgery: Secondary | ICD-10-CM

## 2018-05-03 DIAGNOSIS — O09299 Supervision of pregnancy with other poor reproductive or obstetric history, unspecified trimester: Secondary | ICD-10-CM

## 2018-05-03 DIAGNOSIS — Z3482 Encounter for supervision of other normal pregnancy, second trimester: Secondary | ICD-10-CM

## 2018-05-03 NOTE — Progress Notes (Signed)
   TELEHEALTH VIRTUAL OBSTETRICS VISIT ENCOUNTER NOTE  I connected with Alexis Montgomery on 05/03/18 at  2:00 PM EDT by telephone at home and verified that I am speaking with the correct person using two identifiers.   I discussed the limitations, risks, security and privacy concerns of performing an evaluation and management service by telephone and the availability of in person appointments. I also discussed with the patient that there may be a patient responsible charge related to this service. The patient expressed understanding and agreed to proceed.  Subjective:  Alexis Montgomery is a 28 y.o. [phone] at [redacted]w[redacted]d being followed for ongoing prenatal care.  She is currently monitored for the following issues for this high-risk pregnancy and has Sepsis (HCC); Hidradenitis suppurativa; MRSA infection; History of chronic hypertension; H/O severe pre-e; Previous cesarean section; History of preterm delivery; Primary oligomenorrhea; History of Roux-en-Y gastric bypass; Chronic back pain; Iron deficiency; Kidney stone; Postoperative intestinal malabsorption; Supervision of normal pregnancy; and Low vitamin D level on their problem list.  Patient reports dizzy spells at times. Cardiology workup was negative.. Reports fetal movement. Denies any contractions, bleeding or leaking of fluid.   The following portions of the patient's history were reviewed and updated as appropriate: allergies, current medications, past family history, past medical history, past social history, past surgical history and problem list.   Objective:   General:  Alert, oriented and cooperative.   Mental Status: Normal mood and affect perceived. Normal judgment and thought content.  Rest of physical exam deferred due to type of encounter  Assessment and Plan:  Pregnancy: G5P0131 at [redacted]w[redacted]d 1. Encounter for supervision of other normal pregnancy in second trimester Stable  2. H/O severe pre-e BP 100's/70's at home  3. Previous  cesarean section Desires for repeat  4. History of preterm delivery   Preterm labor symptoms and general obstetric precautions including but not limited to vaginal bleeding, contractions, leaking of fluid and fetal movement were reviewed in detail with the patient.  I discussed the assessment and treatment plan with the patient. The patient was provided an opportunity to ask questions and all were answered. The patient agreed with the plan and demonstrated an understanding of the instructions. The patient was advised to call back or seek an in-person office evaluation/go to MAU at [facility] for any urgent or concerning symptoms. Please refer to After Visit Summary for other counseling recommendations.   I provided 11 minutes of non-face-to-face time during this encounter.  Return in about 4 weeks (around 05/31/2018) for OB visit, televisit.  Future Appointments  Date Time Provider Department Center  05/10/2018  1:40 PM Laqueta Linden, MD [facility]  05/17/2018 11:00 AM Eure, Amaryllis Dyke, MD [facility]    Hermina Staggers, MD Center for [facility], [facility]

## 2018-05-10 ENCOUNTER — Telehealth: Payer: Self-pay | Admitting: Cardiovascular Disease

## 2018-05-10 ENCOUNTER — Encounter: Payer: Self-pay | Admitting: Cardiovascular Disease

## 2018-05-10 ENCOUNTER — Telehealth (INDEPENDENT_AMBULATORY_CARE_PROVIDER_SITE_OTHER): Payer: BC Managed Care – PPO | Admitting: Cardiovascular Disease

## 2018-05-10 VITALS — BP 116/82 | HR 88 | Ht 63.0 in | Wt 169.8 lb

## 2018-05-10 DIAGNOSIS — R55 Syncope and collapse: Secondary | ICD-10-CM

## 2018-05-10 DIAGNOSIS — R002 Palpitations: Secondary | ICD-10-CM | POA: Diagnosis not present

## 2018-05-10 NOTE — Patient Instructions (Signed)

## 2018-05-10 NOTE — Telephone Encounter (Signed)
That would be fine. We discussed this during telehealth visit today.

## 2018-05-10 NOTE — Telephone Encounter (Signed)
Patient called stating that she would like to get work note to stay out of work until her baby is born. She is due July 4th.

## 2018-05-10 NOTE — Progress Notes (Signed)
Virtual Visit via Video Note   This visit type was conducted due to national recommendations for restrictions regarding the COVID-19 Pandemic (e.g. social distancing) in an effort to limit this patient's exposure and mitigate transmission in our community.  Due to her co-morbid illnesses, this patient is at least at moderate risk for complications without adequate follow up.  This format is felt to be most appropriate for this patient at this time.  All issues noted in this document were discussed and addressed.  A limited physical exam was performed with this format.  Please refer to the patient's chart for her consent to telehealth for [facility].   Date:  05/10/2018   ID:  Alexis Montgomery, DOB 1990/06/16, MRN [id number]  Patient Location: Home Provider Location: Home  PCP:  [facility]  Cardiologist:  No primary care provider on file.  Electrophysiologist:  None   Evaluation Performed:  Follow-Up Visit  Chief Complaint:  Palpitations  History of Present Illness:    Alexis Montgomery is a 28 y.o. female with palpitations and near syncope.  I initially evaluated her on 04/01/18 via telehealth.  She is pregnant and in her 31'st week. She is due July 4th but will be undergoing a C-section prior to that.  She continues to experience episodic palpitations with lightheadedness about twice a day. 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 Due to COVID, her husband has not been working. She is worried she may pass out at work.  Labs: Hgb 9.2 on 4/6.   Holter monitor reviewed below.  The patient does not have symptoms concerning for COVID-19 infection (fever, chills, cough, or new shortness of breath).    Past Medical History:  Diagnosis Date  . Anemia   . Benign essential HTN 10/09/2014  . Hidradenitis suppurativa 10/09/2014  . Menorrhagia with irregular cycle 06/11/2015  . Morbid obesity (HCC) 10/09/2014  . MRSA infection 10/09/2014  . Pregnancy induced  hypertension    Past Surgical History:  Procedure Laterality Date  . CESAREAN SECTION N/A 04/23/2012   Procedure: CESAREAN SECTION;  Surgeon: Tereso Newcomer, MD;  Location: [facility];  Service: Obstetrics;  Laterality: N/A;  . DILATION AND CURETTAGE OF UTERUS N/A 07/07/2016   Procedure: SUCTION DILATATION AND CURETTAGE;  Surgeon: Tilda Burrow, MD;  Location: [facility];  Service: Gynecology;  Laterality: N/A;  . LAPAROSCOPIC ROUX-EN-Y GASTRIC BYPASS WITH UPPER ENDOSCOPY AND REMOVAL OF LAP BAND  06/08/2017     Current Meds  Medication Sig  . aspirin EC 81 MG tablet Take 162 mg by mouth daily.   . butalbital-acetaminophen-caffeine (FIORICET, ESGIC) 50-325-40 MG tablet Take 1 tablet by mouth every 6 (six) hours as needed for headache.  . Cyanocobalamin (VITAMIN B 12 PO) Take 5,000 Units by mouth daily.  . Ferrous Sulfate (IRON PO) Take 325 mg by mouth 2 (two) times a day.  . ondansetron (ZOFRAN-ODT) 4 MG disintegrating tablet as needed.   . pantoprazole (PROTONIX) 40 MG tablet Take 1 tablet (40 mg total) by mouth daily.  . Prenatal Vit-Fe Fumarate-FA (PRENATAL VITAMIN PO) Take by mouth daily.  Marland Kitchen VITAMIN D, CHOLECALCIFEROL, PO Take by mouth 2 (two) times daily.     Allergies:   Patient has no known allergies.   Social History   Tobacco Use  . Smoking status: Never Smoker  . Smokeless tobacco: Never Used  Substance Use Topics  . Alcohol use: No    Alcohol/week: 0.0 standard drinks  . Drug use:  No     Family Hx: The patient's family history includes Cancer in her mother; Diabetes in her paternal uncle; HIV in her mother; Heart Problems in her paternal grandfather.  ROS:   Please see the history of present illness.     All other systems reviewed and are negative.   Prior CV studies:   The following studies were reviewed today:  Holter (04/21/18):   Predominantly sinus rhythm with rare PAC's and PVC's. Sinus tachycardia also seen. Isolated 4-beat run of non-sustained ventricular  tachycardia.  Symptoms correlated with sinus rhythm and sinus tachycardia.  Labs/Other Tests and Data Reviewed:    EKG:  No ECG reviewed.  Recent Labs: 12/14/2017: ALT 13; BUN 12; Creatinine, Ser 0.42; Potassium 4.2; Sodium 135 04/12/2018: Hemoglobin 9.2; Platelets 338   Recent Lipid Panel No results found for: CHOL, TRIG, HDL, CHOLHDL, LDLCALC, LDLDIRECT  Wt Readings from Last 3 Encounters:  05/10/18 169 lb 12.8 oz (77 kg)  05/03/18 166 lb 3.2 oz (75.4 kg)  04/21/18 168 lb 9.6 oz (76.5 kg)     Objective:    Vital Signs:  BP 116/82   Pulse 88   Ht 5\' 3"  (1.6 m)   Wt 169 lb 12.8 oz (77 kg)   LMP 10/03/2017 (Exact Date)   BMI 30.08 kg/m    VITAL SIGNS:  reviewed  Gen: NAD HEENT: e[place], [place]/at Neck: No JVD Neuro: Non focal. Alert and oriented x 3. Psych: Normal affect.  ASSESSMENT & PLAN:    1.  Palpitations/near syncope: Holter monitor reviewed above. Symptoms correlated with sinus rhythm and sinus tachycardia. I recommend adequate fluid consumption and caffeine avoidance. Will avoid medical therapy as she is pregnant and SBP's run low 100's at times. I have also recommended she consider staying out of work until after her pregnancy. She will discuss this with her husband. If so, I will write a letter for work supporting this decision.   COVID-19 Education: The signs and symptoms of COVID-19 were discussed with the patient and how to seek care for testing (follow up with PCP or arrange E-visit).  The importance of social distancing was discussed today.  Time:   Today, I have spent 15 minutes with the patient with telehealth technology discussing the above problems.     Medication Adjustments/Labs and Tests Ordered: Current medicines are reviewed at length with the patient today.  Concerns regarding medicines are outlined above.   Tests Ordered: No orders of the defined types were placed in this encounter.   Medication Changes: No orders of the defined types were  placed in this encounter.   Disposition:  Follow up in 6 month(s)  Signed, Prentice DockerSuresh Jay Haskew, MD  05/10/2018 1:23 PM    [facility]

## 2018-05-11 NOTE — Telephone Encounter (Signed)
[id number] 

## 2018-05-11 NOTE — Telephone Encounter (Signed)
Per Dr.Koneswaran, keep patient off work through 07/06/18     Letter emailed to patient

## 2018-05-13 ENCOUNTER — Encounter: Payer: Self-pay | Admitting: *Deleted

## 2018-05-17 ENCOUNTER — Other Ambulatory Visit: Payer: Self-pay

## 2018-05-17 ENCOUNTER — Ambulatory Visit (INDEPENDENT_AMBULATORY_CARE_PROVIDER_SITE_OTHER): Payer: Medicaid Other | Admitting: Obstetrics & Gynecology

## 2018-05-17 VITALS — BP 124/80 | HR 85 | Wt 170.0 lb

## 2018-05-17 DIAGNOSIS — Z1389 Encounter for screening for other disorder: Secondary | ICD-10-CM

## 2018-05-17 DIAGNOSIS — O09299 Supervision of pregnancy with other poor reproductive or obstetric history, unspecified trimester: Secondary | ICD-10-CM

## 2018-05-17 DIAGNOSIS — Z331 Pregnant state, incidental: Secondary | ICD-10-CM

## 2018-05-17 DIAGNOSIS — Z3482 Encounter for supervision of other normal pregnancy, second trimester: Secondary | ICD-10-CM

## 2018-05-17 DIAGNOSIS — Z98891 History of uterine scar from previous surgery: Secondary | ICD-10-CM

## 2018-05-17 DIAGNOSIS — Z3A32 32 weeks gestation of pregnancy: Secondary | ICD-10-CM

## 2018-05-17 LAB — POCT URINALYSIS DIPSTICK OB
Blood, UA: NEGATIVE
Glucose, UA: NEGATIVE
Ketones, UA: NEGATIVE
Leukocytes, UA: NEGATIVE
Nitrite, UA: NEGATIVE

## 2018-05-17 NOTE — Progress Notes (Signed)
Patient ID: Alexis Montgomery, female   DOB: [birth year], 28 y.o.   MRN: [id number]   LOW-RISK PREGNANCY VISIT Patient name: Alexis Montgomery MRN [id number]  Date of birth: 1990/10/12 Chief Complaint:   Routine Prenatal Visit  History of Present Illness:   Alexis Montgomery is a 28 y.o. [phone] female at [redacted]w[redacted]d with an Estimated Date of Delivery: 07/10/18 being seen today for ongoing management of a low-risk pregnancy.  Today she reports no complaints. Contractions: Not present. Vag. Bleeding: None.  Movement: Present. denies leaking of fluid. Review of Systems:   Pertinent items are noted in HPI Denies abnormal vaginal discharge w/ itching/odor/irritation, headaches, visual changes, shortness of breath, chest pain, abdominal pain, severe nausea/vomiting, or problems with urination or bowel movements unless otherwise stated above. Pertinent History Reviewed:  Reviewed past medical,surgical, social, obstetrical and family history.  Reviewed problem list, medications and allergies. Physical Assessment:   Vitals:   05/17/18 1055  BP: 124/80  Pulse: 85  Weight: 170 lb (77.1 kg)  Body mass index is 30.11 kg/m.        Physical Examination:   General appearance: Well appearing, and in no distress  Mental status: Alert, oriented to person, place, and time  Skin: Warm & dry  Cardiovascular: Normal heart rate noted  Respiratory: Normal respiratory effort, no distress  Abdomen: Soft, gravid, nontender  Pelvic: Cervical exam deferred         Extremities: Edema: None  Fetal Status: Fetal Heart Rate (bpm): 145 Fundal Height: 33 cm Movement: Present    Results for orders placed or performed in visit on 05/17/18 (from the past 24 hour(s))  POC Urinalysis Dipstick OB   Collection Time: 05/17/18 11:01 AM  Result Value Ref Range   Color, UA     Clarity, UA     Glucose, UA Negative Negative   Bilirubin, UA     Ketones, UA neg    Spec Grav, UA     Blood, UA neg    pH, UA     POC,PROTEIN,UA Trace Negative,  Trace, Small (1+), Moderate (2+), Large (3+), 4+   Urobilinogen, UA     Nitrite, UA neg    Leukocytes, UA Negative Negative   Appearance     Odor      Assessment & Plan:  1) Low-risk pregnancy [id number] at [redacted]w[redacted]d with an Estimated Date of Delivery: 07/10/18   2) Hx of severe pre eclampsia   Meds: No orders of the defined types were placed in this encounter.  Labs/procedures today:   Plan:  Continue routine obstetrical care   Reviewed: Preterm labor symptoms and general obstetric precautions including but not limited to vaginal bleeding, contractions, leaking of fluid and fetal movement were reviewed in detail with the patient.  All questions were answered  Follow-up: Return in about 3 weeks (around 06/07/2018) for LROB, with Dr Despina Hidden.  Orders Placed This Encounter  Procedures  . POC Urinalysis Dipstick OB   Lazaro Arms  05/17/2018 11:38 AM

## 2018-05-25 ENCOUNTER — Telehealth: Payer: Self-pay | Admitting: *Deleted

## 2018-05-25 NOTE — Telephone Encounter (Signed)
Pt called and stated that her heart rate was 104 and her bp was 98/60. Pt felt anxious at the time. I recommended that she rest for a little bit and drink some water. Patient stated that she was feeling better by the end of our phone call.

## 2018-06-04 ENCOUNTER — Encounter (HOSPITAL_COMMUNITY): Payer: Self-pay | Admitting: *Deleted

## 2018-06-04 ENCOUNTER — Encounter: Payer: Self-pay | Admitting: *Deleted

## 2018-06-04 ENCOUNTER — Other Ambulatory Visit: Payer: Self-pay

## 2018-06-04 ENCOUNTER — Inpatient Hospital Stay (HOSPITAL_COMMUNITY)
Admission: AD | Admit: 2018-06-04 | Discharge: 2018-06-04 | Disposition: A | Discharge status: Home / Self Care | Payer: BC Managed Care – PPO | Source: Ambulatory Visit | Attending: Obstetrics and Gynecology | Admitting: Obstetrics and Gynecology

## 2018-06-04 DIAGNOSIS — Z3A34 34 weeks gestation of pregnancy: Secondary | ICD-10-CM | POA: Diagnosis not present

## 2018-06-04 DIAGNOSIS — Z7982 Long term (current) use of aspirin: Secondary | ICD-10-CM | POA: Insufficient documentation

## 2018-06-04 DIAGNOSIS — R102 Pelvic and perineal pain: Secondary | ICD-10-CM | POA: Diagnosis not present

## 2018-06-04 DIAGNOSIS — O212 Late vomiting of pregnancy: Secondary | ICD-10-CM | POA: Insufficient documentation

## 2018-06-04 DIAGNOSIS — O99019 Anemia complicating pregnancy, unspecified trimester: Secondary | ICD-10-CM

## 2018-06-04 DIAGNOSIS — O163 Unspecified maternal hypertension, third trimester: Secondary | ICD-10-CM | POA: Insufficient documentation

## 2018-06-04 DIAGNOSIS — Z79899 Other long term (current) drug therapy: Secondary | ICD-10-CM | POA: Diagnosis not present

## 2018-06-04 DIAGNOSIS — M545 Low back pain: Secondary | ICD-10-CM | POA: Insufficient documentation

## 2018-06-04 DIAGNOSIS — D509 Iron deficiency anemia, unspecified: Secondary | ICD-10-CM | POA: Diagnosis not present

## 2018-06-04 DIAGNOSIS — O26893 Other specified pregnancy related conditions, third trimester: Secondary | ICD-10-CM | POA: Diagnosis not present

## 2018-06-04 DIAGNOSIS — O2343 Unspecified infection of urinary tract in pregnancy, third trimester: Secondary | ICD-10-CM

## 2018-06-04 HISTORY — DX: Tachycardia, unspecified: R00.0

## 2018-06-04 LAB — COMPREHENSIVE METABOLIC PANEL
ALT: 17 U/L (ref 0–44)
AST: 18 U/L (ref 15–41)
Albumin: 2.5 g/dL — ABNORMAL LOW (ref 3.5–5.0)
Alkaline Phosphatase: 81 U/L (ref 38–126)
Anion gap: 9 (ref 5–15)
BUN: 10 mg/dL (ref 6–20)
CO2: 21 mmol/L — ABNORMAL LOW (ref 22–32)
Calcium: 8.7 mg/dL — ABNORMAL LOW (ref 8.9–10.3)
Chloride: 107 mmol/L (ref 98–111)
Creatinine, Ser: 0.5 mg/dL (ref 0.44–1.00)
GFR calc Af Amer: >60 mL/min (ref >60)
GFR calc non Af Amer: >60 mL/min (ref >60)
Glucose, Bld: 77 mg/dL (ref 70–99)
Potassium: 3.8 mmol/L (ref 3.5–5.1)
Sodium: 137 mmol/L (ref 135–145)
Total Bilirubin: 0.3 mg/dL (ref 0.3–1.2)
Total Protein: 5.5 g/dL — ABNORMAL LOW (ref 6.5–8.1)

## 2018-06-04 LAB — CBC WITH DIFFERENTIAL/PLATELET
Abs Immature Granulocytes: 0.1 10*3/uL — ABNORMAL HIGH (ref 0.00–0.07)
Basophils Absolute: 0.1 10*3/uL (ref 0.0–0.1)
Basophils Relative: 1 %
Eosinophils Absolute: 0.1 10*3/uL (ref 0.0–0.5)
Eosinophils Relative: 1 %
HCT: 27.4 % — ABNORMAL LOW (ref 36.0–46.0)
Hemoglobin: 8 g/dL — ABNORMAL LOW (ref 12.0–15.0)
Immature Granulocytes: 1 %
Lymphocytes Relative: 23 %
Lymphs Abs: 2.4 10*3/uL (ref 0.7–4.0)
MCH: 21.7 pg — ABNORMAL LOW (ref 26.0–34.0)
MCHC: 29.2 g/dL — ABNORMAL LOW (ref 30.0–36.0)
MCV: 74.5 fL — ABNORMAL LOW (ref 80.0–100.0)
Monocytes Absolute: 0.6 10*3/uL (ref 0.1–1.0)
Monocytes Relative: 5 %
Neutro Abs: 7.2 10*3/uL (ref 1.7–7.7)
Neutrophils Relative %: 69 %
Platelets: 270 10*3/uL (ref 150–400)
RBC: 3.68 MIL/uL — ABNORMAL LOW (ref 3.87–5.11)
RDW: 14.7 % (ref 11.5–15.5)
WBC: 10.4 10*3/uL (ref 4.0–10.5)
nRBC: 0 % (ref 0.0–0.2)

## 2018-06-04 LAB — URINALYSIS, ROUTINE W REFLEX MICROSCOPIC
Bilirubin Urine: NEGATIVE
Glucose, UA: NEGATIVE mg/dL
Hgb urine dipstick: NEGATIVE
Ketones, ur: NEGATIVE mg/dL
Nitrite: NEGATIVE
Protein, ur: NEGATIVE mg/dL
Specific Gravity, Urine: 1.018 (ref 1.005–1.030)
WBC, UA: >50 WBC/hpf — ABNORMAL HIGH (ref 0–5)
pH: 6 (ref 5.0–8.0)

## 2018-06-04 MED ORDER — CEPHALEXIN 500 MG PO CAPS: 500.0000 mg | ORAL_CAPSULE | Freq: Four times a day (QID) | ORAL | 0 refills | Status: DC

## 2018-06-04 NOTE — MAU Provider Note (Addendum)
Chief Complaint:  Back Pain and Pelvic Pain   First Provider Initiated Contact with Patient 06/04/18 0710      HPI: Alexis Montgomery is a 28 y.o. [phone] at 2634w6dwho presents to maternity admissions reporting pain in lower back, lower abdomen and top of both thighs.  Also has some burning "at my pee-hole".   Developed yesterday.  Started having nausea and vomiting last night at 8pm.  . She reports good fetal movement, denies LOF, vaginal bleeding, vaginal itching/burning, urinary symptoms, h/a, dizziness, diarrhea, constipation or fever/chills.  She denies headache, visual changes or RUQ abdominal pain.  RN Note: For week have had a lot of lower back pain but now moves from lower back to vagina and lower abd. When I stand feels like baby is coming out. Baby has not moved as much as usual for last wk and half. Get 10 kicks in 2 hrs but not as much movement. Denies vag bleeding or LOF. Having a lot of d/c for wk and half when all the pelvic pain started. Milky d/c  Past Medical History: Past Medical History:  Diagnosis Date  . Anemia   . Benign essential HTN 10/09/2014  . Hidradenitis suppurativa 10/09/2014  . Menorrhagia with irregular cycle 06/11/2015  . Morbid obesity (HCC) 10/09/2014  . MRSA infection 10/09/2014  . Pregnancy induced hypertension   . Tachycardia     Past obstetric history: OB History  Gravida Para Term Preterm AB Living  5 1 0 1 3 1   SAB TAB Ectopic Multiple Live Births  2 0 1 0 1    # Outcome Date GA Lbr Len/2nd Weight Sex Delivery Anes PTL Lv  5 Current           4 Ectopic 06/2016          3 SAB 04/2016             Birth Comments: blighted ovum, GS 7654w1d, cytotec  2 Preterm 04/23/12 3185w4d  2995 g M CS-LTranv EPI  LIV     Complications: Failure to Progress in First Stage, Preeclampsia  1 SAB 2013            Past Surgical History: Past Surgical History:  Procedure Laterality Date  . CESAREAN SECTION N/A 04/23/2012   Procedure: CESAREAN SECTION;  Surgeon: Tereso NewcomerUgonna A  Anyanwu, MD;  Location: [facility];  Service: Obstetrics;  Laterality: N/A;  . DILATION AND CURETTAGE OF UTERUS N/A 07/07/2016   Procedure: SUCTION DILATATION AND CURETTAGE;  Surgeon: Tilda BurrowFerguson, John V, MD;  Location: [facility];  Service: Gynecology;  Laterality: N/A;  . LAPAROSCOPIC ROUX-EN-Y GASTRIC BYPASS WITH UPPER ENDOSCOPY AND REMOVAL OF LAP BAND  06/08/2017    Family History: Family History  Problem Relation Age of Onset  . HIV Mother   . Cancer Mother   . Heart Problems Paternal Grandfather   . Diabetes Paternal Uncle     Social History: Social History   Tobacco Use  . Smoking status: Never Smoker  . Smokeless tobacco: Never Used  Substance Use Topics  . Alcohol use: No    Alcohol/week: 0.0 standard drinks  . Drug use: No    Allergies: No Known Allergies  Meds:  Medications Prior to Admission  Medication Sig Dispense Refill Last Dose  . acetaminophen (TYLENOL) 500 MG tablet Take 1,000 mg by mouth every 6 (six) hours as needed.   06/04/2018 at 1205  . aspirin EC 81 MG tablet Take 162 mg by mouth daily.    06/03/2018 at  Unknown time  . butalbital-acetaminophen-caffeine (FIORICET, ESGIC) 50-325-40 MG tablet Take 1 tablet by mouth every 6 (six) hours as needed for headache. 20 tablet 0 Past Month at Unknown time  . Cyanocobalamin (VITAMIN B 12 PO) Take 5,000 Units by mouth daily.   06/03/2018 at Unknown time  . Ferrous Sulfate (IRON PO) Take 325 mg by mouth 2 (two) times a day.   06/03/2018 at Unknown time  . ondansetron (ZOFRAN-ODT) 4 MG disintegrating tablet as needed.    Past Week at Unknown time  . pantoprazole (PROTONIX) 40 MG tablet Take 1 tablet (40 mg total) by mouth daily. 30 tablet 4 06/03/2018 at Unknown time  . Prenatal Vit-Fe Fumarate-FA (PRENATAL VITAMIN PO) Take by mouth daily.   06/03/2018 at Unknown time  . VITAMIN D, CHOLECALCIFEROL, PO Take by mouth 2 (two) times daily.   06/03/2018 at Unknown time    I have reviewed patient's Past Medical Hx, Surgical Hx, Family Hx,  Social Hx, medications and allergies.   ROS:  Review of Systems  Constitutional: Negative for chills and fever.  Respiratory: Negative for shortness of breath.   Gastrointestinal: Positive for abdominal pain, nausea and vomiting. Negative for constipation and diarrhea.  Genitourinary: Positive for dysuria and vaginal discharge. Negative for vaginal bleeding.  Musculoskeletal: Positive for back pain.  Neurological: Negative for dizziness.   Other systems negative  Physical Exam   Patient Vitals for the past 24 hrs:  BP Temp Pulse Resp Height Weight  06/04/18 0639 123/75 98.3 F (36.8 C) 90 18  (1.6 m) 78 kg   Constitutional: Well-developed, well-nourished female in no acute distress.  Cardiovascular: normal rate and rhythm Respiratory: normal effort, clear to auscultation bilaterally GI: Abd soft, non-tender, gravid appropriate for gestational age.   No rebound or guarding. MS: Extremities nontender, no edema, normal ROM Neurologic: Alert and oriented x 4.  GU: Neg CVAT.  PELVIC EXAM:  Dilation: Closed Effacement (%): (long) Station: -3 Presentation: Vertex Exam by:: marie williams cnm   FHT:  Baseline 140 , moderate variability, accelerations present, no decelerations Contractions:  Rare   Labs: Ordered UA, CBC, CMET A/Positive/-- (12/09 1239)  Imaging:    MAU Course/MDM: I have ordered labs including UA, CBC, CMET. NST reviewed, reactive  Treatments in MAU included EFM.    Assessment: Single intrauterine pregnancy at [redacted]w[redacted]d Lower abdominal and Low back pain.  Plan:  Report given to oncoming provider  Wynelle Bourgeois CNM, MSN Certified Nurse-Midwife 06/04/2018   ASSESSMENT/PLAN Care taken over for Wynelle Bourgeois CNM @ 4782 Labs results pending when care taken over   Labs results reviewed:  Results for orders placed or performed during the hospital encounter of 06/04/18 (from the past 24 hour(s))  Urinalysis, Routine w reflex microscopic      Status: Abnormal   Collection Time: 06/04/18  7:05 AM  Result Value Ref Range   Color, Urine YELLOW YELLOW   APPearance HAZY (A) CLEAR   Specific Gravity, Urine 1.018 1.005 - 1.030   pH 6.0 5.0 - 8.0   Glucose, UA NEGATIVE NEGATIVE mg/dL   Hgb urine dipstick NEGATIVE NEGATIVE   Bilirubin Urine NEGATIVE NEGATIVE   Ketones, ur NEGATIVE NEGATIVE mg/dL   Protein, ur NEGATIVE NEGATIVE mg/dL   Nitrite NEGATIVE NEGATIVE   Leukocytes,Ua MODERATE (A) NEGATIVE   RBC / HPF 0-5 0 - 5 RBC/hpf   WBC, UA >50 (H) 0 - 5 WBC/hpf   Bacteria, UA RARE (A) NONE SEEN   Squamous Epithelial / LPF 11-20 0 -  5   Mucus PRESENT   CBC with Differential/Platelet     Status: Abnormal   Collection Time: 06/04/18  8:13 AM  Result Value Ref Range   WBC 10.4 4.0 - 10.5 K/uL   RBC 3.68 (L) 3.87 - 5.11 MIL/uL   Hemoglobin 8.0 (L) 12.0 - 15.0 g/dL   HCT 04.5 (L) 40.9 - 81.1 %   MCV 74.5 (L) 80.0 - 100.0 fL   MCH 21.7 (L) 26.0 - 34.0 pg   MCHC 29.2 (L) 30.0 - 36.0 g/dL   RDW 91.4 78.2 - 95.6 %   Platelets 270 150 - 400 K/uL   nRBC 0.0 0.0 - 0.2 %   Neutrophils Relative % 69 %   Neutro Abs 7.2 1.7 - 7.7 K/uL   Lymphocytes Relative 23 %   Lymphs Abs 2.4 0.7 - 4.0 K/uL   Monocytes Relative 5 %   Monocytes Absolute 0.6 0.1 - 1.0 K/uL   Eosinophils Relative 1 %   Eosinophils Absolute 0.1 0.0 - 0.5 K/uL   Basophils Relative 1 %   Basophils Absolute 0.1 0.0 - 0.1 K/uL   Immature Granulocytes 1 %   Abs Immature Granulocytes 0.10 (H) 0.00 - 0.07 K/uL  Comprehensive metabolic panel     Status: Abnormal   Collection Time: 06/04/18  8:13 AM  Result Value Ref Range   Sodium 137 135 - 145 mmol/L   Potassium 3.8 3.5 - 5.1 mmol/L   Chloride 107 98 - 111 mmol/L   CO2 21 (L) 22 - 32 mmol/L   Glucose, Bld 77 70 - 99 mg/dL   BUN 10 6 - 20 mg/dL   Creatinine, Ser 2.13 0.44 - 1.00 mg/dL   Calcium 8.7 (L) 8.9 - 10.3 mg/dL   Total Protein 5.5 (L) 6.5 - 8.1 g/dL   Albumin 2.5 (L) 3.5 - 5.0 g/dL   AST 18 15 - 41 U/L   ALT  17 0 - 44 U/L   Alkaline Phosphatase 81 38 - 126 U/L   Total Bilirubin 0.3 0.3 - 1.2 mg/dL   GFR calc non Af Amer >60 >60 mL/min   GFR calc Af Amer >60 >60 mL/min   Anion gap 9 5 - 15   Moderate leukocytes and rare bacteria noted on UA, will send urine culture.  CBC shows continued decrease of Hgb, no left shift or signs of pyelonephritis.  Educated and discussed feraheme infusion with patient. Message sent to office to have that scheduled.  Rx for Keflex sent to pharmacy of choice, pt stable at time of discharge.   Discharge home  NST reactive  Follow up in the office for prenatal care on Monday  Return to MAU as needed  PTL precautions discussed   Follow-up Information    [facility] Follow up.   Specialty:  Obstetrics and Gynecology Why:  Follow up as scheduled for prenatal appointments  Contact information: [address] [place] [address] [phone]         Allergies as of 06/04/2018   No Known Allergies     Medication List    TAKE these medications   acetaminophen 500 MG tablet Commonly known as:  TYLENOL Take 1,000 mg by mouth every 6 (six) hours as needed.   aspirin EC 81 MG tablet Take 162 mg by mouth daily.   butalbital-acetaminophen-caffeine 50-325-40 MG tablet Commonly known as:  FIORICET Take 1 tablet by mouth every 6 (six) hours as needed for headache.   cephALEXin 500  MG capsule Commonly known as:  KEFLEX Take 1 capsule (500 mg total) by mouth 4 (four) times daily.   IRON PO Take 325 mg by mouth 2 (two) times a day.   ondansetron 4 MG disintegrating tablet Commonly known as:  ZOFRAN-ODT as needed.   pantoprazole 40 MG tablet Commonly known as:  PROTONIX Take 1 tablet (40 mg total) by mouth daily.   PRENATAL VITAMIN PO Take by mouth daily.   VITAMIN B 12 PO Take 5,000 Units by mouth daily.   VITAMIN D (CHOLECALCIFEROL) PO Take by mouth 2 (two) times daily.      Sharyon Cable, CNM 06/04/18,  10:17 AM

## 2018-06-04 NOTE — MAU Note (Signed)
For week have had a lot of lower back pain but now moves from lower back to vagina and lower abd. When I stand feels like baby is coming out. Baby has not moved as much as usual for last wk and half. Get 10 kicks in 2 hrs but not as much movement. Denies vag bleeding or LOF. Having a lot of d/c for wk and half when all the pelvic pain started. Milky d/c

## 2018-06-04 NOTE — Discharge Instructions (Signed)
Pregnancy and Urinary Tract Infection What is a urinary tract infection?  A urinary tract infection (UTI) is an infection of any part of the urinary tract. This includes the kidneys, the tubes that connect your kidneys to your bladder (ureters), the bladder, and the tube that carries urine out of your body (urethra). These organs make, store, and get rid of urine in the body.  An upper UTI affects the ureters and kidneys (pyelonephritis), and a lower UTI affects the bladder (cystitis) and urethra (urethritis). Most urinary tract infections are caused by bacteria in your genital area, around the entrance to your urinary tract (urethra). These bacteria grow and cause irritation and inflammation of your urinary tract. Why am I more likely to get a UTI during pregnancy? You are more likely to develop a UTI during pregnancy because:  The physical and hormonal changes your body goes through can make it easier for bacteria to get into your urinary tract.  Your growing baby puts pressure on your uterus and can affect urine flow. Does a UTI place my baby at risk? An untreated UTI during pregnancy could lead to a kidney infection, which can cause health problems that could affect your baby. Possible complications of an untreated UTI include:  Having your baby before 37 weeks of pregnancy (premature).  Having a baby with a low birth weight.  Developing high blood pressure during pregnancy (preeclampsia).  Having a low hemoglobin level (anemia). What are the symptoms of a UTI? Symptoms of a UTI include:  Needing to urinate right away (urgently).  Frequent urination or passing small amounts of urine frequently.  Pain or burning with urination.  Blood in the urine.  Urine that smells bad or unusual.  Trouble urinating.  Cloudy urine.  Pain in the abdomen or lower back.  Vaginal discharge. You may also have:  Vomiting or a decreased appetite.  Confusion.  Irritability or  tiredness.  A fever.  Diarrhea. What are the treatment options for a UTI during pregnancy? Treatment for this condition may include:  Antibiotic medicines that are safe to take during pregnancy.  Other medicines to treat less common causes of UTI. How can I prevent a UTI? To prevent a UTI:  Go to the bathroom as soon as you feel the need. Do not hold urine for long periods of time.  Always wipe from front to back after a bowel movement. Use each tissue one time when you wipe.  Empty your bladder after sex.  Keep your genital area dry.  Drink 6-10 glasses of water each day.  Do not douche or use deodorant sprays. Contact a health care provider if:  Your symptoms do not improve or they get worse.  You have abnormal vaginal discharge. Get help right away if:  You have a fever.  You have nausea and vomiting.  You have back or side pain.  You feel contractions in your uterus.  You have lower belly pain.  You have a gush of fluid from your vagina.  You have blood in your urine. Summary  A urinary tract infection (UTI) is an infection of any part of the urinary tract, which includes the kidneys, ureters, bladder, and urethra.  Most urinary tract infections are caused by bacteria in your genital area, around the entrance to your urinary tract (urethra).  You are more likely to develop a UTI during pregnancy.  If you were prescribed an antibiotic, take it as told by your health care provider. Do not stop taking the  antibiotic even if you start to feel better. This information is not intended to replace advice given to you by your health care provider. Make sure you discuss any questions you have with your health care provider. Document Released: 04/19/2010 Document Revised: 02/17/2017 Document Reviewed: 11/13/2014 Elsevier Interactive Patient Education  2019 [employer]. Iron Deficiency Anemia, Adult Iron-deficiency anemia is when you have a low amount of red blood  cells or hemoglobin. This happens because you have too little iron in your body. Hemoglobin carries oxygen to parts of the body. Anemia can cause your body to not get enough oxygen. It may or may not cause symptoms. Follow these instructions at home: Medicines  Take over-the-counter and prescription medicines only as told by your doctor. This includes iron pills (supplements) and vitamins.  If you cannot handle taking iron pills by mouth, ask your doctor about getting iron through: ? A vein (intravenously). ? A shot (injection) into a muscle.  Take iron pills when your stomach is empty. If you cannot handle this, take them with food.  Do not drink milk or take antacids at the same time as your iron pills.  To prevent trouble pooping (constipation), eat fiber or take medicine (stool softener) as told by your doctor. Eating and drinking   Talk with your doctor before changing the foods you eat. He or she may tell you to eat foods that have a lot of iron, such as: ? Liver. ? Lowfat (lean) beef. ? Breads and cereals that have iron added to them (fortified breads and cereals). ? Eggs. ? Dried fruit. ? Dark green, leafy vegetables.  Drink enough fluid to keep your pee (urine) clear or pale yellow.  Eat fresh fruits and vegetables that are high in vitamin C. They help your body to use iron. Foods with a lot of vitamin C include: ? Oranges. ? Peppers. ? Tomatoes. ? Mangoes. General instructions  Return to your normal activities as told by your doctor. Ask your doctor what activities are safe for you.  Keep yourself clean, and keep things clean around you (your surroundings). Anemia can make you get sick more easily.  Keep all follow-up visits as told by your doctor. This is important. Contact a doctor if:  You feel sick to your stomach (nauseous).  You throw up (vomit).  You feel weak.  You are sweating for no clear reason.  You have trouble pooping, such as: ? Pooping  (having a bowel movement) less than 3 times a week. ? Straining to poop. ? Having poop that is hard, dry, or larger than normal. ? Feeling full or bloated. ? Pain in the lower belly. ? Not feeling better after pooping. Get help right away if:  You pass out (faint). If this happens, do not drive yourself to the hospital. Call your local emergency services (911 in the [place]  You have chest pain.  You have shortness of breath that: ? Is very bad. ? Gets worse with physical activity.  You have a fast heartbeat.  You get light-headed when getting up from sitting or lying down. This information is not intended to replace advice given to you by your health care provider. Make sure you discuss any questions you have with your health care provider. Document Released: 01/25/2010 Document Revised: 09/12/2015 Document Reviewed: 09/12/2015 Elsevier Interactive Patient Education  2019 [employer].

## 2018-06-06 LAB — CULTURE, OB URINE: Culture: >=100000 — AB

## 2018-06-07 ENCOUNTER — Telehealth: Payer: Self-pay | Admitting: *Deleted

## 2018-06-07 ENCOUNTER — Ambulatory Visit (INDEPENDENT_AMBULATORY_CARE_PROVIDER_SITE_OTHER): Payer: Medicaid Other | Admitting: Obstetrics and Gynecology

## 2018-06-07 ENCOUNTER — Encounter: Payer: Self-pay | Admitting: Obstetrics and Gynecology

## 2018-06-07 ENCOUNTER — Other Ambulatory Visit: Payer: Self-pay

## 2018-06-07 VITALS — BP 119/73 | HR 85 | Temp 98.4°F | Wt 172.2 lb

## 2018-06-07 DIAGNOSIS — Z331 Pregnant state, incidental: Secondary | ICD-10-CM

## 2018-06-07 DIAGNOSIS — Z98891 History of uterine scar from previous surgery: Secondary | ICD-10-CM

## 2018-06-07 DIAGNOSIS — Z8751 Personal history of pre-term labor: Secondary | ICD-10-CM

## 2018-06-07 DIAGNOSIS — O09213 Supervision of pregnancy with history of pre-term labor, third trimester: Secondary | ICD-10-CM

## 2018-06-07 DIAGNOSIS — Z3A35 35 weeks gestation of pregnancy: Secondary | ICD-10-CM

## 2018-06-07 DIAGNOSIS — O09299 Supervision of pregnancy with other poor reproductive or obstetric history, unspecified trimester: Secondary | ICD-10-CM

## 2018-06-07 DIAGNOSIS — Z3483 Encounter for supervision of other normal pregnancy, third trimester: Secondary | ICD-10-CM

## 2018-06-07 DIAGNOSIS — Z1389 Encounter for screening for other disorder: Secondary | ICD-10-CM

## 2018-06-07 DIAGNOSIS — O99019 Anemia complicating pregnancy, unspecified trimester: Secondary | ICD-10-CM | POA: Insufficient documentation

## 2018-06-07 DIAGNOSIS — O99013 Anemia complicating pregnancy, third trimester: Secondary | ICD-10-CM

## 2018-06-07 LAB — POCT URINALYSIS DIPSTICK OB
Blood, UA: NEGATIVE
Glucose, UA: NEGATIVE
Ketones, UA: NEGATIVE
Nitrite, UA: NEGATIVE

## 2018-06-07 MED ORDER — ONDANSETRON 4 MG PO TBDP: 4.0000 mg | ORAL_TABLET | Freq: Two times a day (BID) | ORAL | 1 refills | Status: DC

## 2018-06-07 NOTE — Telephone Encounter (Signed)
Spoke with Alexis Montgomery from [employer] [facility] @ [facility]. Pt is scheduled for feraheme infusion at [facility] @ 10:00. Pt aware. JSY

## 2018-06-07 NOTE — Patient Instructions (Signed)
Third Trimester of Pregnancy The third trimester is from week 28 through week 40 (months 7 through 9). The third trimester is a time when the unborn baby (fetus) is growing rapidly. At the end of the ninth month, the fetus is about 20 inches in length and weighs 6-10 pounds. Body changes during your third trimester Your body will continue to go through many changes during pregnancy. The changes vary from woman to woman. During the third trimester:  Your weight will continue to increase. You can expect to gain 25-35 pounds (11-16 kg) by the end of the pregnancy.  You may begin to get stretch marks on your hips, abdomen, and breasts.  You may urinate more often because the fetus is moving lower into your pelvis and pressing on your bladder.  You may develop or continue to have heartburn. This is caused by increased hormones that slow down muscles in the digestive tract.  You may develop or continue to have constipation because increased hormones slow digestion and cause the muscles that push waste through your intestines to relax.  You may develop hemorrhoids. These are swollen veins (varicose veins) in the rectum that can itch or be painful.  You may develop swollen, bulging veins (varicose veins) in your legs.  You may have increased body aches in the pelvis, back, or thighs. This is due to weight gain and increased hormones that are relaxing your joints.  You may have changes in your hair. These can include thickening of your hair, rapid growth, and changes in texture. Some women also have hair loss during or after pregnancy, or hair that feels dry or thin. Your hair will most likely return to normal after your baby is born.  Your breasts will continue to grow and they will continue to become tender. A yellow fluid (colostrum) may leak from your breasts. This is the first milk you are producing for your baby.  Your belly button may stick out.  You may notice more swelling in your hands,  face, or ankles.  You may have increased tingling or numbness in your hands, arms, and legs. The skin on your belly may also feel numb.  You may feel short of breath because of your expanding uterus.  You may have more problems sleeping. This can be caused by the size of your belly, increased need to urinate, and an increase in your body's metabolism.  You may notice the fetus "dropping," or moving lower in your abdomen (lightening).  You may have increased vaginal discharge.  You may notice your joints feel loose and you may have pain around your pelvic bone. What to expect at prenatal visits You will have prenatal exams every 2 weeks until week 36. Then you will have weekly prenatal exams. During a routine prenatal visit:  You will be weighed to make sure you and the baby are growing normally.  Your blood pressure will be taken.  Your abdomen will be measured to track your baby's growth.  The fetal heartbeat will be listened to.  Any test results from the previous visit will be discussed.  You may have a cervical check near your due date to see if your cervix has softened or thinned (effaced).  You will be tested for Group B streptococcus. This happens between 35 and 37 weeks. Your health care provider may ask you:  What your birth plan is.  How you are feeling.  If you are feeling the baby move.  If you have had any abnormal   symptoms, such as leaking fluid, bleeding, severe headaches, or abdominal cramping.  If you are using any tobacco products, including cigarettes, chewing tobacco, and electronic cigarettes.  If you have any questions. Other tests or screenings that may be performed during your third trimester include:  Blood tests that check for low iron levels (anemia).  Fetal testing to check the health, activity level, and growth of the fetus. Testing is done if you have certain medical conditions or if there are problems during the pregnancy.  Nonstress test  (NST). This test checks the health of your baby to make sure there are no signs of problems, such as the baby not getting enough oxygen. During this test, a belt is placed around your belly. The baby is made to move, and its heart rate is monitored during movement. What is false labor? False labor is a condition in which you feel small, irregular tightenings of the muscles in the womb (contractions) that usually go away with rest, changing position, or drinking water. These are called Braxton Hicks contractions. Contractions may last for hours, days, or even weeks before true labor sets in. If contractions come at regular intervals, become more frequent, increase in intensity, or become painful, you should see your health care provider. What are the signs of labor?  Abdominal cramps.  Regular contractions that start at 10 minutes apart and become stronger and more frequent with time.  Contractions that start on the top of the uterus and spread down to the lower abdomen and back.  Increased pelvic pressure and dull back pain.  A watery or bloody mucus discharge that comes from the vagina.  Leaking of amniotic fluid. This is also known as your "water breaking." It could be a slow trickle or a gush. Let your health care provider know if it has a color or strange odor. If you have any of these signs, call your health care provider right away, even if it is before your due date. Follow these instructions at home: Medicines  Follow your health care provider's instructions regarding medicine use. Specific medicines may be either safe or unsafe to take during pregnancy.  Take a prenatal vitamin that contains at least 600 micrograms (mcg) of folic acid.  If you develop constipation, try taking a stool softener if your health care provider approves. Eating and drinking   Eat a balanced diet that includes fresh fruits and vegetables, whole grains, good sources of protein such as meat, eggs, or tofu,  and low-fat dairy. Your health care provider will help you determine the amount of weight gain that is right for you.  Avoid raw meat and uncooked cheese. These carry germs that can cause birth defects in the baby.  If you have low calcium intake from food, talk to your health care provider about whether you should take a daily calcium supplement.  Eat four or five small meals rather than three large meals a day.  Limit foods that are high in fat and processed sugars, such as fried and sweet foods.  To prevent constipation: ? Drink enough fluid to keep your urine clear or pale yellow. ? Eat foods that are high in fiber, such as fresh fruits and vegetables, whole grains, and beans. Activity  Exercise only as directed by your health care provider. Most women can continue their usual exercise routine during pregnancy. Try to exercise for 30 minutes at least 5 days a week. Stop exercising if you experience uterine contractions.  Avoid heavy lifting.  Do   not exercise in extreme heat or humidity, or at high altitudes.  Wear low-heel, comfortable shoes.  Practice good posture.  You may continue to have sex unless your health care provider tells you otherwise. Relieving pain and discomfort  Take frequent breaks and rest with your legs elevated if you have leg cramps or low back pain.  Take warm sitz baths to soothe any pain or discomfort caused by hemorrhoids. Use hemorrhoid cream if your health care provider approves.  Wear a good support bra to prevent discomfort from breast tenderness.  If you develop varicose veins: ? Wear support pantyhose or compression stockings as told by your healthcare provider. ? Elevate your feet for 15 minutes, 3-4 times a day. Prenatal care  Write down your questions. Take them to your prenatal visits.  Keep all your prenatal visits as told by your health care provider. This is important. Safety  Wear your seat belt at all times when driving.  Make  a list of emergency phone numbers, including numbers for family, friends, the hospital, and police and fire departments. General instructions  Avoid cat litter boxes and soil used by cats. These carry germs that can cause birth defects in the baby. If you have a cat, ask someone to clean the litter box for you.  Do not travel far distances unless it is absolutely necessary and only with the approval of your health care provider.  Do not use hot tubs, steam rooms, or saunas.  Do not drink alcohol.  Do not use any products that contain nicotine or tobacco, such as cigarettes and e-cigarettes. If you need help quitting, ask your health care provider.  Do not use any medicinal herbs or unprescribed drugs. These chemicals affect the formation and growth of the baby.  Do not douche or use tampons or scented sanitary pads.  Do not cross your legs for long periods of time.  To prepare for the arrival of your baby: ? Take prenatal classes to understand, practice, and ask questions about labor and delivery. ? Make a trial run to the hospital. ? Visit the hospital and tour the maternity area. ? Arrange for maternity or paternity leave through employers. ? Arrange for family and friends to take care of pets while you are in the hospital. ? Purchase a rear-facing car seat and make sure you know how to install it in your car. ? Pack your hospital bag. ? Prepare the baby's nursery. Make sure to remove all pillows and stuffed animals from the baby's crib to prevent suffocation.  Visit your dentist if you have not gone during your pregnancy. Use a soft toothbrush to brush your teeth and be gentle when you floss. Contact a health care provider if:  You are unsure if you are in labor or if your water has broken.  You become dizzy.  You have mild pelvic cramps, pelvic pressure, or nagging pain in your abdominal area.  You have lower back pain.  You have persistent nausea, vomiting, or  diarrhea.  You have an unusual or bad smelling vaginal discharge.  You have pain when you urinate. Get help right away if:  Your water breaks before 37 weeks.  You have regular contractions less than 5 minutes apart before 37 weeks.  You have a fever.  You are leaking fluid from your vagina.  You have spotting or bleeding from your vagina.  You have severe abdominal pain or cramping.  You have rapid weight loss or weight gain.  You have   shortness of breath with chest pain.  You notice sudden or extreme swelling of your face, hands, ankles, feet, or legs.  Your baby makes fewer than 10 movements in 2 hours.  You have severe headaches that do not go away when you take medicine.  You have vision changes. Summary  The third trimester is from week 28 through week 40, months 7 through 9. The third trimester is a time when the unborn baby (fetus) is growing rapidly.  During the third trimester, your discomfort may increase as you and your baby continue to gain weight. You may have abdominal, leg, and back pain, sleeping problems, and an increased need to urinate.  During the third trimester your breasts will keep growing and they will continue to become tender. A yellow fluid (colostrum) may leak from your breasts. This is the first milk you are producing for your baby.  False labor is a condition in which you feel small, irregular tightenings of the muscles in the womb (contractions) that eventually go away. These are called Braxton Hicks contractions. Contractions may last for hours, days, or even weeks before true labor sets in.  Signs of labor can include: abdominal cramps; regular contractions that start at 10 minutes apart and become stronger and more frequent with time; watery or bloody mucus discharge that comes from the vagina; increased pelvic pressure and dull back pain; and leaking of amniotic fluid. This information is not intended to replace advice given to you by your  health care provider. Make sure you discuss any questions you have with your health care provider. Document Released: 12/17/2000 Document Revised: 01/29/2016 Document Reviewed: 01/29/2016 Elsevier Interactive Patient Education  2019 [employer] Inc[employer]  

## 2018-06-07 NOTE — Progress Notes (Signed)
Subjective:  Alexis Montgomery is a 28 y.o. [phone] at [redacted]w[redacted]d being seen today for ongoing prenatal care.  She is currently monitored for the following issues for this high-risk pregnancy and has Hidradenitis suppurativa; History of chronic hypertension; H/O severe pre-e; Previous cesarean section; History of preterm delivery; Primary oligomenorrhea; History of Roux-en-Y gastric bypass; Chronic back pain; Iron deficiency; Kidney stone; Postoperative intestinal malabsorption; Supervision of normal pregnancy; Low vitamin D level; and Anemia affecting pregnancy on their problem list.  Patient reports general discomforts of pregnancy. Seen in [facility] 06/04/18 for UTI. UC +, on Keflex. .  Contractions: Not present. Vag. Bleeding: None.  Movement: Present. Denies leaking of fluid.   The following portions of the patient's history were reviewed and updated as appropriate: allergies, current medications, past family history, past medical history, past social history, past surgical history and problem list. Problem list updated.  Objective:   Vitals:   06/07/18 1127  BP: 119/73  Pulse: 85  Temp: 98.4 F (36.9 C)  Weight: 172 lb 3.2 oz (78.1 kg)    Fetal Status:     Movement: Present     General:  Alert, oriented and cooperative. Patient is in no acute distress.  Skin: Skin is warm and dry. No rash noted.   Cardiovascular: Normal heart rate noted  Respiratory: Normal respiratory effort, no problems with respiration noted  Abdomen: Soft, gravid, appropriate for gestational age. Pain/Pressure: Present     Pelvic:  Cervical exam deferred        Extremities: Normal range of motion.  Edema: None  Mental Status: Normal mood and affect. Normal behavior. Normal judgment and thought content.   Urinalysis:      Assessment and Plan:  Pregnancy: [id number] at [redacted]w[redacted]d  1. Screening for genitourinary condition  - POC Urinalysis Dipstick OB  2. Pregnant state, incidental  - POC Urinalysis Dipstick OB  3. Encounter  for supervision of other normal pregnancy in third trimester Stable  4. H/O severe pre-e BP stable No S/Sx of PEC On BASA  5. Previous cesarean section Desire for repeat Scheduled at 39 weeks  6. History of preterm delivery D/T SPEC at 35 weeks  7. Anemia affecting pregnancy in third trimester Fereheme infusion ordered and scheduled  Preterm labor symptoms and general obstetric precautions including but not limited to vaginal bleeding, contractions, leaking of fluid and fetal movement were reviewed in detail with the patient. Please refer to After Visit Summary for other counseling recommendations.  Return in about 2 weeks (around 06/21/2018) for OB visit, face to face, GBS.   Hermina Staggers, MD

## 2018-06-08 ENCOUNTER — Encounter (HOSPITAL_COMMUNITY)
Admission: RE | Admit: 2018-06-08 | Discharge: 2018-06-08 | Disposition: A | Discharge status: Home / Self Care | Payer: BC Managed Care – PPO | Source: Ambulatory Visit | Attending: Obstetrics and Gynecology | Admitting: Obstetrics and Gynecology

## 2018-06-08 ENCOUNTER — Encounter (HOSPITAL_COMMUNITY): Payer: Self-pay

## 2018-06-08 DIAGNOSIS — Z3A00 Weeks of gestation of pregnancy not specified: Secondary | ICD-10-CM | POA: Diagnosis not present

## 2018-06-08 DIAGNOSIS — D649 Anemia, unspecified: Secondary | ICD-10-CM | POA: Insufficient documentation

## 2018-06-08 DIAGNOSIS — O99019 Anemia complicating pregnancy, unspecified trimester: Secondary | ICD-10-CM | POA: Diagnosis not present

## 2018-06-08 MED ORDER — SODIUM CHLORIDE 0.9 % IV SOLN
510.0000 mg | INTRAVENOUS | Status: DC
  Administered 2018-06-08: 510 mg via INTRAVENOUS
  Filled 2018-06-08: qty 17

## 2018-06-08 MED ORDER — SODIUM CHLORIDE 0.9 % IV SOLN
Freq: Once | INTRAVENOUS | Status: AC
  Administered 2018-06-08: 10:00:00 via INTRAVENOUS

## 2018-06-08 NOTE — Discharge Instructions (Signed)

## 2018-06-15 ENCOUNTER — Encounter (HOSPITAL_COMMUNITY)
Admission: RE | Admit: 2018-06-15 | Discharge: 2018-06-15 | Disposition: A | Discharge status: Home / Self Care | Payer: BC Managed Care – PPO | Source: Ambulatory Visit | Attending: Obstetrics and Gynecology | Admitting: Obstetrics and Gynecology

## 2018-06-15 ENCOUNTER — Other Ambulatory Visit: Payer: Self-pay

## 2018-06-15 ENCOUNTER — Encounter: Payer: Self-pay | Admitting: Advanced Practice Midwife

## 2018-06-15 ENCOUNTER — Encounter (HOSPITAL_COMMUNITY): Payer: Self-pay

## 2018-06-15 DIAGNOSIS — O99019 Anemia complicating pregnancy, unspecified trimester: Secondary | ICD-10-CM | POA: Diagnosis not present

## 2018-06-15 DIAGNOSIS — Z3A00 Weeks of gestation of pregnancy not specified: Secondary | ICD-10-CM | POA: Diagnosis not present

## 2018-06-15 DIAGNOSIS — D649 Anemia, unspecified: Secondary | ICD-10-CM | POA: Diagnosis not present

## 2018-06-15 MED ORDER — SODIUM CHLORIDE 0.9 % IV SOLN
510.0000 mg | Freq: Once | INTRAVENOUS | Status: AC
  Administered 2018-06-15: 510 mg via INTRAVENOUS
  Filled 2018-06-15: qty 17

## 2018-06-15 MED ORDER — SODIUM CHLORIDE 0.9 % IV SOLN
Freq: Once | INTRAVENOUS | Status: AC
  Administered 2018-06-15: 10:00:00 via INTRAVENOUS

## 2018-06-15 NOTE — Progress Notes (Signed)
Received second Feraheme.  States that the first dose elevated her symptoms she was having previously.  States that she did not get much rest last night.  Was up with vomiting and abdominal discomfort.  Stated that baby didn't feel as active as usual today.  FHT normal and strong.  No obvious distress noted.  Encouraged her to contact her physician with her concerns when she gets home.  Verbalizes understanding and states that she will do so.

## 2018-06-18 ENCOUNTER — Encounter: Payer: Self-pay | Admitting: *Deleted

## 2018-06-21 ENCOUNTER — Telehealth (HOSPITAL_COMMUNITY): Payer: Self-pay | Admitting: *Deleted

## 2018-06-21 ENCOUNTER — Ambulatory Visit (INDEPENDENT_AMBULATORY_CARE_PROVIDER_SITE_OTHER): Payer: BC Managed Care – PPO | Admitting: Advanced Practice Midwife

## 2018-06-21 ENCOUNTER — Other Ambulatory Visit: Payer: Self-pay

## 2018-06-21 VITALS — BP 120/82 | HR 94 | Wt 172.0 lb

## 2018-06-21 DIAGNOSIS — Z1389 Encounter for screening for other disorder: Secondary | ICD-10-CM

## 2018-06-21 DIAGNOSIS — Z331 Pregnant state, incidental: Secondary | ICD-10-CM

## 2018-06-21 DIAGNOSIS — Z3A37 37 weeks gestation of pregnancy: Secondary | ICD-10-CM

## 2018-06-21 DIAGNOSIS — Z3483 Encounter for supervision of other normal pregnancy, third trimester: Secondary | ICD-10-CM | POA: Diagnosis not present

## 2018-06-21 DIAGNOSIS — R002 Palpitations: Secondary | ICD-10-CM

## 2018-06-21 LAB — POCT URINALYSIS DIPSTICK OB
Blood, UA: NEGATIVE
Glucose, UA: NEGATIVE
Ketones, UA: NEGATIVE
Leukocytes, UA: NEGATIVE
Nitrite, UA: NEGATIVE
POC,PROTEIN,UA: NEGATIVE

## 2018-06-21 NOTE — Patient Instructions (Signed)

## 2018-06-21 NOTE — Telephone Encounter (Signed)
Preadmission screen  

## 2018-06-21 NOTE — Patient Instructions (Signed)
Alexis Montgomery  06/21/2018   Your procedure is scheduled on:  07/04/2018  Arrive at 0700 at Entrance C on [address] [facility]  and [address]. You are invited to use the FREE valet parking or use the Visitor's parking deck.  Pick up the phone at the desk and dial [phone].  Call this number if you have problems the morning of surgery: [phone]  Remember:   Do not eat food:(After Midnight) Desps de medianoche.  Do not drink clear liquids: (After Midnight) Desps de medianoche.  Take these medicines the morning of surgery with A SIP OF WATER:  none   Do not wear jewelry, make-up or nail polish.  Do not wear lotions, powders, or perfumes. Do not wear deodorant.  Do not shave 48 hours prior to surgery.  Do not bring valuables to the hospital.  [facility] is not   responsible for any belongings or valuables brought to the hospital.  Contacts, dentures or bridgework may not be worn into surgery.  Leave suitcase in the car. After surgery it may be brought to your room.  For patients admitted to the hospital, checkout time is 11:00 AM the day of              discharge.      Please read over the following fact sheets that you were given:     Preparing for Surgery

## 2018-06-21 NOTE — Progress Notes (Signed)
  [id number] [redacted]w[redacted]d Estimated Date of Delivery: 07/10/18  Blood pressure 120/82, pulse 94, weight 172 lb (78 kg), last menstrual period 10/03/2017.   BP weight and urine results all reviewed and noted.  Please refer to the obstetrical flow sheet for the fundal height and fetal heart rate documentation:  Patient reports good fetal movement, denies any bleeding and no rupture of membranes symptoms or regular contractions. Patient is without complaints. All questions were answered.   Physical Assessment:   Vitals:   06/21/18 1149  BP: 120/82  Pulse: 94  Weight: 172 lb (78 kg)  Body mass index is 30.47 kg/m.        Physical Examination:   General appearance: Well appearing, and in no distress  Mental status: Alert, oriented to person, place, and time  Skin: Warm & dry  Cardiovascular: Normal heart rate noted  Respiratory: Normal respiratory effort, no distress  Abdomen: Soft, gravid, nontender  Pelvic: Cervical exam performed  Dilation: Fingertip Effacement (%): Thick Station: -2  Extremities: Edema: Trace  Fetal Status: Fetal Heart Rate (bpm): 138 Fundal Height: 37 cm Movement: Present Presentation: Vertex  Results for orders placed or performed in visit on 06/21/18 (from the past 24 hour(s))  POC Urinalysis Dipstick OB   Collection Time: 06/21/18 11:53 AM  Result Value Ref Range   Color, UA     Clarity, UA     Glucose, UA Negative Negative   Bilirubin, UA     Ketones, UA neg    Spec Grav, UA     Blood, UA neg    pH, UA     POC,PROTEIN,UA Negative Negative, Trace, Small (1+), Moderate (2+), Large (3+), 4+   Urobilinogen, UA     Nitrite, UA neg    Leukocytes, UA Negative Negative   Appearance     Odor       Orders Placed This Encounter  Procedures  . Strep Gp B NAA  . GC/Chlamydia Probe Amp  . POC Urinalysis Dipstick OB    Plan:  Continued routine obstetrical care,   H/O severe pre-e BP stable No S/Sx of PEC On BASA-stop next week   Previous cesarean  section Desire for repeat Scheduled at 39 weeks   History of preterm delivery D/T SPEC at 35 weeks  Anemia affecting pregnancy in third trimester Fereheme infusion 06/15/18   Stuffy nose for several weeks; stop Afrin, go to saline.  Reassess next week.  Return in about 1 week (around 06/28/2018) for LROB/web ex.

## 2018-06-22 ENCOUNTER — Encounter (HOSPITAL_COMMUNITY): Payer: Self-pay

## 2018-06-23 LAB — STREP GP B NAA: Strep Gp B NAA: NEGATIVE

## 2018-06-25 LAB — GC/CHLAMYDIA PROBE AMP
Chlamydia trachomatis, NAA: NEGATIVE
Neisseria Gonorrhoeae by PCR: NEGATIVE

## 2018-06-28 ENCOUNTER — Ambulatory Visit (INDEPENDENT_AMBULATORY_CARE_PROVIDER_SITE_OTHER): Payer: Medicaid Other | Admitting: Women's Health

## 2018-06-28 ENCOUNTER — Encounter: Payer: Self-pay | Admitting: Women's Health

## 2018-06-28 ENCOUNTER — Other Ambulatory Visit: Payer: Self-pay

## 2018-06-28 VITALS — BP 117/76 | HR 81 | Wt 174.0 lb

## 2018-06-28 DIAGNOSIS — O09299 Supervision of pregnancy with other poor reproductive or obstetric history, unspecified trimester: Secondary | ICD-10-CM

## 2018-06-28 DIAGNOSIS — Z3483 Encounter for supervision of other normal pregnancy, third trimester: Secondary | ICD-10-CM

## 2018-06-28 DIAGNOSIS — Z3A38 38 weeks gestation of pregnancy: Secondary | ICD-10-CM

## 2018-06-28 DIAGNOSIS — O09293 Supervision of pregnancy with other poor reproductive or obstetric history, third trimester: Secondary | ICD-10-CM

## 2018-06-28 DIAGNOSIS — O99013 Anemia complicating pregnancy, third trimester: Secondary | ICD-10-CM

## 2018-06-28 NOTE — Patient Instructions (Signed)
Tips to Help You Sleep Better:   Get into a bedtime routine, try to do the same thing every night before going to bed to try to help your body wind down  Warm baths  Avoid caffeine for at least 3 hours before going to sleep   Keep your room at a slightly cooler temperature, can try running a fan  Turn off TV, lights, phone, electronics  Lots of pillows if needed to help you get comfortable  Lavender scented items can help you sleep. You can place lavender essential oil on a cotton ball and place under your pillowcase, or place in a diffuser. [employer] has a lavender scented sleep line (plug-ins, sprays, etc). Look in the pillow aisle for lavender scented pillows.   If none of the above things help, you can try 1/2 to 1 tablet of benadryl, unisom, or tylenol pm. Do not take this every night, only when you really need it.    Shanon Rosser, I greatly value your feedback.  If you receive a survey following your visit with [place] today, we appreciate you taking the time to fill it out.  Thanks, Knute Neu, CNM, [facility]  [place]!!! It is now [place] [facility] ([place], [place] [address]) Entrance located off of [address]    Call the office [phone]) or go to [facility] if:  You begin to have strong, frequent contractions  Your water breaks.  Sometimes it is a big gush of fluid, sometimes it is just a trickle that keeps getting your panties wet or running down your legs  You have vaginal bleeding.  It is normal to have a small amount of spotting if your cervix was checked.   You don't feel your baby moving like normal.  If you don't, get you something to eat and drink and lay down and focus on feeling your baby move.  You should feel at least 10 movements in 2 hours.  If you don't, you should call the office or go to [facility].    Call the office [phone]) or go to [facility] hospital for these  signs of pre-eclampsia:  Severe headache that does not go away with Tylenol  Visual changes- seeing spots, double, blurred vision  Pain under your right breast or upper abdomen that does not go away with Tums or heartburn medicine  Nausea and/or vomiting  Severe swelling in your hands, feet, and face

## 2018-06-28 NOTE — Progress Notes (Signed)
   TELEHEALTH VIRTUAL OBSTETRICS VISIT ENCOUNTER NOTE Patient name: Alexis Montgomery MRN [id number]  Date of birth: [birth year]  I connected with patient on 06/28/18 at  1:45 PM EDT by [facility] and verified that I am speaking with the correct person using two identifiers. Due to COVID-19 recommendations, pt is not currently in our office.    I discussed the limitations, risks, security and privacy concerns of performing an evaluation and management service by telephone and the availability of in person appointments. I also discussed with the patient that there may be a patient responsible charge related to this service. The patient expressed understanding and agreed to proceed.  Chief Complaint:   Routine Prenatal Visit (feeling fetal movement, but less)  History of Present Illness:   Alexis Montgomery is a 28 y.o. [phone] female at [redacted]w[redacted]d with an Estimated Date of Delivery: 07/10/18 being evaluated today for ongoing management of a low-risk pregnancy.  Today she reports not sleeping well. Baby's movements have slowed, still getting 10/2hr, just not as forceful. Contractions: Irregular.  .  Movement: Present. denies leaking of fluid. Review of Systems:   Pertinent items are noted in HPI Denies abnormal vaginal discharge w/ itching/odor/irritation, headaches, visual changes, shortness of breath, chest pain, abdominal pain, severe nausea/vomiting, or problems with urination or bowel movements unless otherwise stated above. Pertinent History Reviewed:  Reviewed past medical,surgical, social, obstetrical and family history.  Reviewed problem list, medications and allergies. Physical Assessment:   Vitals:   06/28/18 1406  BP: 117/76  Pulse: 81  Weight: 174 lb (78.9 kg)  Body mass index is 30.82 kg/m.        Physical Examination:   General:  Alert, oriented and cooperative.   Mental Status: Normal mood and affect perceived. Normal judgment and thought content.  Rest of physical exam deferred due to type  of encounter  No results found for this or any previous visit (from the past 24 hour(s)).  Assessment & Plan:  1) Pregnancy [id number] at [redacted]w[redacted]d with an Estimated Date of Delivery: 07/10/18   2) Prev c/s, for RCS on 6/28 as scheduled  3) H/O severe pre-e> bp's normal, reviewed pre-e s/s, reasons to seek care  4) Trouble sleeping> gave printed prevention/relief measures   5) FM not as forceful> discussed normal for this point in pregnancy, if ever <10/2hr get checked out  6) Anemia> hgb was 8.0 5/29, s/p IV Feraheme 6/9, continue po fe   Meds: No orders of the defined types were placed in this encounter.   Labs/procedures today: none  Plan:  Continue routine obstetrical care. Has BP cuff. Check bp daily d/t h/o pre-e, let us know if >140/90.   Reviewed: Term labor symptoms and general obstetric precautions including but not limited to vaginal bleeding, contractions, leaking of fluid and fetal movement were reviewed in detail with the patient. The patient was advised to call back or seek an in-person office evaluation/go to [facility] at [facility] for any urgent or concerning symptoms. All questions were answered. Please refer to After Visit Summary for other counseling recommendations.   I provided 15 minutes of non-face-to-face time during this encounter.  Follow-up: Return in about 2 weeks (around 07/12/2018) for for incision check.  No orders of the defined types were placed in this encounter.  [place], [facility] 06/28/2018 2:33 PM

## 2018-06-30 ENCOUNTER — Other Ambulatory Visit: Payer: Self-pay

## 2018-06-30 ENCOUNTER — Inpatient Hospital Stay (HOSPITAL_COMMUNITY)
Admission: AD | Admit: 2018-06-30 | Discharge: 2018-06-30 | Disposition: A | Discharge status: Home / Self Care | Payer: BC Managed Care – PPO | Attending: Obstetrics & Gynecology | Admitting: Obstetrics & Gynecology

## 2018-06-30 ENCOUNTER — Encounter (HOSPITAL_COMMUNITY): Payer: Self-pay

## 2018-06-30 DIAGNOSIS — O219 Vomiting of pregnancy, unspecified: Secondary | ICD-10-CM

## 2018-06-30 DIAGNOSIS — O99213 Obesity complicating pregnancy, third trimester: Secondary | ICD-10-CM | POA: Insufficient documentation

## 2018-06-30 DIAGNOSIS — Z3A38 38 weeks gestation of pregnancy: Secondary | ICD-10-CM

## 2018-06-30 DIAGNOSIS — O34219 Maternal care for unspecified type scar from previous cesarean delivery: Secondary | ICD-10-CM | POA: Insufficient documentation

## 2018-06-30 DIAGNOSIS — Z8249 Family history of ischemic heart disease and other diseases of the circulatory system: Secondary | ICD-10-CM | POA: Insufficient documentation

## 2018-06-30 DIAGNOSIS — Z1159 Encounter for screening for other viral diseases: Secondary | ICD-10-CM | POA: Diagnosis not present

## 2018-06-30 DIAGNOSIS — O133 Gestational [pregnancy-induced] hypertension without significant proteinuria, third trimester: Secondary | ICD-10-CM | POA: Insufficient documentation

## 2018-06-30 DIAGNOSIS — Z833 Family history of diabetes mellitus: Secondary | ICD-10-CM | POA: Diagnosis not present

## 2018-06-30 DIAGNOSIS — Z809 Family history of malignant neoplasm, unspecified: Secondary | ICD-10-CM | POA: Diagnosis not present

## 2018-06-30 DIAGNOSIS — Z7982 Long term (current) use of aspirin: Secondary | ICD-10-CM | POA: Diagnosis not present

## 2018-06-30 DIAGNOSIS — O99613 Diseases of the digestive system complicating pregnancy, third trimester: Secondary | ICD-10-CM | POA: Diagnosis not present

## 2018-06-30 DIAGNOSIS — K529 Noninfective gastroenteritis and colitis, unspecified: Secondary | ICD-10-CM | POA: Diagnosis not present

## 2018-06-30 LAB — URINALYSIS, ROUTINE W REFLEX MICROSCOPIC
Bilirubin Urine: NEGATIVE
Glucose, UA: NEGATIVE mg/dL
Ketones, ur: NEGATIVE mg/dL
Leukocytes,Ua: NEGATIVE
Nitrite: NEGATIVE
Protein, ur: NEGATIVE mg/dL
Specific Gravity, Urine: 1.014 (ref 1.005–1.030)
pH: 6 (ref 5.0–8.0)

## 2018-06-30 MED ORDER — ONDANSETRON 4 MG PO TBDP
8.0000 mg | ORAL_TABLET | Freq: Once | ORAL | Status: AC
  Administered 2018-06-30: 22:00:00 8 mg via ORAL
  Filled 2018-06-30: qty 2

## 2018-06-30 NOTE — Discharge Instructions (Signed)
Food Choices to Help Relieve Diarrhea, Adult °When you have diarrhea, the foods you eat and your eating habits are very important. Choosing the right foods and drinks can help: °· Relieve diarrhea. °· Replace lost fluids and nutrients. °· Prevent dehydration. °What general guidelines should I follow? ° °Relieving diarrhea °· Choose foods with less than 2 g or .07 oz. of fiber per serving. °· Limit fats to less than 8 tsp (38 g or 1.34 oz.) a day. °· Avoid the following: °? Foods and beverages sweetened with high-fructose corn syrup, honey, or sugar alcohols such as xylitol, sorbitol, and mannitol. °? Foods that contain a lot of fat or sugar. °? Fried, greasy, or spicy foods. °? High-fiber grains, breads, and cereals. °? Raw fruits and vegetables. °· Eat foods that are rich in probiotics. These foods include dairy products such as yogurt and fermented milk products. They help increase healthy bacteria in the stomach and intestines (gastrointestinal tract, or GI tract). °· If you have lactose intolerance, avoid dairy products. These may make your diarrhea worse. °· Take medicine to help stop diarrhea (antidiarrheal medicine) only as told by your health care provider. °Replacing nutrients °· Eat small meals or snacks every 3-4 hours. °· Eat bland foods, such as white rice, toast, or baked potato, until your diarrhea starts to get better. Gradually reintroduce nutrient-rich foods as tolerated or as told by your health care provider. This includes: °? Well-cooked protein foods. °? Peeled, seeded, and soft-cooked fruits and vegetables. °? Low-fat dairy products. °· Take vitamin and mineral supplements as told by your health care provider. °Preventing dehydration °· Start by sipping water or a special solution to prevent dehydration (oral rehydration solution, ORS). Urine that is clear or pale yellow means that you are getting enough fluid. °· Try to drink at least 8-10 cups of fluid each day to help replace lost  fluids. °· You may add other liquids in addition to water, such as clear juice or decaffeinated sports drinks, as tolerated or as told by your health care provider. °· Avoid drinks with caffeine, such as coffee, tea, or soft drinks. °· Avoid alcohol. °What foods are recommended? ° °  ° °The items listed may not be a complete list. Talk with your health care provider about what dietary choices are best for you. °Grains °White rice. White, French, or pita breads (fresh or toasted), including plain rolls, buns, or bagels. White pasta. Saltine, soda, or graham crackers. Pretzels. Low-fiber cereal. Cooked cereals made with water (such as cornmeal, farina, or cream cereals). Plain muffins. Matzo. Melba toast. Zwieback. °Vegetables °Potatoes (without the skin). Most well-cooked and canned vegetables without skins or seeds. Tender lettuce. °Fruits °Apple sauce. Fruits canned in juice. Cooked apricots, cherries, grapefruit, peaches, pears, or plums. Fresh bananas and cantaloupe. °Meats and other protein foods °Baked or boiled chicken. Eggs. Tofu. Fish. Seafood. Smooth nut butters. Ground or well-cooked tender beef, ham, veal, lamb, pork, or poultry. °Dairy °Plain yogurt, kefir, and unsweetened liquid yogurt. Lactose-free milk, buttermilk, skim milk, or soy milk. Low-fat or nonfat hard cheese. °Beverages °Water. Low-calorie sports drinks. Fruit juices without pulp. Strained tomato and vegetable juices. Decaffeinated teas. Sugar-free beverages not sweetened with sugar alcohols. Oral rehydration solutions, if approved by your health care provider. °Seasoning and other foods °Bouillon, broth, or soups made from recommended foods. °What foods are not recommended? °The items listed may not be a complete list. Talk with your health care provider about what dietary choices are best for you. °Grains °Whole   grain, whole wheat, bran, or rye breads, rolls, pastas, and crackers. Wild or brown rice. Whole grain or bran cereals. Barley.  Oats and oatmeal. Corn tortillas or taco shells. Granola. Popcorn. °Vegetables °Raw vegetables. Fried vegetables. Cabbage, broccoli, Brussels sprouts, artichokes, baked beans, beet greens, corn, kale, legumes, peas, sweet potatoes, and yams. Potato skins. Cooked spinach and cabbage. °Fruits °Dried fruit, including raisins and dates. Raw fruits. Stewed or dried prunes. Canned fruits with syrup. °Meat and other protein foods °Fried or fatty meats. Deli meats. Chunky nut butters. Nuts and seeds. Beans and lentils. Bacon. Hot dogs. Sausage. °Dairy °High-fat cheeses. Whole milk, chocolate milk, and beverages made with milk, such as milk shakes. Half-and-half. Cream. sour cream. Ice cream. °Beverages °Caffeinated beverages (such as coffee, tea, soda, or energy drinks). Alcoholic beverages. Fruit juices with pulp. Prune juice. Soft drinks sweetened with high-fructose corn syrup or sugar alcohols. High-calorie sports drinks. °Fats and oils °Butter. Cream sauces. Margarine. Salad oils. Plain salad dressings. Olives. Avocados. Mayonnaise. °Sweets and desserts °Sweet rolls, doughnuts, and sweet breads. Sugar-free desserts sweetened with sugar alcohols such as xylitol and sorbitol. °Seasoning and other foods °Honey. Hot sauce. Chili powder. Gravy. Cream-based or milk-based soups. Pancakes and waffles. °Summary °· When you have diarrhea, the foods you eat and your eating habits are very important. °· Make sure you get at least 8-10 cups of fluid each day, or enough to keep your urine clear or pale yellow. °· Eat bland foods and gradually reintroduce healthy, nutrient-rich foods as tolerated, or as told by your health care provider. °· Avoid high-fiber, fried, greasy, or spicy foods. °This information is not intended to replace advice given to you by your health care provider. Make sure you discuss any questions you have with your health care provider. °Document Released: 03/15/2003 Document Revised: 12/21/2015 Document Reviewed:  12/21/2015 °Elsevier Interactive Patient Education © 2019 [employer] ° ° ° ° °Fetal Movement Counts °Patient Name: ________________________________________________ Patient Due Date: ____________________ °What is a fetal movement count? ° °A fetal movement count is the number of times that you feel your baby move during a certain amount of time. This may also be called a fetal kick count. A fetal movement count is recommended for every pregnant woman. You may be asked to start counting fetal movements as early as week 28 of your pregnancy. °Pay attention to when your baby is most active. You may notice your baby's sleep and wake cycles. You may also notice things that make your baby move more. You should do a fetal movement count: °· When your baby is normally most active. °· At the same time each day. °A good time to count movements is while you are resting, after having something to eat and drink. °How do I count fetal movements? °1. Find a quiet, comfortable area. Sit, or lie down on your side. °2. Write down the date, the start time and stop time, and the number of movements that you felt between those two times. Take this information with you to your health care visits. °3. For 2 hours, count kicks, flutters, swishes, rolls, and jabs. You should feel at least 10 movements during 2 hours. °4. You may stop counting after you have felt 10 movements. °5. If you do not feel 10 movements in 2 hours, have something to eat and drink. Then, keep resting and counting for 1 hour. If you feel at least 4 movements during that hour, you may stop counting. °Contact a health care provider if: °· You   feel fewer than 4 movements in 2 hours. °· Your baby is not moving like he or she usually does. °Date: ____________ Start time: ____________ Stop time: ____________ Movements: ____________ °Date: ____________ Start time: ____________ Stop time: ____________ Movements: ____________ °Date: ____________ Start time: ____________ Stop  time: ____________ Movements: ____________ °Date: ____________ Start time: ____________ Stop time: ____________ Movements: ____________ °Date: ____________ Start time: ____________ Stop time: ____________ Movements: ____________ °Date: ____________ Start time: ____________ Stop time: ____________ Movements: ____________ °Date: ____________ Start time: ____________ Stop time: ____________ Movements: ____________ °Date: ____________ Start time: ____________ Stop time: ____________ Movements: ____________ °Date: ____________ Start time: ____________ Stop time: ____________ Movements: ____________ °This information is not intended to replace advice given to you by your health care provider. Make sure you discuss any questions you have with your health care provider. °Document Released: 01/22/2006 Document Revised: 08/22/2015 Document Reviewed: 02/01/2015 °Elsevier Interactive Patient Education © 2019 [employer] ° °

## 2018-06-30 NOTE — MAU Provider Note (Signed)
Chief Complaint:  Emesis and Diarrhea   First Provider Initiated Contact with Patient 06/30/18 2050     HPI: Alexis Montgomery is a 28 y.o. [id number] at 6528w4d who presents to maternity admissions reporting n/v/d. Symptoms started around 2 am this morning. States she has vomited every time she eats today. Has had "countless" episodes of loose green stools. Doesn't remember what she ate last night. Denies fever/chills, abdominal pain, or hematochezia.  Denies contractions, leakage of fluid or vaginal bleeding. Good fetal movement.   Past Medical History:  Diagnosis Date  . Anemia   . Benign essential HTN 10/09/2014  . Hidradenitis suppurativa 10/09/2014  . Menorrhagia with irregular cycle 06/11/2015  . Morbid obesity (HCC) 10/09/2014  . MRSA infection 10/09/2014  . Pregnancy induced hypertension   . Tachycardia    OB History  Gravida Para Term Preterm AB Living  5 1 0 1 3 1   SAB TAB Ectopic Multiple Live Births  2 0 1 0 1    # Outcome Date GA Lbr Len/2nd Weight Sex Delivery Anes PTL Lv  5 Current           4 Ectopic 06/2016          3 SAB 04/2016             Birth Comments: blighted ovum, GS 8057w1d, cytotec  2 Preterm 04/23/12 1728w4d  2995 g M CS-LTranv EPI  LIV     Complications: Failure to Progress in First Stage, Preeclampsia  1 SAB 2013           Past Surgical History:  Procedure Laterality Date  . CESAREAN SECTION N/A 04/23/2012   Procedure: CESAREAN SECTION;  Surgeon: Tereso NewcomerUgonna A Anyanwu, MD;  Location: [facility];  Service: Obstetrics;  Laterality: N/A;  . DILATION AND CURETTAGE OF UTERUS N/A 07/07/2016   Procedure: SUCTION DILATATION AND CURETTAGE;  Surgeon: Tilda BurrowFerguson, John V, MD;  Location: [facility];  Service: Gynecology;  Laterality: N/A;  . LAPAROSCOPIC ROUX-EN-Y GASTRIC BYPASS WITH UPPER ENDOSCOPY AND REMOVAL OF LAP BAND  06/08/2017   Family History  Problem Relation Age of Onset  . HIV Mother   . Cancer Mother   . Heart Problems Paternal Grandfather   . Diabetes Paternal Uncle     Social History   Tobacco Use  . Smoking status: Never Smoker  . Smokeless tobacco: Never Used  Substance Use Topics  . Alcohol use: No    Alcohol/week: 0.0 standard drinks  . Drug use: No   No Known Allergies Medications Prior to Admission  Medication Sig Dispense Refill Last Dose  . acetaminophen (TYLENOL) 500 MG tablet Take 1,000 mg by mouth every 6 (six) hours as needed (pain).    06/30/2018 at Unknown time  . Cholecalciferol (VITAMIN D3) 50 MCG (2000 UT) TABS Take 4,000 Units by mouth daily.   06/30/2018 at Unknown time  . ferrous sulfate 325 (65 FE) MG tablet Take 325 mg by mouth 2 (two) times a day.   06/30/2018 at Unknown time  . ondansetron (ZOFRAN-ODT) 4 MG disintegrating tablet Take 1 tablet (4 mg total) by mouth 2 (two) times daily. (Patient taking differently: Take 4 mg by mouth 2 (two) times daily as needed for nausea or vomiting. ) 20 tablet 1 06/30/2018 at Unknown time  . pantoprazole (PROTONIX) 40 MG tablet Take 1 tablet (40 mg total) by mouth daily. 30 tablet 4 06/30/2018 at Unknown time  . Prenatal Vit-Fe Fumarate-FA (PRENATAL VITAMIN PO) Take 1 tablet by mouth daily.  06/30/2018 at Unknown time  . aspirin EC 81 MG tablet Take 162 mg by mouth daily.      . butalbital-acetaminophen-caffeine (FIORICET, ESGIC) 50-325-40 MG tablet Take 1 tablet by mouth every 6 (six) hours as needed for headache. (Patient not taking: Reported on 06/28/2018) 20 tablet 0   . cephALEXin (KEFLEX) 500 MG capsule Take 1 capsule (500 mg total) by mouth 4 (four) times daily. (Patient not taking: Reported on 06/21/2018) 28 capsule 0     I have reviewed patient's Past Medical Hx, Surgical Hx, Family Hx, Social Hx, medications and allergies.   ROS:  Review of Systems  Constitutional: Negative.   Gastrointestinal: Positive for diarrhea, nausea and vomiting. Negative for abdominal pain, anal bleeding, blood in stool and rectal pain.  Genitourinary: Negative.     Physical Exam   Patient Vitals for  the past 24 hrs:  BP Temp Pulse Resp SpO2 Weight  06/30/18 2041 131/83 - 74 - - -  06/30/18 2035 133/77 - 74 - - -  06/30/18 2028 133/77 98.7 F (37.1 C) 76 16 100 % -  06/30/18 2022 - - - - - 80 kg    Constitutional: Well-developed, well-nourished female in no acute distress.  Cardiovascular: normal rate & rhythm, no murmur Respiratory: normal effort, lung sounds clear throughout GI: Abd soft, non-tender, gravid appropriate for gestational age. Pos BS x 4 MS: Extremities nontender, no edema, normal ROM Neurologic: Alert and oriented x 4.   NST:  Baseline: 135 bpm, Variability: Good {> 6 bpm), Accelerations: Reactive and Decelerations: Absent   Labs: Results for orders placed or performed during the hospital encounter of 06/30/18 (from the past 24 hour(s))  Urinalysis, Routine w reflex microscopic     Status: Abnormal   Collection Time: 06/30/18  8:30 PM  Result Value Ref Range   Color, Urine YELLOW YELLOW   APPearance HAZY (A) CLEAR   Specific Gravity, Urine 1.014 1.005 - 1.030   pH 6.0 5.0 - 8.0   Glucose, UA NEGATIVE NEGATIVE mg/dL   Hgb urine dipstick MODERATE (A) NEGATIVE   Bilirubin Urine NEGATIVE NEGATIVE   Ketones, ur NEGATIVE NEGATIVE mg/dL   Protein, ur NEGATIVE NEGATIVE mg/dL   Nitrite NEGATIVE NEGATIVE   Leukocytes,Ua NEGATIVE NEGATIVE   RBC / HPF 21-50 0 - 5 RBC/hpf   WBC, UA 0-5 0 - 5 WBC/hpf   Bacteria, UA RARE (A) NONE SEEN   Squamous Epithelial / LPF 0-5 0 - 5   Mucus PRESENT    Ca Oxalate Crys, UA PRESENT     Imaging:  No results found.  MAU Course: Orders Placed This Encounter  Procedures  . SARS Coronavirus 2 (Performed in [facility] hospital lab)  . Urinalysis, Routine w reflex microscopic  . Discharge patient   Meds ordered this encounter  Medications  . ondansetron (ZOFRAN-ODT) disintegrating tablet 8 mg    MDM: Reactive NST  No vomiting or diarrhea while in MAU No signs of dehydration per u/a or vital signs  ODT zofran given in  MAU  Assessment: 1. Gastroenteritis presumed infectious   2. [redacted] weeks gestation of pregnancy     Plan: Discharge home in stable condition.  Labor precautions and fetal kick counts BRAT diet Continue zofran at home prn  Follow-up Information    [facility] Follow up.   Specialty: Obstetrics and Gynecology Why: For worsening symptoms or signs of labor Contact information: [address] [id number] [place] [phone]  Allergies as of 06/30/2018   No Known Allergies     Medication List    STOP taking these medications   aspirin EC 81 MG tablet   butalbital-acetaminophen-caffeine 50-325-40 MG tablet Commonly known as: FIORICET   cephALEXin 500 MG capsule Commonly known as: KEFLEX     TAKE these medications   acetaminophen 500 MG tablet Commonly known as: TYLENOL Take 1,000 mg by mouth every 6 (six) hours as needed (pain).   ferrous sulfate 325 (65 FE) MG tablet Take 325 mg by mouth 2 (two) times a day.   ondansetron 4 MG disintegrating tablet Commonly known as: ZOFRAN-ODT Take 1 tablet (4 mg total) by mouth 2 (two) times daily. What changed:   when to take this  reasons to take this   pantoprazole 40 MG tablet Commonly known as: PROTONIX Take 1 tablet (40 mg total) by mouth daily.   PRENATAL VITAMIN PO Take 1 tablet by mouth daily.   Vitamin D3 50 MCG (2000 UT) Tabs Take 4,000 Units by mouth daily.       Judeth HornLawrence, Julian Askin, NP 06/30/2018 9:43 PM

## 2018-06-30 NOTE — MAU Note (Signed)
Pt states that at around 0200-0300 she got a headache and then she started having vomiting and diarrhea.  Pt states she has had the symptoms all day today.   Denies vaginal bleeding or LOF. Reports +FM

## 2018-07-01 ENCOUNTER — Other Ambulatory Visit (HOSPITAL_COMMUNITY)
Admission: RE | Admit: 2018-07-01 | Discharge: 2018-07-01 | Disposition: A | Discharge status: Home / Self Care | Payer: Medicaid Other | Source: Ambulatory Visit | Attending: *Deleted | Admitting: *Deleted

## 2018-07-01 ENCOUNTER — Encounter: Payer: Self-pay | Admitting: *Deleted

## 2018-07-01 ENCOUNTER — Telehealth: Payer: Self-pay | Admitting: Women's Health

## 2018-07-01 LAB — SARS CORONAVIRUS 2 (TAT 6-24 HRS): SARS Coronavirus 2: NEGATIVE

## 2018-07-01 NOTE — Telephone Encounter (Signed)
Please call pt has a c section on Sunday and was in er last night / was to go for covid testing this am and they did at er and said to call us about getting getting a soap she has to use before she goes please call and advise

## 2018-07-04 ENCOUNTER — Inpatient Hospital Stay (HOSPITAL_COMMUNITY): Payer: BC Managed Care – PPO | Admitting: Anesthesiology

## 2018-07-04 ENCOUNTER — Encounter (HOSPITAL_COMMUNITY)
Admission: RE | Disposition: A | Discharge status: Home / Self Care | Payer: Self-pay | Source: Home / Self Care | Attending: Obstetrics and Gynecology

## 2018-07-04 ENCOUNTER — Other Ambulatory Visit: Payer: Self-pay

## 2018-07-04 ENCOUNTER — Encounter (HOSPITAL_COMMUNITY): Payer: Self-pay | Admitting: *Deleted

## 2018-07-04 ENCOUNTER — Inpatient Hospital Stay (HOSPITAL_COMMUNITY)
Admission: RE | Admit: 2018-07-04 | Discharge: 2018-07-07 | DRG: 788 | Disposition: A | Discharge status: Home / Self Care | Payer: BC Managed Care – PPO | Attending: Obstetrics and Gynecology | Admitting: Obstetrics and Gynecology

## 2018-07-04 DIAGNOSIS — Z3483 Encounter for supervision of other normal pregnancy, third trimester: Secondary | ICD-10-CM

## 2018-07-04 DIAGNOSIS — Z98891 History of uterine scar from previous surgery: Secondary | ICD-10-CM

## 2018-07-04 DIAGNOSIS — O99019 Anemia complicating pregnancy, unspecified trimester: Secondary | ICD-10-CM | POA: Diagnosis present

## 2018-07-04 DIAGNOSIS — O99844 Bariatric surgery status complicating childbirth: Secondary | ICD-10-CM | POA: Diagnosis not present

## 2018-07-04 DIAGNOSIS — R002 Palpitations: Secondary | ICD-10-CM

## 2018-07-04 DIAGNOSIS — O9902 Anemia complicating childbirth: Secondary | ICD-10-CM | POA: Diagnosis not present

## 2018-07-04 DIAGNOSIS — Z3A39 39 weeks gestation of pregnancy: Secondary | ICD-10-CM

## 2018-07-04 DIAGNOSIS — D649 Anemia, unspecified: Secondary | ICD-10-CM | POA: Diagnosis present

## 2018-07-04 DIAGNOSIS — Z8679 Personal history of other diseases of the circulatory system: Secondary | ICD-10-CM

## 2018-07-04 DIAGNOSIS — Z30017 Encounter for initial prescription of implantable subdermal contraceptive: Secondary | ICD-10-CM | POA: Diagnosis not present

## 2018-07-04 DIAGNOSIS — O34211 Maternal care for low transverse scar from previous cesarean delivery: Secondary | ICD-10-CM | POA: Diagnosis not present

## 2018-07-04 DIAGNOSIS — Z8751 Personal history of pre-term labor: Secondary | ICD-10-CM

## 2018-07-04 DIAGNOSIS — Z9884 Bariatric surgery status: Secondary | ICD-10-CM

## 2018-07-04 DIAGNOSIS — Z3490 Encounter for supervision of normal pregnancy, unspecified, unspecified trimester: Secondary | ICD-10-CM

## 2018-07-04 DIAGNOSIS — O09299 Supervision of pregnancy with other poor reproductive or obstetric history, unspecified trimester: Secondary | ICD-10-CM

## 2018-07-04 LAB — CBC
HCT: 34.4 % — ABNORMAL LOW (ref 36.0–46.0)
HCT: 31.6 % — ABNORMAL LOW (ref 36.0–46.0)
Hemoglobin: 10.8 g/dL — ABNORMAL LOW (ref 12.0–15.0)
Hemoglobin: 9.9 g/dL — ABNORMAL LOW (ref 12.0–15.0)
MCH: 25.7 pg — ABNORMAL LOW (ref 26.0–34.0)
MCH: 25.6 pg — ABNORMAL LOW (ref 26.0–34.0)
MCHC: 31.4 g/dL (ref 30.0–36.0)
MCHC: 31.3 g/dL (ref 30.0–36.0)
MCV: 81.9 fL (ref 80.0–100.0)
MCV: 81.9 fL (ref 80.0–100.0)
Platelets: 225 10*3/uL (ref 150–400)
Platelets: 211 10*3/uL (ref 150–400)
RBC: 4.2 MIL/uL (ref 3.87–5.11)
RBC: 3.86 MIL/uL — ABNORMAL LOW (ref 3.87–5.11)
WBC: 8.9 10*3/uL (ref 4.0–10.5)
WBC: 15.7 10*3/uL — ABNORMAL HIGH (ref 4.0–10.5)
nRBC: 0 % (ref 0.0–0.2)
nRBC: 0 % (ref 0.0–0.2)

## 2018-07-04 LAB — CREATININE, SERUM
Creatinine, Ser: 0.47 mg/dL (ref 0.44–1.00)
GFR calc Af Amer: >60 mL/min (ref >60)
GFR calc non Af Amer: >60 mL/min (ref >60)

## 2018-07-04 LAB — PREPARE RBC (CROSSMATCH)

## 2018-07-04 SURGERY — Surgical Case
Anesthesia: Spinal | Wound class: Clean Contaminated

## 2018-07-04 MED ORDER — PHENYLEPHRINE 40 MCG/ML (10ML) SYRINGE FOR IV PUSH (FOR BLOOD PRESSURE SUPPORT)
PREFILLED_SYRINGE | INTRAVENOUS | Status: AC
  Filled 2018-07-04: qty 10

## 2018-07-04 MED ORDER — OXYTOCIN 40 UNITS IN NORMAL SALINE INFUSION - SIMPLE MED
INTRAVENOUS | Status: AC
  Filled 2018-07-04: qty 1000

## 2018-07-04 MED ORDER — HYDROMORPHONE HCL 1 MG/ML IJ SOLN
INTRAMUSCULAR | Status: AC
  Filled 2018-07-04: qty 1

## 2018-07-04 MED ORDER — MORPHINE SULFATE (PF) 0.5 MG/ML IJ SOLN
INTRAMUSCULAR | Status: AC
  Filled 2018-07-04: qty 10

## 2018-07-04 MED ORDER — DIBUCAINE (PERIANAL) 1 % EX OINT: 1.0000 "application " | TOPICAL_OINTMENT | CUTANEOUS | Status: DC | PRN

## 2018-07-04 MED ORDER — DIPHENHYDRAMINE HCL 50 MG/ML IJ SOLN: 12.5000 mg | INTRAMUSCULAR | Status: DC | PRN

## 2018-07-04 MED ORDER — FENTANYL CITRATE (PF) 100 MCG/2ML IJ SOLN
INTRAMUSCULAR | Status: AC
  Filled 2018-07-04: qty 2

## 2018-07-04 MED ORDER — NALBUPHINE HCL 10 MG/ML IJ SOLN: 5.0000 mg | INTRAMUSCULAR | Status: DC | PRN

## 2018-07-04 MED ORDER — SODIUM CHLORIDE 0.9 % IV SOLN
INTRAVENOUS | Status: DC | PRN
  Administered 2018-07-04: 11:00:00 via INTRAVENOUS

## 2018-07-04 MED ORDER — ACETAMINOPHEN 325 MG PO TABS
650.0000 mg | ORAL_TABLET | ORAL | Status: DC | PRN
  Administered 2018-07-04 – 2018-07-07 (×10): 650 mg via ORAL
  Filled 2018-07-04 (×11): qty 2

## 2018-07-04 MED ORDER — SCOPOLAMINE 1 MG/3DAYS TD PT72
1.0000 | MEDICATED_PATCH | Freq: Once | TRANSDERMAL | Status: AC
  Administered 2018-07-04: 1.5 mg via TRANSDERMAL

## 2018-07-04 MED ORDER — TETANUS-DIPHTH-ACELL PERTUSSIS 5-2.5-18.5 LF-MCG/0.5 IM SUSP: 0.5000 mL | Freq: Once | INTRAMUSCULAR | Status: DC

## 2018-07-04 MED ORDER — WITCH HAZEL-GLYCERIN EX PADS: 1.0000 "application " | MEDICATED_PAD | CUTANEOUS | Status: DC | PRN

## 2018-07-04 MED ORDER — SIMETHICONE 80 MG PO CHEW
80.0000 mg | CHEWABLE_TABLET | ORAL | Status: DC
  Administered 2018-07-04 – 2018-07-07 (×3): 80 mg via ORAL
  Filled 2018-07-04 (×3): qty 1

## 2018-07-04 MED ORDER — PHENYLEPHRINE HCL (PRESSORS) 10 MG/ML IV SOLN
INTRAVENOUS | Status: DC | PRN
  Administered 2018-07-04: 80 ug via INTRAVENOUS

## 2018-07-04 MED ORDER — SOD CITRATE-CITRIC ACID 500-334 MG/5ML PO SOLN: 30.0000 mL | ORAL | Status: DC

## 2018-07-04 MED ORDER — EPHEDRINE SULFATE 50 MG/ML IJ SOLN
INTRAMUSCULAR | Status: DC | PRN
  Administered 2018-07-04: 10 mg via INTRAVENOUS

## 2018-07-04 MED ORDER — MENTHOL 3 MG MT LOZG: 1.0000 | LOZENGE | OROMUCOSAL | Status: DC | PRN

## 2018-07-04 MED ORDER — ENOXAPARIN SODIUM 40 MG/0.4ML ~~LOC~~ SOLN
40.0000 mg | SUBCUTANEOUS | Status: DC
  Administered 2018-07-05 – 2018-07-07 (×3): 40 mg via SUBCUTANEOUS
  Filled 2018-07-04 (×3): qty 0.4

## 2018-07-04 MED ORDER — SODIUM CHLORIDE 0.9 % IV SOLN
INTRAVENOUS | Status: DC | PRN
  Administered 2018-07-04: 60 ug/min via INTRAVENOUS

## 2018-07-04 MED ORDER — SENNOSIDES-DOCUSATE SODIUM 8.6-50 MG PO TABS
2.0000 | ORAL_TABLET | ORAL | Status: DC
  Administered 2018-07-04 – 2018-07-07 (×3): 2 via ORAL
  Filled 2018-07-04 (×4): qty 2

## 2018-07-04 MED ORDER — CEFAZOLIN SODIUM-DEXTROSE 2-4 GM/100ML-% IV SOLN
2.0000 g | INTRAVENOUS | Status: AC
  Administered 2018-07-04: 2 g via INTRAVENOUS

## 2018-07-04 MED ORDER — NALBUPHINE HCL 10 MG/ML IJ SOLN: 5.0000 mg | Freq: Once | INTRAMUSCULAR | Status: DC | PRN

## 2018-07-04 MED ORDER — OXYTOCIN 10 UNIT/ML IJ SOLN
INTRAMUSCULAR | Status: DC | PRN
  Administered 2018-07-04: 40 [IU]

## 2018-07-04 MED ORDER — OXYCODONE HCL 5 MG PO TABS
5.0000 mg | ORAL_TABLET | ORAL | Status: DC | PRN
  Administered 2018-07-05 – 2018-07-07 (×7): 5 mg via ORAL
  Filled 2018-07-04 (×7): qty 1

## 2018-07-04 MED ORDER — DIPHENHYDRAMINE HCL 25 MG PO CAPS: 25.0000 mg | ORAL_CAPSULE | Freq: Four times a day (QID) | ORAL | Status: DC | PRN

## 2018-07-04 MED ORDER — ONDANSETRON HCL 4 MG/2ML IJ SOLN: 4.0000 mg | Freq: Three times a day (TID) | INTRAMUSCULAR | Status: DC | PRN

## 2018-07-04 MED ORDER — PROMETHAZINE HCL 25 MG/ML IJ SOLN
INTRAMUSCULAR | Status: AC
  Filled 2018-07-04: qty 1

## 2018-07-04 MED ORDER — SIMETHICONE 80 MG PO CHEW
80.0000 mg | CHEWABLE_TABLET | Freq: Three times a day (TID) | ORAL | Status: DC
  Administered 2018-07-04 – 2018-07-07 (×6): 80 mg via ORAL
  Filled 2018-07-04 (×7): qty 1

## 2018-07-04 MED ORDER — OXYCODONE HCL 5 MG PO TABS: 5.0000 mg | ORAL_TABLET | Freq: Once | ORAL | Status: DC | PRN

## 2018-07-04 MED ORDER — OXYTOCIN 40 UNITS IN NORMAL SALINE INFUSION - SIMPLE MED: 2.5000 [IU]/h | INTRAVENOUS | Status: AC

## 2018-07-04 MED ORDER — DIPHENHYDRAMINE HCL 25 MG PO CAPS: 25.0000 mg | ORAL_CAPSULE | ORAL | Status: DC | PRN

## 2018-07-04 MED ORDER — MORPHINE SULFATE (PF) 0.5 MG/ML IJ SOLN
INTRAMUSCULAR | Status: DC | PRN
  Administered 2018-07-04: 150 ug via INTRATHECAL

## 2018-07-04 MED ORDER — HYDROMORPHONE HCL 1 MG/ML IJ SOLN
0.2500 mg | INTRAMUSCULAR | Status: DC | PRN
  Administered 2018-07-04: 0.25 mg via INTRAVENOUS

## 2018-07-04 MED ORDER — NALOXONE HCL 4 MG/10ML IJ SOLN
1.0000 ug/kg/h | INTRAVENOUS | Status: DC | PRN
  Filled 2018-07-04: qty 5

## 2018-07-04 MED ORDER — KETOROLAC TROMETHAMINE 30 MG/ML IJ SOLN: 30.0000 mg | Freq: Once | INTRAMUSCULAR | Status: DC | PRN

## 2018-07-04 MED ORDER — SODIUM CHLORIDE 0.9% FLUSH: 3.0000 mL | INTRAVENOUS | Status: DC | PRN

## 2018-07-04 MED ORDER — EPHEDRINE 5 MG/ML INJ
INTRAVENOUS | Status: AC
  Filled 2018-07-04: qty 10

## 2018-07-04 MED ORDER — PRENATAL MULTIVITAMIN CH
1.0000 | ORAL_TABLET | Freq: Every day | ORAL | Status: DC
  Administered 2018-07-05 – 2018-07-06 (×2): 1 via ORAL
  Filled 2018-07-04 (×2): qty 1

## 2018-07-04 MED ORDER — MAGNESIUM HYDROXIDE 400 MG/5ML PO SUSP: 30.0000 mL | ORAL | Status: DC | PRN

## 2018-07-04 MED ORDER — ZOLPIDEM TARTRATE 5 MG PO TABS: 5.0000 mg | ORAL_TABLET | Freq: Every evening | ORAL | Status: DC | PRN

## 2018-07-04 MED ORDER — BUPIVACAINE HCL (PF) 0.25 % IJ SOLN
INTRAMUSCULAR | Status: DC | PRN
  Administered 2018-07-04: 1.6 mL via INTRATHECAL

## 2018-07-04 MED ORDER — FENTANYL CITRATE (PF) 100 MCG/2ML IJ SOLN
INTRAMUSCULAR | Status: DC | PRN
  Administered 2018-07-04: 15 ug via INTRATHECAL
  Administered 2018-07-04: 85 ug via INTRAVENOUS

## 2018-07-04 MED ORDER — LACTATED RINGERS IV SOLN
INTRAVENOUS | Status: DC
  Administered 2018-07-04: 1000 mL via INTRAVENOUS
  Administered 2018-07-04: 10:00:00 via INTRAVENOUS

## 2018-07-04 MED ORDER — ONDANSETRON HCL 4 MG/2ML IJ SOLN
INTRAMUSCULAR | Status: DC | PRN
  Administered 2018-07-04: 4 mg via INTRAVENOUS

## 2018-07-04 MED ORDER — SCOPOLAMINE 1 MG/3DAYS TD PT72
MEDICATED_PATCH | TRANSDERMAL | Status: AC
  Filled 2018-07-04: qty 1

## 2018-07-04 MED ORDER — COCONUT OIL OIL: 1.0000 "application " | TOPICAL_OIL | Status: DC | PRN

## 2018-07-04 MED ORDER — LACTATED RINGERS IV SOLN: INTRAVENOUS | Status: DC

## 2018-07-04 MED ORDER — SIMETHICONE 80 MG PO CHEW: 80.0000 mg | CHEWABLE_TABLET | ORAL | Status: DC | PRN

## 2018-07-04 MED ORDER — NALOXONE HCL 0.4 MG/ML IJ SOLN: 0.4000 mg | INTRAMUSCULAR | Status: DC | PRN

## 2018-07-04 MED ORDER — MEPERIDINE HCL 25 MG/ML IJ SOLN: 6.2500 mg | INTRAMUSCULAR | Status: DC | PRN

## 2018-07-04 MED ORDER — IBUPROFEN 800 MG PO TABS
800.0000 mg | ORAL_TABLET | Freq: Three times a day (TID) | ORAL | Status: DC
  Filled 2018-07-04: qty 1

## 2018-07-04 MED ORDER — SODIUM CHLORIDE 0.9% IV SOLUTION: Freq: Once | INTRAVENOUS | Status: DC

## 2018-07-04 MED ORDER — PROMETHAZINE HCL 25 MG/ML IJ SOLN
6.2500 mg | INTRAMUSCULAR | Status: DC | PRN
  Administered 2018-07-04: 6.25 mg via INTRAVENOUS

## 2018-07-04 MED ORDER — OXYCODONE HCL 5 MG/5ML PO SOLN: 5.0000 mg | Freq: Once | ORAL | Status: DC | PRN

## 2018-07-04 MED ORDER — BUPIVACAINE HCL (PF) 0.5 % IJ SOLN
INTRAMUSCULAR | Status: AC
  Filled 2018-07-04: qty 30

## 2018-07-04 SURGICAL SUPPLY — 35 items
APL SKNCLS STERI-STRIP NONHPOA (GAUZE/BANDAGES/DRESSINGS)
BENZOIN TINCTURE PRP APPL 2/3 (GAUZE/BANDAGES/DRESSINGS) IMPLANT
CHLORAPREP W/TINT 26ML (MISCELLANEOUS) ×2 IMPLANT
CLAMP CORD UMBIL (MISCELLANEOUS) IMPLANT
CLOTH BEACON ORANGE TIMEOUT ST (SAFETY) ×2 IMPLANT
DRSG OPSITE POSTOP 4X10 (GAUZE/BANDAGES/DRESSINGS) ×2 IMPLANT
ELECT REM PT RETURN 9FT ADLT (ELECTROSURGICAL) ×2
ELECTRODE REM PT RTRN 9FT ADLT (ELECTROSURGICAL) ×1 IMPLANT
EXTRACTOR VACUUM BELL STYLE (SUCTIONS) IMPLANT
GLOVE BIOGEL PI IND STRL 6.5 (GLOVE) ×1 IMPLANT
GLOVE BIOGEL PI IND STRL 7.0 (GLOVE) ×2 IMPLANT
GLOVE BIOGEL PI INDICATOR 6.5 (GLOVE) ×1
GLOVE BIOGEL PI INDICATOR 7.0 (GLOVE) ×2
GLOVE ORTHOPEDIC STR SZ6.5 (GLOVE) ×2 IMPLANT
GOWN STRL REUS W/TWL LRG LVL3 (GOWN DISPOSABLE) ×6 IMPLANT
HEMOSTAT ARISTA ABSORB 3G PWDR (HEMOSTASIS) ×1 IMPLANT
KIT ABG SYR 3ML LUER SLIP (SYRINGE) IMPLANT
NDL HYPO 25X1 1.5 SAFETY (NEEDLE) IMPLANT
NEEDLE HYPO 22GX1.5 SAFETY (NEEDLE) ×2 IMPLANT
NEEDLE HYPO 25X1 1.5 SAFETY (NEEDLE) IMPLANT
NS IRRIG 1000ML POUR BTL (IV SOLUTION) ×2 IMPLANT
PACK C SECTION WH (CUSTOM PROCEDURE TRAY) ×2 IMPLANT
PAD OB MATERNITY 4.3X12.25 (PERSONAL CARE ITEMS) ×2 IMPLANT
PENCIL SMOKE EVAC W/HOLSTER (ELECTROSURGICAL) ×2 IMPLANT
STRIP CLOSURE SKIN 1/2X4 (GAUZE/BANDAGES/DRESSINGS) IMPLANT
SUT MON AB 4-0 PS1 27 (SUTURE) ×2 IMPLANT
SUT PLAIN 2 0 (SUTURE) ×2
SUT PLAIN ABS 2-0 CT1 27XMFL (SUTURE) ×1 IMPLANT
SUT VIC AB 0 CT1 36 (SUTURE) ×4 IMPLANT
SUT VIC AB 0 CTX 36 (SUTURE) ×2
SUT VIC AB 0 CTX36XBRD ANBCTRL (SUTURE) ×1 IMPLANT
SYR CONTROL 10ML LL (SYRINGE) ×2 IMPLANT
TOWEL OR 17X24 6PK STRL BLUE (TOWEL DISPOSABLE) ×2 IMPLANT
TRAY FOLEY W/BAG SLVR 14FR LF (SET/KITS/TRAYS/PACK) ×2 IMPLANT
WATER STERILE IRR 1000ML POUR (IV SOLUTION) ×2 IMPLANT

## 2018-07-04 NOTE — Transfer of Care (Signed)
Immediate Anesthesia Transfer of Care Note  Patient: Alexis Montgomery  Procedure(s) Performed: CESAREAN SECTION (N/A )  Patient Location: PACU  Anesthesia Type:Spinal  Level of Consciousness: awake  Airway & Oxygen Therapy: Patient Spontanous Breathing  Post-op Assessment: Report given to RN and Post -op Vital signs reviewed and stable  Post vital signs: Reviewed and stable  Last Vitals:  Vitals Value Taken Time  BP 167/79 07/04/18 1146  Temp 36.9 C 07/04/18 1146  Pulse 72 07/04/18 1153  Resp 20 07/04/18 1153  SpO2 100 % 07/04/18 1153  Vitals shown include unvalidated device data.  Last Pain:  Vitals:   07/04/18 1146  TempSrc: Oral  PainSc:          Complications: No apparent anesthesia complications

## 2018-07-04 NOTE — Anesthesia Procedure Notes (Signed)
Spinal  Patient location during procedure: OB Start time: 07/04/2018 10:16 AM End time: 07/04/2018 10:21 AM Staffing Anesthesiologist: Lynda Rainwater, MD Performed: anesthesiologist  Preanesthetic Checklist Completed: patient identified, surgical consent, pre-op evaluation, timeout performed, IV checked, risks and benefits discussed and monitors and equipment checked Spinal Block Patient position: sitting Prep: site prepped and draped and DuraPrep Patient monitoring: heart rate, cardiac monitor, continuous pulse ox and blood pressure Approach: midline Location: L3-4 Injection technique: single-shot Needle Needle type: Pencan  Needle gauge: 24 G Needle length: 10 cm Assessment Sensory level: T4

## 2018-07-04 NOTE — Anesthesia Preprocedure Evaluation (Signed)
Anesthesia Evaluation  Patient identified by MRN, date of birth, ID band Patient awake    Reviewed: Allergy & Precautions, H&P , Patient's Chart, lab work & pertinent test results  Airway Mallampati: III  TM Distance: >3 FB Neck ROM: full    Dental  (+) Teeth Intact   Pulmonary    breath sounds clear to auscultation       Cardiovascular hypertension, On Medications  Rhythm:regular Rate:Normal     Neuro/Psych    GI/Hepatic   Endo/Other    Renal/GU      Musculoskeletal   Abdominal   Peds  Hematology   Anesthesia Other Findings   Pregnancy induced hypertension     On Mg+2, and labatolol     Reproductive/Obstetrics (+) Pregnancy                             Anesthesia Physical  Anesthesia Plan  ASA: II  Anesthesia Plan: Spinal   Post-op Pain Management:    Induction:   PONV Risk Score and Plan: 2 and Treatment may vary due to age or medical condition  Airway Management Planned: Natural Airway  Additional Equipment:   Intra-op Plan:   Post-operative Plan: Extubation in OR  Informed Consent: I have reviewed the patients History and Physical, chart, labs and discussed the procedure including the risks, benefits and alternatives for the proposed anesthesia with the patient or authorized representative who has indicated his/her understanding and acceptance.     Dental Advisory Given  Plan Discussed with: CRNA  Anesthesia Plan Comments: (Labs checked- platelets confirmed with RN in room. Fetal heart tracing, per RN, reported to be stable enough for sitting procedure. Discussed epidural, and patient consents to the procedure:  included risk of possible headache,backache, failed block, allergic reaction, and nerve injury. This patient was asked if she had any questions or concerns before the procedure started. )        Anesthesia Quick Evaluation

## 2018-07-04 NOTE — H&P (Signed)
Obstetric Preoperative History and Physical  Alexis Montgomery is a 28 y.o. [phone] with IUP at [redacted]w[redacted]d presenting for scheduled cesarean section.  No acute concerns.   Prenatal Course Source of Care: FT with onset of care at 10 weeks Pregnancy complications or risks: Patient Active Problem List   Diagnosis Date Noted  . Palpitations 06/21/2018  . Anemia affecting pregnancy 06/07/2018  . Low vitamin D level 12/17/2017  . Supervision of normal pregnancy 12/14/2017  . 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 Kidney stone 07/21/2017  . Postoperative intestinal malabsorption 07/13/2017  . Iron deficiency 05/08/2017  . H/O severe pre-e 03/03/2016  . Previous cesarean section 03/03/2016  . History of preterm delivery 03/03/2016  . Chronic back pain 05/18/2015  . Hidradenitis suppurativa 10/09/2014  . History of chronic hypertension 10/09/2014   She plans to breastfeed She desires Nexplanon for postpartum contraception.   Prenatal labs and studies: ABO, Rh: --/--/A POS, A POS Performed at [facility], [place] [address], [place], [place] [address]  [phone]/28 0725) Antibody: NEG (06/28 0725) Rubella: 4.12 (12/09 1239) RPR: Non Reactive (04/06 1046)  HBsAg: Negative (12/09 1239)  HIV: Non Reactive (04/06 1046)  OZH:YQMVHQIO (06/15 1500) A1c: < 4.2 Genetic screening normal Anatomy US normal  Prenatal Transfer Tool  Fetal Ultrasounds or other Referrals:  None Maternal Substance Abuse:  No Significant Maternal Medications:  None Significant Maternal Lab Results: None  Past Medical History:  Diagnosis Date  . Anemia   . Benign essential HTN 10/09/2014  . Hidradenitis suppurativa 10/09/2014  . Menorrhagia with irregular cycle 06/11/2015  . Morbid obesity ([place]) 10/09/2014  . MRSA infection 10/09/2014  . Pregnancy induced hypertension   . Tachycardia     Past Surgical History:  Procedure Laterality Date  . CESAREAN SECTION N/A 04/23/2012   Procedure: CESAREAN SECTION;   Surgeon: Osborne Oman, MD;  Location: [place] [facility];  Service: Obstetrics;  Laterality: N/A;  . DILATION AND CURETTAGE OF UTERUS N/A 07/07/2016   Procedure: SUCTION DILATATION AND CURETTAGE;  Surgeon: Jonnie Kind, MD;  Location: [facility];  Service: Gynecology;  Laterality: N/A;  . LAPAROSCOPIC ROUX-EN-Y GASTRIC BYPASS WITH UPPER ENDOSCOPY AND REMOVAL OF LAP BAND  06/08/2017    OB History  Gravida Para Term Preterm AB Living  5 1 0 1 3 1   SAB TAB Ectopic Multiple Live Births  2 0 1 0 1    # Outcome Date GA Lbr Len/2nd Weight Sex Delivery Anes PTL Lv  5 Current           4 Ectopic 06/2016          3 SAB 04/2016             Birth Comments: blighted ovum, GS [redacted]w[redacted]d, cytotec  2 Preterm 04/23/12 [redacted]w[redacted]d  2995 g M CS-LTranv EPI  LIV     Complications: Failure to Progress in First Stage, Preeclampsia  1 SAB 2013            Social History   Socioeconomic History  . Marital status: Legally Separated    Spouse name: JUSTIN   . Number of children: 1  . Years of education: Not on file  . Highest education level: High school graduate  Occupational History  . Not on file  Social Needs  . Financial resource strain: Not hard at all  . Food insecurity    Worry: Never true    Inability: Never true  . Transportation needs    Medical:  No    Non-medical: No  Tobacco Use  . Smoking status: Never Smoker  . Smokeless tobacco: Never Used  Substance and Sexual Activity  . Alcohol use: No    Alcohol/week: 0.0 standard drinks  . Drug use: No  . Sexual activity: Yes    Birth control/protection: None  Lifestyle  . Physical activity    Days per week: 0 days    Minutes per session: 0 min  . Stress: Not at all  Relationships  . Social connections    Talks on phone: More than three times a week    Gets together: Three times a week    Attends religious service: More than 4 times per year    Active member of club or organization: No    Attends meetings of clubs or organizations: Never     Relationship status: Separated  Other Topics Concern  . Not on file  Social History Narrative  . Not on file    Family History  Problem Relation Age of Onset  . HIV Mother   . Cancer Mother   . Heart Problems Paternal Grandfather   . Diabetes Paternal Uncle     Medications Prior to Admission  Medication Sig Dispense Refill Last Dose  . acetaminophen (TYLENOL) 500 MG tablet Take 1,000 mg by mouth every 6 (six) hours as needed (pain).      . Cholecalciferol (VITAMIN D3) 50 MCG (2000 UT) TABS Take 4,000 Units by mouth daily.     . ferrous sulfate 325 (65 FE) MG tablet Take 325 mg by mouth 2 (two) times a day.     . ondansetron (ZOFRAN-ODT) 4 MG disintegrating tablet Take 1 tablet (4 mg total) by mouth 2 (two) times daily. (Patient taking differently: Take 4 mg by mouth 2 (two) times daily as needed for nausea or vomiting. ) 20 tablet 1   . pantoprazole (PROTONIX) 40 MG tablet Take 1 tablet (40 mg total) by mouth daily. 30 tablet 4   . Prenatal Vit-Fe Fumarate-FA (PRENATAL VITAMIN PO) Take 1 tablet by mouth daily.        No Known Allergies  Review of Systems: Negative except for what is mentioned in HPI.  Physical Exam: BP 130/73   Pulse 88   Temp 98.6 F (37 C) (Oral)   Resp 17   Ht 5\' 2"  (1.575 m)   Wt 79.8 kg   LMP 10/03/2017 (Exact Date)   BMI 32.19 kg/m  FHR by Doppler: 150 bpm CONSTITUTIONAL: Well-developed, well-nourished female in no acute distress.  HENT:  Normocephalic, atraumatic, External right and left ear normal. Oropharynx is clear and moist EYES: Conjunctivae and EOM are normal. Pupils are equal, round, and reactive to light. No scleral icterus.  NECK: Normal range of motion, supple, no masses SKIN: Skin is warm and dry. No rash noted. Not diaphoretic. No erythema. No pallor. NEUROLGIC: Alert and oriented to person, place, and time. Normal reflexes, muscle tone coordination. No cranial nerve deficit noted. PSYCHIATRIC: Normal mood and affect. Normal  behavior. Normal judgment and thought content. CARDIOVASCULAR: Normal heart rate noted, regular rhythm RESPIRATORY: Effort and breath sounds normal, no problems with respiration noted ABDOMEN: Soft, nontender, nondistended, gravid. Well-healed Pfannenstiel incision. PELVIC: Deferred MUSCULOSKELETAL: Normal range of motion. No edema and no tenderness. 2+ distal pulses.   Pertinent Labs/Studies:   Results for orders placed or performed during the hospital encounter of 07/04/18 (from the past 72 hour(s))  Type and screen [facility]  Status: None   Collection Time: 07/04/18  7:25 AM  Result Value Ref Range   ABO/RH(D) A POS    Antibody Screen NEG    Sample Expiration      07/07/2018,2359 Performed at [facility], [address] [place], [place] [address]   ABO/Rh     Status: None (Preliminary result)   Collection Time: 07/04/18  7:25 AM  Result Value Ref Range   ABO/RH(D)      A POS Performed at [facility], [address] [place], [place] [address]     Assessment and Plan :Verta Ellenshley N Mizer is a 28 y.o. [phone] at 2650w1d being admitted for scheduled repeat cesarean section. No acute issues today  The risks of cesarean section were discussed with the patient; including but not limited to: infection which may require antibiotics; bleeding which may require transfusion or re-operation; injury to bowel, bladder, ureters or other surrounding organs; injury to the fetus; need for additional procedures including hysterectomy in the event of a life-threatening hemorrhage; placental abnormalities wth subsequent pregnancies, risk of needing c-sections in future pregnancies, incisional problems, thromboembolic phenomenon and other postoperative/anesthesia complications. Answered all questions. The patient verbalized understanding of the plan, giving informed consent for the procedure. She is agreeable to blood transfusion in the event of emergency.  Patient has been  NPO since 12 am, she will remain NPO for procedure Anesthesia and OR aware Preoperative prophylactic antibiotics and SCDs ordered on call to the OR  To OR when ready   K. Therese SarahMeryl Roniyah Llorens, M.D. Attending Obstetrician & Gynecologist, [facility], [facility]

## 2018-07-04 NOTE — Anesthesia Postprocedure Evaluation (Signed)
Anesthesia Post Note  Patient: Alexis Montgomery  Procedure(s) Performed: CESAREAN SECTION (N/A )     Patient location during evaluation: PACU Anesthesia Type: Spinal Level of consciousness: oriented and awake and alert Pain management: pain level controlled Vital Signs Assessment: post-procedure vital signs reviewed and stable Respiratory status: spontaneous breathing and respiratory function stable Cardiovascular status: blood pressure returned to baseline and stable Postop Assessment: no headache, no backache and no apparent nausea or vomiting Anesthetic complications: no    Last Vitals:  Vitals:   07/04/18 1245 07/04/18 1309  BP: 122/85 129/85  Pulse: 75 81  Resp: 18 16  Temp:  36.7 C  SpO2: 98% 100%    Last Pain:  Vitals:   07/04/18 1310  TempSrc:   PainSc: 1    Pain Goal:    LLE Motor Response: Purposeful movement (07/04/18 1245)   RLE Motor Response: Purposeful movement (07/04/18 1245)       Epidural/Spinal Function Cutaneous sensation: Tingles (07/04/18 1310), Patient able to flex knees: Yes (07/04/18 1310), Patient able to lift hips off bed: Yes (07/04/18 1310), Back pain beyond tenderness at insertion site: No (07/04/18 1310), Progressively worsening motor and/or sensory loss: No (07/04/18 1310), Bowel and/or bladder incontinence post epidural: No (07/04/18 1310)  Lynda Rainwater

## 2018-07-04 NOTE — Discharge Summary (Addendum)
Postpartum Discharge Summary     Patient Name: Alexis Montgomery DOB: 01/03/1991 MRN: [id number]  Date of admission: 07/04/2018 Delivering Provider: Conan BowensAVIS, KELLY M   Date of discharge: 07/06/2018  Admitting diagnosis: RCS Intrauterine pregnancy: 5531w1d     Secondary diagnosis:  Active Problems:   History of chronic hypertension   H/O severe pre-e   Previous cesarean section   History of preterm delivery   History of Roux-en-Y gastric bypass   Supervision of normal pregnancy   Anemia affecting pregnancy   History of C-section   H/O: C-section    Discharge diagnosis: Term Pregnancy Delivered                                                           Postpartum procedures: inpatient Nexplanon placement   Hospital course:  Sceduled C/S   28 y.o. yo [phone] at 2631w1d was admitted to the hospital 07/04/2018 for scheduled cesarean section with the following indication:Elective Repeat.  Membrane Rupture Time/Date: 10:56 AM ,07/04/2018 . Patient delivered a Viable infant.Details of operation can be found in separate operative note. Patient had an uncomplicated postpartum course. Her HgB trended from 9.9 to 9.3 on POD#1. She is ambulating, tolerating a regular diet, passing flatus, and urinating well. Patient is discharged home in stable condition on  07/06/18         Magnesium Sulfate recieved: No BMZ received: No  Physical exam  Vitals:   07/05/18 0545 07/05/18 1337 07/05/18 2105 07/06/18 0514  BP: 121/73 109/68 121/83 113/76  Pulse: 66 72 72 82  Resp: 17 19 18 16   Temp: 98.4 F (36.9 C) 98.6 F (37 C) 98.5 F (36.9 C) 98 F (36.7 C)  TempSrc: Oral Oral Oral Oral  SpO2: 99% 99% 100% 99%  Weight:      Height:       General: alert, well-appearing, NAD Lochia: appropriate Uterine Fundus: firm Incision: Dressing is clean, dry, and intact - one small area of dried blood  DVT Evaluation: No significant calf/ankle edema. Labs: Lab Results  Component Value Date   WBC 10.4 07/05/2018   HGB 9.3 (L) 07/05/2018   HCT 29.6 (L) 07/05/2018   MCV 82.2 07/05/2018   PLT 194 07/05/2018   CMP Latest Ref Rng & Units 07/04/2018  Glucose 70 - 99 mg/dL -  BUN 6 - 20 mg/dL -  Creatinine 5.620.44 - 1.301.00 mg/dL 8.650.47  Sodium 784135 - 696145 mmol/L -  Potassium 3.5 - 5.1 mmol/L -  Chloride 98 - 111 mmol/L -  CO2 22 - 32 mmol/L -  Calcium 8.9 - 10.3 mg/dL -  Total Protein 6.5 - 8.1 g/dL -  Total Bilirubin 0.3 - 1.2 mg/dL -  Alkaline Phos 38 - 295126 U/L -  AST 15 - 41 U/L -  ALT 0 - 44 U/L -    Discharge instruction: per After Visit Summary and "Baby and Me Booklet".  After visit meds:  Allergies as of 07/06/2018   No Known Allergies     Medication List    TAKE these medications   acetaminophen 500 MG tablet Commonly known as: TYLENOL Take 1,000 mg by mouth every 6 (six) hours as needed (pain).   ferrous sulfate 325 (65 FE) MG tablet Take 325 mg by mouth 2 (two) times a day.   ibuprofen  800 MG tablet Commonly known as: ADVIL Take 1 tablet (800 mg total) by mouth every 8 (eight) hours.   ondansetron 4 MG disintegrating tablet Commonly known as: ZOFRAN-ODT Take 1 tablet (4 mg total) by mouth 2 (two) times daily. What changed:   when to take this  reasons to take this   oxyCODONE 5 MG immediate release tablet Commonly known as: Oxy IR/ROXICODONE Take 1 tablet (5 mg total) by mouth every 6 (six) hours as needed for up to 5 days for breakthrough pain.   pantoprazole 40 MG tablet Commonly known as: PROTONIX Take 1 tablet (40 mg total) by mouth daily.   PRENATAL VITAMIN PO Take 1 tablet by mouth daily.   senna-docusate 8.6-50 MG tablet Commonly known as: Senokot-S Take 2 tablets by mouth at bedtime as needed for mild constipation.   Vitamin D3 50 MCG (2000 UT) Tabs Take 4,000 Units by mouth daily.            Discharge Care Instructions  (From admission, onward)         Start     Ordered   07/06/18 0000  Discharge wound care:    Comments: Keep dressing in place  for about 1 week. It is okay for you to remove it yourself or wait for your incision check in clinic. You should have an incision check 1-2 weeks from your surgery. It is okay for you to shower like normal.   07/06/18 0740          Diet: routine diet  Activity: Advance as tolerated. Pelvic rest for 6 weeks.   Outpatient follow up:1-2 weeks for incision check then in 4-6 weeks for routine postpartum visit Follow up Appt: Future Appointments  Date Time Provider [place]  07/12/2018 11:15 AM Roma Schanz, CNM [facility]   Follow up Visit: Follow-up Information    [facility]. Schedule an appointment as soon as possible for a visit.   Specialty: Obstetrics and Gynecology Why: You should receive a call to schedule a postpartum follow-up visit. If you do not, please call clinic to make an appointment in 4-6 weeks.  Contact information: [address] [place] [phone]        Please schedule this patient for Postpartum visit in: 4 weeks with the following provider: MD For C/S patients schedule nurse incision check in weeks 2 weeks: yes Low risk pregnancy complicated by: h/o HTN Delivery mode:  CS Anticipated Birth Control:  Nexplanon PP Procedures needed: Incision check  Schedule Integrated BH visit: no  Newborn Data: Live born female  Birth Weight: 6 lb 12.1 oz (3065 g) APGAR: 42, 30  Newborn Delivery   Birth date/time: 07/04/2018 10:57:00 Delivery type: C-Section, Low Transverse Trial of labor: No C-section categorization: Repeat    Baby Feeding: Breast Disposition:home with mother   Lambert Mody. Juleen China, DO OB/GYN Fellow  Addendum: Patient not discharged on 6/30 due to baby needing to stay for phototherapy for neonatal jaundice.   Truett Mainland, DO 07/08/2018 8:09 AM

## 2018-07-04 NOTE — Op Note (Signed)
Alexis EllenAshley N Murch PROCEDURE DATE: 07/04/2018  PREOPERATIVE DIAGNOSES: Intrauterine pregnancy at 949w1d weeks gestation; patient declines vag del attempt  POSTOPERATIVE DIAGNOSES: The same  PROCEDURE: Repeat Low Transverse Cesarean Section  SURGEON:  Baldemar LenisK. Meryl Stevey Stapleton, MD  ASSISTANT:  none  ANESTHESIOLOGY TEAM: Anesthesiologist: Lowella CurbMiller, Warren Ray, MD CRNA: Rudi RummageLowder, Hal J, CRNA  INDICATIONS: Alexis Montgomery is a 28 y.o. [phone] at 6849w1d here for cesarean section secondary to the indications listed under preoperative diagnoses; please see preoperative note for further details.  The risks of cesarean section were discussed with the patient including but were not limited to: bleeding which may require transfusion or reoperation; infection which may require antibiotics; injury to bowel, bladder, ureters or other surrounding organs; injury to the fetus; need for additional procedures including hysterectomy in the event of a life-threatening hemorrhage; placental abnormalities wth subsequent pregnancies, incisional problems, thromboembolic phenomenon and other postoperative/anesthesia complications.  She consents to blood transfusion in the event of an emergency. The patient verbalized understanding of the plan, giving informed written consent for the procedure.    FINDINGS:  Viable female infant in cephalic presentation.  Apgars 8 and 9.  Clear amniotic fluid.  Intact placenta, three vessel cord.  Normal uterus, fallopian tubes and ovaries bilaterally.  ANESTHESIA: Spinal INTRAVENOUS FLUIDS: 2000 ml   ESTIMATED BLOOD LOSS: 489 ml URINE OUTPUT:  50 ml SPECIMENS: Placenta sent to L&D COMPLICATIONS: None immediate  PROCEDURE IN DETAIL:  The patient preoperatively received intravenous antibiotics and had sequential compression devices applied to her lower extremities.  She was then taken to the operating room where spinal anesthesia was administered and was found to be adequate. She was then placed in a dorsal  supine position with a leftward tilt, and a Traxi retraction device was placed to good effect on the pannus/abdomen. She was prepped and draped in a sterile manner.  A foley catheter was placed into her bladder with sterile technique and attached to constant gravity.  After a timeout was performed, a Pfannenstiel skin incision was made with scalpel over her preexisting scar and carried through to the underlying layer of fascia. The fascia was incised in the midline, and this incision was extended bilaterally using the Mayo scissors.  Kocher clamps were applied to the superior aspect of the fascial incision and the underlying rectus muscles were dissected off sharply.  A similar process was carried out on the inferior aspect of the fascial incision. The rectus muscles were separated in the midline bluntly and the peritoneum was entered bluntly. The peritoneal incision was carefully extended sharply laterally and caudad with good visualization of the bladder. The uterus appeared normal. The Alexis O-ring retractor was placed into the incision, taking care not to incorporate bowel or omentum. The bladder blade was inserted. Attention was turned to the lower uterine segment where a low transverse hysterotomy was made with a scalpel and extended bilaterally bluntly.  The infant was delivered from vertex position, nose and mouth were bulb suctioned, and the cord clamped and cut after 1 minute. The infant was then handed over to the waiting neonatology team. Uterine massage was then performed, and the placenta delivered intact with a three-vessel cord. The uterus was then gently cleared of clots and debris.  The hysterotomy was closed with 0 Vicryl in a running locked fashion, and an imbricating layer was also placed with 0 Vicryl. The fallopian tubes and ovaries were visualized bilaterally and normal appearing. The Alexis retractor was removed.  The pelvis was cleared of all  clot and debris. Arista was placed over the  hysterotomy for additional hemostasis.  Hemostasis was again confirmed on all surfaces. The fascia was then closed using 0 Vicryl in a running fashion.  The subcutaneous layer was irrigated, then reapproximated with 2-0 plain gut, and 30 ml of 0.5% Marcaine was injected subcutaneously around the incision.  The skin was closed with a 4-0 Monocryl subcuticular stitch. The patient tolerated the procedure well. Sponge, lap, instrument and needle counts were correct x 3.  She was taken to the recovery room in stable condition.    Feliz Beam, M.D. Attending [place], [facility] for [employer], [place]

## 2018-07-05 ENCOUNTER — Encounter (HOSPITAL_COMMUNITY): Payer: Self-pay | Admitting: *Deleted

## 2018-07-05 DIAGNOSIS — Z30017 Encounter for initial prescription of implantable subdermal contraceptive: Secondary | ICD-10-CM

## 2018-07-05 LAB — CBC
HCT: 29.6 % — ABNORMAL LOW (ref 36.0–46.0)
Hemoglobin: 9.3 g/dL — ABNORMAL LOW (ref 12.0–15.0)
MCH: 25.8 pg — ABNORMAL LOW (ref 26.0–34.0)
MCHC: 31.4 g/dL (ref 30.0–36.0)
MCV: 82.2 fL (ref 80.0–100.0)
Platelets: 194 10*3/uL (ref 150–400)
RBC: 3.6 MIL/uL — ABNORMAL LOW (ref 3.87–5.11)
WBC: 10.4 10*3/uL (ref 4.0–10.5)
nRBC: 0 % (ref 0.0–0.2)

## 2018-07-05 LAB — ABO/RH: ABO/RH(D): A POS

## 2018-07-05 LAB — RPR: RPR Ser Ql: NONREACTIVE

## 2018-07-05 LAB — BIRTH TISSUE RECOVERY COLLECTION (PLACENTA DONATION)

## 2018-07-05 MED ORDER — LIDOCAINE HCL 1 % IJ SOLN
0.0000 mL | Freq: Once | INTRAMUSCULAR | Status: AC | PRN
  Administered 2018-07-05: 2 mL via INTRADERMAL
  Filled 2018-07-05: qty 20

## 2018-07-05 MED ORDER — ETONOGESTREL 68 MG ~~LOC~~ IMPL
68.0000 mg | DRUG_IMPLANT | Freq: Once | SUBCUTANEOUS | Status: AC
  Administered 2018-07-05: 68 mg via SUBCUTANEOUS
  Filled 2018-07-05: qty 1

## 2018-07-05 MED ORDER — ONDANSETRON 4 MG PO TBDP
ORAL_TABLET | ORAL | Status: AC
  Administered 2018-07-05: 4 mg
  Filled 2018-07-05: qty 1

## 2018-07-05 MED ORDER — ONDANSETRON HCL 4 MG PO TABS: 4.0000 mg | ORAL_TABLET | Freq: Once | ORAL | Status: DC

## 2018-07-05 NOTE — Progress Notes (Signed)
Post Partum Day 1 Subjective: no complaints, up ad lib, voiding and tolerating PO  Objective: Blood pressure 121/73, pulse 66, temperature 98.4 F (36.9 C), temperature source Oral, resp. rate 17, height 5\' 2"  (1.575 m), weight 79.8 kg, last menstrual period 10/03/2017, SpO2 99 %.  Physical Exam:  General: alert and cooperative Lochia: appropriate Uterine Fundus: firm Incision: no significant drainage DVT Evaluation: No evidence of DVT seen on physical exam.  Recent Labs    07/04/18 1415 07/05/18 0518  HGB 9.9* 9.3*  HCT 31.6* 29.6*    Assessment/Plan: Plan for discharge tomorrow  Nexplanon to be placed today   LOS: 1 day   Jorje Guild 07/05/2018, 11:21 AM

## 2018-07-05 NOTE — Procedures (Signed)
NEXPLANON INSERTION: Appropriate time out taken. Nexlanon site (left arm) identified and the area was prepped in usual sterile fashon. 2 cc of 1% lidocaine was used to anesthetize the area starting with the distal end.   Next, the area was cleansed with betadine and the Nexplanon was inserted without difficulty.  Pressure bandage was applied.  Pt was instructed to remove pressure bandage in a few hours, and keep insertion site covered with a bandaid for 3 days.      Heyden Jaber, NP  

## 2018-07-06 MED ORDER — OXYCODONE HCL 5 MG PO TABS: 5.0000 mg | ORAL_TABLET | Freq: Four times a day (QID) | ORAL | 0 refills | Status: AC | PRN

## 2018-07-06 MED ORDER — PANTOPRAZOLE SODIUM 40 MG PO TBEC
40.0000 mg | DELAYED_RELEASE_TABLET | Freq: Every day | ORAL | Status: DC
  Administered 2018-07-06: 40 mg via ORAL
  Filled 2018-07-06: qty 1

## 2018-07-06 MED ORDER — SENNOSIDES-DOCUSATE SODIUM 8.6-50 MG PO TABS: 2.0000 | ORAL_TABLET | Freq: Every evening | ORAL | Status: DC | PRN

## 2018-07-06 MED ORDER — IBUPROFEN 800 MG PO TABS: 800.0000 mg | ORAL_TABLET | Freq: Three times a day (TID) | ORAL | 0 refills | Status: DC

## 2018-07-07 ENCOUNTER — Encounter (HOSPITAL_COMMUNITY): Payer: Self-pay | Admitting: *Deleted

## 2018-07-07 ENCOUNTER — Telehealth: Payer: Self-pay | Admitting: Obstetrics & Gynecology

## 2018-07-07 NOTE — Telephone Encounter (Signed)
Patient had  A C-section not to long ago and he was suppose to call in some tylenol for her and something for her cold sore. Please contact pt

## 2018-07-07 NOTE — Discharge Summary (Signed)
Postpartum Discharge Summary     Patient Name: Alexis Montgomery DOB: Mar 22, 1990 MRN: [id number]  Date of admission: 07/04/2018 Delivering Provider: Sloan Leiter   Date of discharge: 07/07/2018  Admitting diagnosis: RCS Intrauterine pregnancy: [redacted]w[redacted]d     Secondary diagnosis:  Active Problems:   History of chronic hypertension   H/O severe pre-e   Previous cesarean section   History of preterm delivery   History of Roux-en-Y gastric bypass   Supervision of normal pregnancy   Anemia affecting pregnancy   History of C-section   H/O: C-section  Additional problems: none     Discharge diagnosis: Term Pregnancy Delivered                                                                                                Post partum procedures:none  Augmentation: n/a  Complications: None  Hospital course:  Sceduled C/S   28 y.o. yo [id number] at [redacted]w[redacted]d was admitted to the hospital 07/04/2018 for scheduled cesarean section with the following indication:Elective Repeat.  Membrane Rupture Time/Date: 10:56 AM ,07/04/2018   Patient delivered a Viable infant.07/04/2018  Details of operation can be found in separate operative note.  Pateint had an uncomplicated postpartum course.  She is ambulating, tolerating a regular diet, passing flatus, and urinating well. Patient is discharged home in stable condition on  07/07/18         Magnesium Sulfate recieved: No BMZ received: No  Physical exam  Vitals:   07/06/18 0514 07/06/18 1545 07/06/18 2109 07/07/18 0606  BP: 113/76 118/76 119/78 112/81  Pulse: 82 78 89 82  Resp: 16 16 18 18   Temp: 98 F (36.7 C) 99.5 F (37.5 C) 98.1 F (36.7 C) 98.2 F (36.8 C)  TempSrc: Oral Oral Oral Oral  SpO2: 99%  99% 98%  Weight:      Height:       General: alert, cooperative and no distress Lochia: appropriate Uterine Fundus: firm Incision: Healing well with no significant drainage, No significant erythema, Dressing is clean, dry, and intact DVT Evaluation:  No evidence of DVT seen on physical exam. Negative Homan's sign. No cords or calf tenderness. No significant calf/ankle edema. Labs: Lab Results  Component Value Date   WBC 10.4 07/05/2018   HGB 9.3 (L) 07/05/2018   HCT 29.6 (L) 07/05/2018   MCV 82.2 07/05/2018   PLT 194 07/05/2018   CMP Latest Ref Rng & Units 07/04/2018  Glucose 70 - 99 mg/dL -  BUN 6 - 20 mg/dL -  Creatinine 0.44 - 1.00 mg/dL 0.47  Sodium 135 - 145 mmol/L -  Potassium 3.5 - 5.1 mmol/L -  Chloride 98 - 111 mmol/L -  CO2 22 - 32 mmol/L -  Calcium 8.9 - 10.3 mg/dL -  Total Protein 6.5 - 8.1 g/dL -  Total Bilirubin 0.3 - 1.2 mg/dL -  Alkaline Phos 38 - 126 U/L -  AST 15 - 41 U/L -  ALT 0 - 44 U/L -    Discharge instruction: per After Visit Summary and "Baby and Me Booklet".  After visit meds:  Allergies as  of 07/07/2018   No Known Allergies     Medication List    TAKE these medications   acetaminophen 500 MG tablet Commonly known as: TYLENOL Take 1,000 mg by mouth every 6 (six) hours as needed (pain).   ferrous sulfate 325 (65 FE) MG tablet Take 325 mg by mouth 2 (two) times a day.   ibuprofen 800 MG tablet Commonly known as: ADVIL Take 1 tablet (800 mg total) by mouth every 8 (eight) hours.   ondansetron 4 MG disintegrating tablet Commonly known as: ZOFRAN-ODT Take 1 tablet (4 mg total) by mouth 2 (two) times daily. What changed:   when to take this  reasons to take this   oxyCODONE 5 MG immediate release tablet Commonly known as: Oxy IR/ROXICODONE Take 1 tablet (5 mg total) by mouth every 6 (six) hours as needed for up to 5 days for breakthrough pain.   pantoprazole 40 MG tablet Commonly known as: PROTONIX Take 1 tablet (40 mg total) by mouth daily.   PRENATAL VITAMIN PO Take 1 tablet by mouth daily.   senna-docusate 8.6-50 MG tablet Commonly known as: Senokot-S Take 2 tablets by mouth at bedtime as needed for mild constipation.   Vitamin D3 50 MCG (2000 UT) Tabs Take 4,000  Units by mouth daily.            Discharge Care Instructions  (From admission, onward)         Start     Ordered   07/06/18 0000  Discharge wound care:    Comments: Keep dressing in place for about 1 week. It is okay for you to remove it yourself or wait for your incision check in clinic. You should have an incision check 1-2 weeks from your surgery. It is okay for you to shower like normal.   07/06/18 0740          Diet: routine diet  Activity: Advance as tolerated. Pelvic rest for 6 weeks.   Outpatient follow up:2 weeks Follow up Appt: Future Appointments  Date Time Provider Department Center  07/12/2018 11:15 AM Cheral MarkerBooker, Kimberly R, CNM [facility]   Follow up Visit: Follow-up Information    [facility]. Schedule an appointment as soon as possible for a visit.   Specialty: Obstetrics and Gynecology Why: You should receive a call to schedule a postpartum follow-up visit. If you do not, please call clinic to make an appointment in 4-6 weeks.  Contact information: [address] [place] [phone]            Newborn Data: Live born female  Birth Weight: 6 lb 12.1 oz (3065 g) APGAR: 8, 9  Newborn Delivery   Birth date/time: 07/04/2018 10:57:00 Delivery type: C-Section, Low Transverse Trial of labor: No C-section categorization: Repeat      Baby Feeding: Breast Disposition:home with mother   07/07/2018 Alexis HeritageJacob J Minh [place]anhorse, DO

## 2018-07-07 NOTE — Discharge Instructions (Signed)

## 2018-07-08 ENCOUNTER — Other Ambulatory Visit: Payer: Self-pay | Admitting: Women's Health

## 2018-07-08 LAB — TYPE AND SCREEN
ABO/RH(D): A POS
Antibody Screen: NEGATIVE
Unit division: 0
Unit division: 0

## 2018-07-08 LAB — BPAM RBC
Blood Product Expiration Date: 202007142359
Blood Product Expiration Date: 202007142359
ISSUE DATE / TIME: 202006241911
ISSUE DATE / TIME: 202006241911
Unit Type and Rh: 6200
Unit Type and Rh: 6200

## 2018-07-08 MED ORDER — ABDOMINAL BINDER/ELASTIC LARGE MISC: 0 refills | Status: DC

## 2018-07-12 ENCOUNTER — Other Ambulatory Visit: Payer: Self-pay

## 2018-07-12 ENCOUNTER — Ambulatory Visit (INDEPENDENT_AMBULATORY_CARE_PROVIDER_SITE_OTHER): Payer: Medicaid Other | Admitting: Women's Health

## 2018-07-12 ENCOUNTER — Encounter: Payer: Self-pay | Admitting: Women's Health

## 2018-07-12 VITALS — BP 120/83 | HR 87 | Ht 63.0 in | Wt 157.0 lb

## 2018-07-12 DIAGNOSIS — Z975 Presence of (intrauterine) contraceptive device: Secondary | ICD-10-CM

## 2018-07-12 DIAGNOSIS — Z4889 Encounter for other specified surgical aftercare: Secondary | ICD-10-CM

## 2018-07-12 NOTE — Progress Notes (Signed)
   GYN VISIT Patient name: Alexis Montgomery MRN [id number]  Date of birth: 11-04-1990 Chief Complaint:   Post-op Follow-up (c-section)  History of Present Illness:   Alexis Montgomery is a 28 y.o. [id number] Caucasian female 7d s/p RLTCS @ 39.1wks, being seen today for incision check.  Breast & bottlefeeding. Got Nexplanon in hospital. Cries easily, doesn't feel depressed. No h/o PPD w/ last son. Denies SI/HI/II.   No LMP recorded. Review of Systems:   Pertinent items are noted in HPI Denies fever/chills, dizziness, headaches, visual disturbances, fatigue, shortness of breath, chest pain, abdominal pain, vomiting, abnormal vaginal discharge/itching/odor/irritation, problems with periods, bowel movements, urination, or intercourse unless otherwise stated above.  Pertinent History Reviewed:  Reviewed past medical,surgical, social, obstetrical and family history.  Reviewed problem list, medications and allergies. Physical Assessment:   Vitals:   07/12/18 1130  BP: 120/83  Pulse: 87  Weight: 157 lb (71.2 kg)  Height: 5\' 3"  (1.6 m)  Body mass index is 27.81 kg/m.       Physical Examination:   General appearance: alert, well appearing, and in no distress  Mental status: alert, oriented to person, place, and time  Skin: warm & dry   Cardiovascular: normal heart rate noted  Respiratory: normal respiratory effort, no distress  Abdomen: soft, non-tender, c/s incision healing well  Pelvic: examination not indicated  Extremities: no edema   No results found for this or any previous visit (from the past 24 hour(s)).  Assessment & Plan:  1) 7d s/p RLTCS> incision healing well, breast & bottlefeeding  2) PP Blues> give it another week or so, if persists, let us know  Meds: No orders of the defined types were placed in this encounter.   No orders of the defined types were placed in this encounter.   Return in about 4 weeks (around 08/09/2018) for postpartum visit.  [place], WHNP-BC  07/12/2018 12:00 PM

## 2018-07-20 DIAGNOSIS — E611 Iron deficiency: Secondary | ICD-10-CM | POA: Diagnosis not present

## 2018-07-20 DIAGNOSIS — Z9884 Bariatric surgery status: Secondary | ICD-10-CM | POA: Diagnosis not present

## 2018-07-20 DIAGNOSIS — K912 Postsurgical malabsorption, not elsewhere classified: Secondary | ICD-10-CM | POA: Diagnosis not present

## 2018-07-20 DIAGNOSIS — I10 Essential (primary) hypertension: Secondary | ICD-10-CM | POA: Diagnosis not present

## 2018-08-09 ENCOUNTER — Encounter: Payer: Self-pay | Admitting: Women's Health

## 2018-08-09 ENCOUNTER — Ambulatory Visit (INDEPENDENT_AMBULATORY_CARE_PROVIDER_SITE_OTHER): Payer: Medicaid Other | Admitting: Women's Health

## 2018-08-09 ENCOUNTER — Other Ambulatory Visit: Payer: Self-pay

## 2018-08-09 MED ORDER — SULFAMETHOXAZOLE-TRIMETHOPRIM 800-160 MG PO TABS: 1.0000 | ORAL_TABLET | Freq: Two times a day (BID) | ORAL | 0 refills | Status: DC

## 2018-08-09 NOTE — Progress Notes (Signed)
POSTPARTUM VISIT Patient name: Alexis Montgomery MRN [id number]  Date of birth: 19-Oct-1990 Chief Complaint:   Postpartum Care  History of Present Illness:   JENNIFR Montgomery is a 28 y.o. [phone] Caucasian female being seen today for a postpartum visit. She is 5 weeks postpartum following a repeat cesarean section, low transverse incision at 39.1 gestational weeks. Anesthesia: spinal. Laceration: n/a. I have fully reviewed the prenatal and intrapartum course. Pregnancy uncomplicated. Postpartum course has been complicated by boils, h/o hidradenitis. Bleeding thin lochia. Bowel function is normal. Bladder function is normal.  Patient is not sexually active. Last sexual activity: prior to birth of baby.  Contraception method is Nexplanon inserted in hospital on 07/05/18   Last pap 06/11/15.  Results were normal .  No LMP recorded. Patient has had an implant.  Baby's course has been uncomplicated. Baby is feeding by bottle    Edinburgh Postpartum Depression Screening: negative Edinburgh Postnatal Depression Scale - 08/09/18 1415      Edinburgh Postnatal Depression Scale:  In the Past 7 Days   I have been able to laugh and see the funny side of things.  0    I have looked forward with enjoyment to things.  0    I have blamed myself unnecessarily when things went wrong.  0    I have been anxious or worried for no good reason.  0    I have felt scared or panicky for no good reason.  0    Things have been getting on top of me.  0    I have been so unhappy that I have had difficulty sleeping.  0    I have felt sad or miserable.  0    I have been so unhappy that I have been crying.  0    The thought of harming myself has occurred to me.  0    Edinburgh Postnatal Depression Scale Total  0      Review of Systems:   Pertinent items are noted in HPI Denies Abnormal vaginal discharge w/ itching/odor/irritation, headaches, visual changes, shortness of breath, chest pain, abdominal pain, severe  nausea/vomiting, or problems with urination or bowel movements. Pertinent History Reviewed:  Reviewed past medical,surgical, obstetrical and family history.  Reviewed problem list, medications and allergies. OB History  Gravida Para Term Preterm AB Living  5 2 1 1 3 2   SAB TAB Ectopic Multiple Live Births  2 0 1 0 2    # Outcome Date GA Lbr Len/2nd Weight Sex Delivery Anes PTL Lv  5 Term 07/04/18 [redacted]w[redacted]d   M CS-LTranv  N LIV  4 Ectopic 06/2016          3 SAB 04/2016             Birth Comments: blighted ovum, GS [redacted]w[redacted]d, cytotec  2 Preterm 04/23/12 [redacted]w[redacted]d  6 lb 9.6 oz (2.995 kg) M CS-LTranv EPI  LIV     Complications: Failure to Progress in First Stage, Preeclampsia  1 SAB 2013           Physical Assessment:   Vitals:   08/09/18 1417  BP: 128/84  Pulse: 86  Weight: 158 lb (71.7 kg)  Height: 5\' 3"  (1.6 m)  Body mass index is 27.99 kg/m.       Physical Examination:   General appearance: alert, well appearing, and in no distress  Mental status: alert, oriented to person, place, and time  Skin: warm & dry, boil Lt breast, Rt  mons  Cardiovascular: normal heart rate noted   Respiratory: normal respiratory effort, no distress   Breasts: deferred, no complaints   Abdomen: soft, non-tender, c/s incision healing well   Pelvic: examination not indicated  Rectal: deferred  Extremities: no edema       No results found for this or any previous visit (from the past 24 hour(s)).  Assessment & Plan:  1) Postpartum exam 2) 5 wks s/p RCS 3) Bottlefeeding 4) Depression screening 5) Nexplanon placed in hospital 6) Hidradenitis> rx septra  Meds:  Meds ordered this encounter  Medications  . sulfamethoxazole-trimethoprim (BACTRIM DS) 800-160 MG tablet    Sig: Take 1 tablet by mouth 2 (two) times daily. X 7 days    Dispense:  14 tablet    Refill:  0    Order Specific Question:   Supervising Provider    Answer:   Lazaro ArmsEURE, LUTHER H [[id number]]    Follow-up: Return for 1st available , Pap &  physical.   No orders of the defined types were placed in this encounter.   Cheral MarkerKimberly R Cayce Quezada CNM, [facility]NP[facility]BC 08/09/2018 3:05 PM

## 2018-08-09 NOTE — Patient Instructions (Signed)
Hidradenitis . Loose, light clothing. Avoid heat, friction, shearing. Don't squeeze! . Wash clothes in perfume/dye free detergent . Gentle non-soap cleanser, wash gently w/ fingers (No cloth, loofah), can use antibacterial cleanser . Stop smoking! . Weight loss . Decrease/no dairy  Hidradenitis Suppurativa Hidradenitis suppurativa is a long-term (chronic) skin disease that starts with blocked sweat glands or hair follicles. Bacteria may grow in these blocked openings of your skin. Hidradenitis suppurativa is like a severe form of acne that develops in areas of your body where acne would be unusual. It is most likely to affect the areas of your body where skin rubs against skin and becomes moist. This includes your:  Underarms.  Groin.  Genital areas.  Buttocks.  Upper thighs.  Breasts. Hidradenitis suppurativa may start out with small pimples. The pimples can develop into deep sores that break open (rupture) and drain pus. Over time your skin may thicken and become scarred. Hidradenitis suppurativa cannot be passed from person to person. What are the causes? The exact cause of hidradenitis suppurativa is not known. This condition may be due to:  Female and female hormones. The condition is rare before and after puberty.  An overactive body defense system (immune system). Your immune system may overreact to the blocked hair follicles or sweat glands and cause swelling and pus-filled sores. What increases the risk? You may have a higher risk of hidradenitis suppurativa if you:  Are a woman.  Are between ages 11 and 55.  Have a family history of hidradenitis suppurativa.  Have a personal history of acne.  Are overweight.  Smoke.  Take the drug lithium. What are the signs or symptoms? The first signs of an outbreak are usually painful skin bumps that look like pimples. As the condition progresses:  Skin bumps may get bigger and grow deeper into the skin.  Bumps under the  skin may rupture and drain smelly pus.  Skin may become itchy and infected.  Skin may thicken and scar.  Drainage may continue through tunnels under the skin (fistulas).  Walking and moving your arms can become painful. How is this diagnosed? Your health care provider may diagnose hidradenitis suppurativa based on your medical history and your signs and symptoms. A physical exam will also be done. You may need to see a health care provider who specializes in skin diseases (dermatologist). You may also have tests done to confirm the diagnosis. These can include:  Swabbing a sample of pus or drainage from your skin so it can be sent to the lab and tested for infection.  Blood tests to check for infection. How is this treated? The same treatment will not work for everybody with hidradenitis suppurativa. Your treatment will depend on how severe your symptoms are. You may need to try several treatments to find what works best for you. Part of your treatment may include cleaning and bandaging (dressing) your wounds. You may also have to take medicines, such as the following:  Antibiotics.  Acne medicines.  Medicines to block or suppress the immune system.  A diabetes medicine (metformin) is sometimes used to treat this condition.  For women, birth control pills can sometimes help relieve symptoms. You may need surgery if you have a severe case of hidradenitis suppurativa that does not respond to medicine. Surgery may involve:  Using a laser to clear the skin and remove hair follicles.  Opening and draining deep sores.  Removing the areas of skin that are diseased and scarred. Follow these instructions   at home:  Learn as much as you can about your disease, and work closely with your health care providers.  Take medicines only as directed by your health care provider.  If you were prescribed an antibiotic medicine, finish it all even if you start to feel better.  If you are  overweight, losing weight may be very helpful. Try to reach and maintain a healthy weight.  Do not use any tobacco products, including cigarettes, chewing tobacco, or electronic cigarettes. If you need help quitting, ask your health care provider.  Do not shave the areas where you get hidradenitis suppurativa.  Do not wear deodorant.  Wear loose-fitting clothes.  Try not to overheat and get sweaty.  Take a daily bleach bath as directed by your health care provider.  Fill your bathtub halfway with water.  Pour in  cup of unscented household bleach.  Soak for 5-10 minutes.  Cover sore areas with a warm, clean washcloth (compress) for 5-10 minutes. Contact a health care provider if:  You have a flare-up of hidradenitis suppurativa.  You have chills or a fever.  You are having trouble controlling your symptoms at home. This information is not intended to replace advice given to you by your health care provider. Make sure you discuss any questions you have with your health care provider. Document Released: 08/07/2003 Document Revised: 05/31/2015 Document Reviewed: 03/25/2013 Elsevier Interactive Patient Education  2017 Elsevier Inc[employer]    

## 2018-08-10 ENCOUNTER — Other Ambulatory Visit: Payer: Self-pay | Admitting: Women's Health

## 2018-08-10 MED ORDER — CLINDAMYCIN HCL 300 MG PO CAPS: 300.0000 mg | ORAL_CAPSULE | Freq: Two times a day (BID) | ORAL | 0 refills | Status: DC

## 2018-08-25 ENCOUNTER — Other Ambulatory Visit: Payer: BC Managed Care – PPO | Admitting: Women's Health

## 2018-09-21 ENCOUNTER — Other Ambulatory Visit: Payer: Self-pay | Admitting: Women's Health

## 2018-09-21 MED ORDER — MEGESTROL ACETATE 40 MG PO TABS: ORAL_TABLET | ORAL | 1 refills | Status: DC

## 2018-09-24 ENCOUNTER — Other Ambulatory Visit: Payer: BC Managed Care – PPO | Admitting: Women's Health

## 2018-09-28 ENCOUNTER — Other Ambulatory Visit: Payer: BC Managed Care – PPO | Admitting: Women's Health

## 2018-10-12 ENCOUNTER — Other Ambulatory Visit: Payer: Medicaid Other | Admitting: Women's Health

## 2018-11-02 ENCOUNTER — Other Ambulatory Visit: Payer: Self-pay | Admitting: *Deleted

## 2018-11-02 DIAGNOSIS — Z20822 Contact with and (suspected) exposure to covid-19: Secondary | ICD-10-CM

## 2018-11-04 LAB — NOVEL CORONAVIRUS, NAA: SARS-CoV-2, NAA: DETECTED — AB

## 2018-12-31 ENCOUNTER — Emergency Department (HOSPITAL_COMMUNITY): Payer: Medicaid Other

## 2018-12-31 ENCOUNTER — Other Ambulatory Visit: Payer: Self-pay

## 2018-12-31 ENCOUNTER — Encounter (HOSPITAL_COMMUNITY): Payer: Self-pay | Admitting: Emergency Medicine

## 2018-12-31 ENCOUNTER — Emergency Department (HOSPITAL_COMMUNITY)
Admission: EM | Admit: 2018-12-31 | Discharge: 2018-12-31 | Disposition: A | Discharge status: Home / Self Care | Payer: Medicaid Other | Attending: Emergency Medicine | Admitting: Emergency Medicine

## 2018-12-31 DIAGNOSIS — Y9389 Activity, other specified: Secondary | ICD-10-CM | POA: Insufficient documentation

## 2018-12-31 DIAGNOSIS — R0789 Other chest pain: Secondary | ICD-10-CM | POA: Insufficient documentation

## 2018-12-31 DIAGNOSIS — S6990XA Unspecified injury of unspecified wrist, hand and finger(s), initial encounter: Secondary | ICD-10-CM | POA: Diagnosis not present

## 2018-12-31 DIAGNOSIS — Y929 Unspecified place or not applicable: Secondary | ICD-10-CM | POA: Diagnosis not present

## 2018-12-31 DIAGNOSIS — Y998 Other external cause status: Secondary | ICD-10-CM | POA: Diagnosis not present

## 2018-12-31 DIAGNOSIS — W460XXA Contact with hypodermic needle, initial encounter: Secondary | ICD-10-CM | POA: Insufficient documentation

## 2018-12-31 MED ORDER — LIDOCAINE HCL (PF) 1 % IJ SOLN
INTRAMUSCULAR | Status: AC
  Filled 2018-12-31: qty 30

## 2018-12-31 MED ORDER — TETANUS-DIPHTH-ACELL PERTUSSIS 5-2.5-18.5 LF-MCG/0.5 IM SUSP
0.5000 mL | Freq: Once | INTRAMUSCULAR | Status: AC
  Administered 2018-12-31: 0.5 mL via INTRAMUSCULAR
  Filled 2018-12-31: qty 0.5

## 2018-12-31 NOTE — ED Triage Notes (Signed)
Patient was trying to give her cousin an epi shot, missed and hit her own thumb (R) and self injected. Had CP in field, none at present. Patient denies nausea or dizziness but reports "some chest tightness." Patient has epi pen embedded in her thumb, was unable to remove it.

## 2018-12-31 NOTE — Discharge Instructions (Signed)
Follow-up with Dr. Aline Brochure if any problems with the thumb

## 2018-12-31 NOTE — ED Notes (Signed)
Pt updated on plan of care, denies any needs at present time, Dr zammit in prior to RN, see edp assessment for further,

## 2018-12-31 NOTE — ED Provider Notes (Signed)
[facility]Y [facility]PART[facility] Provider Note   CSN: [id number] Arrival date & time: 12/31/18  1920     History Chief Complaint  Patient presents with  . Chest Pain    Alexis Montgomery is a 28 y.o. female.  Patient states that she was trying to help her friend take her EpiPen and she accidentally shot herself in the thumb.  And she is unable to get the EpiPen out of her thumb.  She then started having some chest discomfort but by the time she was evaluated she no longer had the pain.  The history is provided by the patient. No language interpreter was used.  Chest Pain Pain location:  L chest Pain quality: aching   Pain radiates to:  Does not radiate Pain severity:  Mild Timing:  Rare Progression:  Resolved Chronicity:  New Context: not breathing   Relieved by:  Nothing Associated symptoms: no abdominal pain, no back pain, no cough, no fatigue and no headache        Past Medical History:  Diagnosis Date  . Anemia   . Benign essential HTN 10/09/2014  . Hidradenitis suppurativa 10/09/2014  . Menorrhagia with irregular cycle 06/11/2015  . Morbid obesity (HCC) 10/09/2014  . MRSA infection 10/09/2014  . Pregnancy induced hypertension   . Tachycardia     Patient Active Problem List   Diagnosis Date Noted  . History of C-section 07/04/2018  . Palpitations 06/21/2018  . Low vitamin D level 12/17/2017  . 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 Kidney stone 07/21/2017  . Postoperative intestinal malabsorption 07/13/2017  . Iron deficiency 05/08/2017  . H/O severe pre-e 03/03/2016  . Previous cesarean section 03/03/2016  . History of preterm delivery 03/03/2016  . Nexplanon in place 06/11/2015  . Chronic back pain 05/18/2015  . Hidradenitis suppurativa 10/09/2014  . History of chronic hypertension 10/09/2014    Past Surgical History:  Procedure Laterality Date  . CESAREAN SECTION N/A 04/23/2012   Procedure: CESAREAN SECTION;  Surgeon: Tereso NewcomerUgonna A Anyanwu, MD;   Location: [facility] ORS;  Service: Obstetrics;  Laterality: N/A;  . CESAREAN SECTION N/A 07/04/2018   Procedure: CESAREAN SECTION;  Surgeon: Conan Bowensavis, Kelly M, MD;  Location: [facility] ORS;  Service: Obstetrics;  Laterality: N/A;  . DILATION AND CURETTAGE OF UTERUS N/A 07/07/2016   Procedure: SUCTION DILATATION AND CURETTAGE;  Surgeon: Tilda BurrowFerguson, John V, MD;  Location: [facility];  Service: Gynecology;  Laterality: N/A;  . LAPAROSCOPIC ROUX-EN-Y GASTRIC BYPASS WITH UPPER ENDOSCOPY AND REMOVAL OF LAP BAND  06/08/2017     OB History    Gravida  5   Para  2   Term  1   Preterm  1   AB  3   Living  2     SAB  2   TAB  0   Ectopic  1   Multiple  0   Live Births  2           Family History  Problem Relation Age of Onset  . HIV Mother   . Cancer Mother   . Heart Problems Paternal Grandfather   . Diabetes Paternal Uncle     Social History   Tobacco Use  . Smoking status: Never Smoker  . Smokeless tobacco: Never Used  Substance Use Topics  . Alcohol use: No    Alcohol/week: 0.0 standard drinks  . Drug use: No    Home Medications Prior to Admission medications   Medication Sig Start Date End Date  Taking? Authorizing Provider  clindamycin (CLEOCIN) 300 MG capsule Take 1 capsule (300 mg total) by mouth 2 (two) times daily. X 7 days 08/10/18   Roma Schanz, CNM  megestrol (MEGACE) 40 MG tablet 3x5d, 2x5d, then 1 daily to help control vaginal bleeding. Stop taking when bleeding stops. 09/21/18   Roma Schanz, CNM  pantoprazole (PROTONIX) 40 MG tablet Take 1 tablet (40 mg total) by mouth daily. 01/26/18   Florian Buff, MD  Prenatal Vit-Fe Fumarate-FA (PRENATAL VITAMIN PO) Take 1 tablet by mouth daily.     [provider]  sulfamethoxazole-trimethoprim (BACTRIM DS) 800-160 MG tablet Take 1 tablet by mouth 2 (two) times daily. X 7 days 08/09/18   Roma Schanz, CNM    Allergies    Sulfa antibiotics  Review of Systems   Review of Systems  Constitutional: Negative  for appetite change and fatigue.  HENT: Negative for congestion, ear discharge and sinus pressure.   Eyes: Negative for discharge.  Respiratory: Negative for cough.   Cardiovascular: Positive for chest pain.  Gastrointestinal: Negative for abdominal pain and diarrhea.  Genitourinary: Negative for frequency and hematuria.  Musculoskeletal: Negative for back pain.       Left thumb pain  Skin: Negative for rash.  Neurological: Negative for seizures and headaches.  Psychiatric/Behavioral: Negative for hallucinations.    Physical Exam Updated Vital Signs BP 121/88 (BP Location: Left Arm)   Pulse (!) 104   Temp 99.2 F (37.3 C) (Oral)   Resp 15   Ht 5\' 3"  (1.6 m)   Wt 73.9 kg   SpO2 98%   BMI 28.87 kg/m   Physical Exam Vitals and nursing note reviewed.  Constitutional:      Appearance: She is well-developed.  HENT:     Head: Normocephalic.     Nose: Nose normal.  Eyes:     General: No scleral icterus.    Conjunctiva/sclera: Conjunctivae normal.  Neck:     Thyroid: No thyromegaly.  Cardiovascular:     Rate and Rhythm: Normal rate and regular rhythm.     Heart sounds: No murmur. No friction rub. No gallop.   Pulmonary:     Breath sounds: No stridor. No wheezing or rales.  Chest:     Chest wall: No tenderness.  Abdominal:     General: There is no distension.     Tenderness: There is no abdominal tenderness. There is no rebound.  Musculoskeletal:        General: Normal range of motion.     Cervical back: Neck supple.     Comments: Patient has an EpiPen stuck in the volar surface of the thumb distally.  Thumb still has full range of motion and good cap refill  Lymphadenopathy:     Cervical: No cervical adenopathy.  Skin:    Findings: No erythema or rash.  Neurological:     Mental Status: She is oriented to person, place, and time.     Motor: No abnormal muscle tone.     Coordination: Coordination normal.  Psychiatric:        Behavior: Behavior normal.     ED  Results / Procedures / Treatments   Labs (all labs ordered are listed, but only abnormal results are displayed) Labs Reviewed - No data to display  EKG EKG Interpretation  Date/Time:  Friday December 31 2018 19:38:01 EST Ventricular Rate:  103 PR Interval:  162 QRS Duration: 90 QT Interval:  350 QTC Calculation: 458 R Axis:  19 Text Interpretation: Sinus tachycardia Minimal voltage criteria for LVH, may be normal variant Possible Lateral infarct , age undetermined Abnormal ECG Confirmed by Bethann Berkshire [phone]) on 12/31/2018 10:33:21 PM   Radiology DG Finger Thumb Right  Result Date: 12/31/2018 CLINICAL DATA:  Post removal of EpiPen question of foreign body EXAM: RIGHT THUMB 2+V COMPARISON:  None. FINDINGS: There is no evidence of fracture or dislocation. There is no evidence of arthropathy or other focal bone abnormality. Soft tissue swelling seen around the distal phalanx. IMPRESSION: No radiopaque foreign body is seen. Electronically Signed   By: Jonna Clark M.D.   On: 12/31/2018 21:33   DG Finger Thumb Right  Result Date: 12/31/2018 CLINICAL DATA:  Accidentally injected herself with an EpiPen in her RIGHT thumb, still lodged at distal phalanx EXAM: RIGHT THUMB 2+V COMPARISON:  None FINDINGS: Large foreign body at distal phalanx and IP joint corresponding to appy pen. Osseous mineralization normal. Joint spaces preserved. No acute fracture or dislocation. The tip of the needle projects within the volar aspect of the distal phalanx of the RIGHT thumb. IMPRESSION: Tip of the needle of the EpiPen projects within the volar aspect of the distal phalanx of the RIGHT thumb. No other focal osseous abnormalities. Electronically Signed   By: Ulyses Southward M.D.   On: 12/31/2018 20:14    Procedures .Nerve Block  Date/Time: 12/31/2018 10:39 PM Performed by: Bethann Berkshire, MD Authorized by: Bethann Berkshire, MD   Consent:    Consent obtained:  Verbal Comments:     Patient had a digital  block done to her right thumb.  Right thumb was cleaned with alcohol and 2% lidocaine was injected bilaterally at the base of her thumb.  Approximately 5 cc of 2% lidocaine without epi.  The patient tolerated procedure well and there was good anesthesia  .Ortho Injury Treatment  Date/Time: 12/31/2018 10:41 PM Performed by: Bethann Berkshire, MD Authorized by: Bethann Berkshire, MD  Comments: Patient had EpiPen stuck in her right thumb.  After the thumb was anesthetized the EpiPen was removed without problems.  The patient tolerated this procedure well     (including critical care time)  Medications Ordered in ED Medications  Tdap (BOOSTRIX) injection 0.5 mL (has no administration in time range)  lidocaine (PF) (XYLOCAINE) 1 % injection (  Given by Other 12/31/18 2150)    ED Course  I have reviewed the triage vital signs and the nursing notes.  Pertinent labs & imaging results that were available during my care of the patient were reviewed by me and considered in my medical decision making (see chart for details).    MDM Rules/Calculators/A&P                      Patient with chest pain that is noncardiac probably related to anxiety.  Patient also had the injection in her right thumb with the EpiPen still in it.  This is been removed and she has tolerated everything well I will go home Final Clinical Impression(s) / ED Diagnoses Final diagnoses:  Atypical chest pain  Thumb injury, unspecified laterality, initial encounter    Rx / DC Orders ED Discharge Orders    None       Bethann Berkshire, MD 12/31/18 2242

## 2019-01-13 ENCOUNTER — Telehealth: Payer: BC Managed Care – PPO | Admitting: Cardiovascular Disease

## 2019-01-18 ENCOUNTER — Telehealth: Payer: Self-pay | Admitting: Cardiovascular Disease

## 2019-01-18 NOTE — Telephone Encounter (Signed)
FYI.  °Contacted patient regarding recall appointment, patient notified our office they did not wish to keep this appointment at this time.  Deleted recall from system. °

## 2019-02-09 DIAGNOSIS — F988 Other specified behavioral and emotional disorders with onset usually occurring in childhood and adolescence: Secondary | ICD-10-CM | POA: Diagnosis not present

## 2019-02-09 DIAGNOSIS — Z0000 Encounter for general adult medical examination without abnormal findings: Secondary | ICD-10-CM | POA: Diagnosis not present

## 2019-02-09 DIAGNOSIS — Z6828 Body mass index (BMI) 28.0-28.9, adult: Secondary | ICD-10-CM | POA: Diagnosis not present

## 2019-04-26 ENCOUNTER — Other Ambulatory Visit: Payer: Self-pay | Admitting: Women's Health

## 2019-05-10 ENCOUNTER — Ambulatory Visit: Payer: BC Managed Care – PPO | Admitting: Sports Medicine

## 2019-05-10 ENCOUNTER — Other Ambulatory Visit: Payer: Self-pay

## 2019-05-10 ENCOUNTER — Ambulatory Visit (INDEPENDENT_AMBULATORY_CARE_PROVIDER_SITE_OTHER): Payer: BC Managed Care – PPO

## 2019-05-10 ENCOUNTER — Encounter: Payer: Self-pay | Admitting: Sports Medicine

## 2019-05-10 ENCOUNTER — Other Ambulatory Visit: Payer: Self-pay | Admitting: Sports Medicine

## 2019-05-10 VITALS — BP 129/84 | HR 87 | Temp 97.4°F | Resp 16

## 2019-05-10 DIAGNOSIS — M79672 Pain in left foot: Secondary | ICD-10-CM

## 2019-05-10 DIAGNOSIS — M2011 Hallux valgus (acquired), right foot: Secondary | ICD-10-CM

## 2019-05-10 DIAGNOSIS — M21619 Bunion of unspecified foot: Secondary | ICD-10-CM | POA: Diagnosis not present

## 2019-05-10 DIAGNOSIS — M2012 Hallux valgus (acquired), left foot: Secondary | ICD-10-CM | POA: Diagnosis not present

## 2019-05-10 DIAGNOSIS — M79671 Pain in right foot: Secondary | ICD-10-CM | POA: Diagnosis not present

## 2019-05-10 DIAGNOSIS — M216X1 Other acquired deformities of right foot: Secondary | ICD-10-CM

## 2019-05-10 NOTE — Progress Notes (Signed)
Subjective: Alexis Montgomery is a 29 y.o. female patient who presents to office for evaluation of Left>right bunion pain. Patient complains of progressive pain especially over the last year in the Left>Right foot that starts as pain over the bump with direct pressure and range of motion; patient now has difficulty fitting shoes comfortably. Ranks pain 8/10 and is now interferring with daily activities and worsening since having baby 10 months ago.  Patient has also tried changing shoes and tylenol. Reports bunion issues for as long as she can remember. Patient denies any other pedal complaints.   Hx of gastric bypass surgery CAN NOT Take NSAIDS.  Patient Active Problem List   Diagnosis Date Noted  . History of C-section 07/04/2018  . Palpitations 06/21/2018  . Low vitamin D level 12/17/2017  . 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 Kidney stone 07/21/2017  . Postoperative intestinal malabsorption 07/13/2017  . Iron deficiency 05/08/2017  . H/O severe pre-e 03/03/2016  . Previous cesarean section 03/03/2016  . History of preterm delivery 03/03/2016  . Nexplanon in place 06/11/2015  . Chronic back pain 05/18/2015  . Hidradenitis suppurativa 10/09/2014  . History of chronic hypertension 10/09/2014    Current Outpatient Medications on File Prior to Visit  Medication Sig Dispense Refill  . ADDERALL XR 10 MG 24 hr capsule     . citalopram (CELEXA) 20 MG tablet Take 20 mg by mouth daily.    . megestrol (MEGACE) 40 MG tablet 3x5d, 2x5d, then 1 daily to help control vaginal bleeding. Stop taking when bleeding stops. 45 tablet 1  . omeprazole (PRILOSEC) 20 MG capsule TAKE ONE CAPSULE (20MG TOTAL) BY MOUTH DAILY 30 capsule 6   No current facility-administered medications on file prior to visit.    Allergies  Allergen Reactions  . Sulfa Antibiotics Hives    Objective:  General: Alert and oriented x3 in no acute distress  Dermatology: No open lesions bilateral lower extremities,  no webspace macerations, no ecchymosis bilateral, all nails x 10 are well manicured.  Vascular: Dorsalis Pedis and Posterior Tibial pedal pulses 2/4, Capillary Fill Time 3 seconds, (+) pedal hair growth bilateral, no edema bilateral lower extremities, Temperature gradient within normal limits.  Neurology: Gross sensation intact via light touch bilateral.  Musculoskeletal: Mild to moderate tenderness with palpation left>right bunion deformity, no limitation or crepitus with range of motion, deformity reducible, tracking not trackbound, there is mild 1st ray hypermobility noted bilateral. Midtarsal, Subtalar joint, and ankle joint range of motion is within normal limits. On weightbearing exam, there is decreased 1st MTPJ rom Left>right with functional limitus noted, there is medial arch collapse bilateral on weightbearing, rearfoot slight valgus, forefoot slight abduction with HAV deformity supported on ground with no second toe crossover deformity noted.    Xrays  Right/Left Foot    Impression: Intermetatarsal angle above normal limits consistent with bunion bilateral with erosions at met head L>R with hallux deviation      Assessment and Plan: Problem List Items Addressed This Visit    None    Visit Diagnoses    Pain in both feet    -  Primary   Relevant Orders   DG Foot Complete Left (Completed)   Bunion           -Complete examination performed -Xrays reviewed -Discussed treatement options; discussed HAV deformity;conservative and  Surgical management; risks, benefits, alternatives discussed. All patient's questions answered. -Dispensed bunion shields -Recommend continue with good supportive shoes and inserts.  -Patient to return  to office in 3-4 weeks for surgery consult or sooner if condition worsens.  Landis Martins, DPM

## 2019-05-10 NOTE — Patient Instructions (Signed)
Bunion  A bunion is a bump on the base of the big toe that forms when the bones of the big toe joint move out of position. Bunions may be small at first, but they often get larger over time. They can make walking painful. What are the causes? A bunion may be caused by:  Wearing narrow or pointed shoes that force the big toe to press against the other toes.  Abnormal foot development that causes the foot to roll inward (pronate).  Changes in the foot that are caused by certain diseases, such as rheumatoid arthritis or polio.  A foot injury. What increases the risk? The following factors may make you more likely to develop this condition:  Wearing shoes that squeeze the toes together.  Having certain diseases, such as: ? Rheumatoid arthritis. ? Polio. ? Cerebral palsy.  Having family members who have bunions.  Being born with a foot deformity, such as flat feet or low arches.  Doing activities that put a lot of pressure on the feet, such as ballet dancing. What are the signs or symptoms? The main symptom of a bunion is a noticeable bump on the big toe. Other symptoms may include:  Pain.  Swelling around the big toe.  Redness and inflammation.  Thick or hardened skin on the big toe or between the toes.  Stiffness or loss of motion in the big toe.  Trouble with walking. How is this diagnosed? A bunion may be diagnosed based on your symptoms, medical history, and activities. You may have tests, such as:  X-rays. These allow your health care provider to check the position of the bones in your foot and look for damage to your joint. They also help your health care provider determine the severity of your bunion and the best way to treat it.  Joint aspiration. In this test, a sample of fluid is removed from the toe joint. This test may be done if you are in a lot of pain. It helps rule out diseases that cause painful swelling of the joints, such as arthritis. How is this  treated? Treatment depends on the severity of your symptoms. The goal of treatment is to relieve symptoms and prevent the bunion from getting worse. Your health care provider may recommend:  Wearing shoes that have a wide toe box.  Using bunion pads to cushion the affected area.  Taping your toes together to keep them in a normal position.  Placing a device inside your shoe (orthotics) to help reduce pressure on your toe joint.  Taking medicine to ease pain, inflammation, and swelling.  Applying heat or ice to the affected area.  Doing stretching exercises.  Surgery to remove scar tissue and move the toes back into their normal position. This treatment is rare. Follow these instructions at home: Managing pain, stiffness, and swelling   If directed, put ice on the painful area: ? Put ice in a plastic bag. ? Place a towel between your skin and the bag. ? Leave the ice on for 20 minutes, 2-3 times a day. Activity   If directed, apply heat to the affected area before you exercise. Use the heat source that your health care provider recommends, such as a moist heat pack or a heating pad. ? Place a towel between your skin and the heat source. ? Leave the heat on for 20-30 minutes. ? Remove the heat if your skin turns bright red. This is especially important if you are unable to feel pain,   heat, or cold. You may have a greater risk of getting burned.  Do exercises as told by your health care provider. General instructions  Support your toe joint with proper footwear, shoe padding, or taping as told by your health care provider.  Take over-the-counter and prescription medicines only as told by your health care provider.  Keep all follow-up visits as told by your health care provider. This is important. Contact a health care provider if your symptoms:  Get worse.  Do not improve in 2 weeks. Get help right away if you have:  Severe pain and trouble with walking. Summary  A  bunion is a bump on the base of the big toe that forms when the bones of the big toe joint move out of position.  Bunions can make walking painful.  Treatment depends on the severity of your symptoms.  Support your toe joint with proper footwear, shoe padding, or taping as told by your health care provider. This information is not intended to replace advice given to you by your health care provider. Make sure you discuss any questions you have with your health care provider. Document Revised: 06/29/2017 Document Reviewed: 05/05/2017 Elsevier Patient Education  2020 [employer]  

## 2019-05-12 DIAGNOSIS — K8020 Calculus of gallbladder without cholecystitis without obstruction: Secondary | ICD-10-CM | POA: Insufficient documentation

## 2019-05-12 DIAGNOSIS — K838 Other specified diseases of biliary tract: Secondary | ICD-10-CM | POA: Insufficient documentation

## 2019-05-22 ENCOUNTER — Ambulatory Visit
Admission: EM | Admit: 2019-05-22 | Discharge: 2019-05-22 | Disposition: A | Discharge status: Home / Self Care | Payer: Medicaid Other | Attending: Emergency Medicine | Admitting: Emergency Medicine

## 2019-05-22 ENCOUNTER — Other Ambulatory Visit: Payer: Self-pay

## 2019-05-22 DIAGNOSIS — Z20822 Contact with and (suspected) exposure to covid-19: Secondary | ICD-10-CM | POA: Insufficient documentation

## 2019-05-22 DIAGNOSIS — J069 Acute upper respiratory infection, unspecified: Secondary | ICD-10-CM | POA: Diagnosis present

## 2019-05-22 LAB — POCT RAPID STREP A (OFFICE): Rapid Strep A Screen: NEGATIVE

## 2019-05-22 MED ORDER — CETIRIZINE HCL 10 MG PO TABS
10.0000 mg | ORAL_TABLET | Freq: Every day | ORAL | 0 refills | Status: DC
Start: 2019-05-22 — End: 2019-09-21

## 2019-05-22 MED ORDER — FLUTICASONE PROPIONATE 50 MCG/ACT NA SUSP
2.0000 | Freq: Every day | NASAL | 0 refills | Status: DC
Start: 2019-05-22 — End: 2019-05-31

## 2019-05-22 MED ORDER — BENZONATATE 100 MG PO CAPS
100.0000 mg | ORAL_CAPSULE | Freq: Three times a day (TID) | ORAL | 0 refills | Status: DC
Start: 2019-05-22 — End: 2019-05-31

## 2019-05-22 NOTE — Discharge Instructions (Signed)
Strep negative. Culture sent.  We will follow up with you regarding abnormal results COVID testing ordered.  It will take between 2-5 days for test results.  Someone will contact you regarding abnormal results.    In the meantime: You should remain isolated in your home for 10 days from symptom onset AND greater than 72 hours after symptoms resolution (absence of fever without the use of fever-reducing medication and improvement in respiratory symptoms), whichever is longer Get plenty of rest and push fluids Tessalon Perles prescribed for cough Use OTC zyrtec for nasal congestion, runny nose, and/or sore throat Use OTC flonase for nasal congestion and runny nose Use medications daily for symptom relief Use OTC medications like ibuprofen or tylenol as needed fever or pain Call or go to the ED if you have any new or worsening symptoms such as fever, worsening cough, shortness of breath, chest tightness, chest pain, turning blue, changes in mental status, etc..Marland Kitchen

## 2019-05-22 NOTE — ED Provider Notes (Signed)
[place]   [id number] 05/22/19 Arrival Time: 2376   CC: COVID symptoms  SUBJECTIVE: History from: patient.  Alexis Montgomery is a 29 y.o. female who presents with drainage, cough, and sore throat x 1.5 weeks.  Denies sick exposure to COVID, flu or strep.  Tested positive for COVID infection in the past. Has tried theraflu without relief.  Symptoms are made worse at night.  Denies previous symptoms in the past.   Denies fever, chills, fatigue, SOB, wheezing, chest pain, nausea, changes in bowel or bladder habits.     ROS: As per HPI.  All other pertinent ROS negative.     Past Medical History:  Diagnosis Date  . Anemia   . Benign essential HTN 10/09/2014  . Hidradenitis suppurativa 10/09/2014  . Menorrhagia with irregular cycle 06/11/2015  . Morbid obesity ([place]) 10/09/2014  . MRSA infection 10/09/2014  . Pregnancy induced hypertension   . Tachycardia    Past Surgical History:  Procedure Laterality Date  . CESAREAN SECTION N/A 04/23/2012   Procedure: CESAREAN SECTION;  Surgeon: Osborne Oman, MD;  Location: [facility];  Service: Obstetrics;  Laterality: N/A;  . CESAREAN SECTION N/A 07/04/2018   Procedure: CESAREAN SECTION;  Surgeon: Sloan Leiter, MD;  Location: [facility];  Service: Obstetrics;  Laterality: N/A;  . DILATION AND CURETTAGE OF UTERUS N/A 07/07/2016   Procedure: SUCTION DILATATION AND CURETTAGE;  Surgeon: Jonnie Kind, MD;  Location: [facility];  Service: Gynecology;  Laterality: N/A;  . LAPAROSCOPIC ROUX-EN-Y GASTRIC BYPASS WITH UPPER ENDOSCOPY AND REMOVAL OF LAP BAND  06/08/2017   Allergies  Allergen Reactions  . Sulfa Antibiotics Hives   No current facility-administered medications on file prior to encounter.   Current Outpatient Medications on File Prior to Encounter  Medication Sig Dispense Refill  . ADDERALL XR 10 MG 24 hr capsule     . citalopram (CELEXA) 20 MG tablet Take 20 mg by mouth daily.    . megestrol (MEGACE) 40 MG tablet 3x5d, 2x5d, then 1  daily to help control vaginal bleeding. Stop taking when bleeding stops. 45 tablet 1  . omeprazole (PRILOSEC) 20 MG capsule TAKE ONE CAPSULE (20MG  TOTAL) BY MOUTH DAILY 30 capsule 6   Social History   Socioeconomic History  . Marital status: Legally Separated    Spouse name: JUSTIN   . Number of children: 2  . Years of education: Not on file  . Highest education level: High school graduate  Occupational History  . Not on file  Tobacco Use  . Smoking status: Never Smoker  . Smokeless tobacco: Never Used  Substance and Sexual Activity  . Alcohol use: No    Alcohol/week: 0.0 standard drinks  . Drug use: No  . Sexual activity: Not Currently    Birth control/protection: None  Other Topics Concern  . Not on file  Social History Narrative  . Not on file   Social Determinants of Health   Financial Resource Strain:   . Difficulty of Paying Living Expenses:   Food Insecurity:   . Worried About Charity fundraiser in the Last Year:   . Arboriculturist in the Last Year:   Transportation Needs:   . Film/video editor (Medical):   Marland Kitchen Lack of Transportation (Non-Medical):   Physical Activity:   . Days of Exercise per Week:   . Minutes of Exercise per Session:   Stress:   . Feeling of Stress :   Social Connections:   .  Frequency of Communication with Friends and Family:   . Frequency of Social Gatherings with Friends and Family:   . Attends Religious Services:   . Active Member of Clubs or Organizations:   . Attends Banker Meetings:   Marland Kitchen Marital Status:   Intimate Partner Violence:   . Fear of Current or Ex-Partner:   . Emotionally Abused:   Marland Kitchen Physically Abused:   . Sexually Abused:    Family History  Problem Relation Age of Onset  . HIV Mother   . Cancer Mother   . Heart Problems Paternal Grandfather   . Diabetes Paternal Uncle     OBJECTIVE:  There were no vitals filed for this visit.   General appearance: alert; appears fatigued, but nontoxic;  speaking in full sentences and tolerating own secretions HEENT: NCAT; Ears: EACs clear, TMs pearly gray; Eyes: PERRL.  EOM grossly intact. Nose: nares patent without rhinorrhea, Throat: oropharynx clear, tonsils non erythematous or enlarged, uvula midline  Neck: supple without LAD Lungs: unlabored respirations, symmetrical air entry; cough: absent; no respiratory distress; CTAB Heart: regular rate and rhythm.   Skin: warm and dry Psychological: alert and cooperative; normal mood and affect  LABS:  Results for orders placed or performed during the hospital encounter of 05/22/19 (from the past 24 hour(s))  POCT rapid strep A     Status: None   Collection Time: 05/22/19 12:40 PM  Result Value Ref Range   Rapid Strep A Screen Negative Negative     ASSESSMENT & PLAN:  1. Suspected COVID-19 virus infection   2. Viral URI with cough     Meds ordered this encounter  Medications  . cetirizine (ZYRTEC) 10 MG tablet    Sig: Take 1 tablet (10 mg total) by mouth daily.    Dispense:  30 tablet    Refill:  0    Order Specific Question:   Supervising Provider    Answer:   Eustace Moore [[id number]]  . fluticasone (FLONASE) 50 MCG/ACT nasal spray    Sig: Place 2 sprays into both nostrils daily.    Dispense:  16 g    Refill:  0    Order Specific Question:   Supervising Provider    Answer:   Eustace Moore [[id number]]  . benzonatate (TESSALON) 100 MG capsule    Sig: Take 1 capsule (100 mg total) by mouth every 8 (eight) hours.    Dispense:  21 capsule    Refill:  0    Order Specific Question:   Supervising Provider    Answer:   Eustace Moore [[id number]]   Strep negative. Culture sent.  We will follow up with you regarding abnormal results COVID testing ordered.  It will take between 2-5 days for test results.  Someone will contact you regarding abnormal results.    In the meantime: You should remain isolated in your home for 10 days from symptom onset AND greater than 72 hours  after symptoms resolution (absence of fever without the use of fever-reducing medication and improvement in respiratory symptoms), whichever is longer Get plenty of rest and push fluids Tessalon Perles prescribed for cough Use OTC zyrtec for nasal congestion, runny nose, and/or sore throat Use OTC flonase for nasal congestion and runny nose Use medications daily for symptom relief Use OTC medications like ibuprofen or tylenol as needed fever or pain Call or go to the ED if you have any new or worsening symptoms such as fever, worsening cough, shortness of breath,  chest tightness, chest pain, turning blue, changes in mental status, etc...   Reviewed expectations re: course of current medical issues. Questions answered. Outlined signs and symptoms indicating need for more acute intervention. Patient verbalized understanding. After Visit Summary given.         Rennis Harding, PA-C 05/22/19 1451

## 2019-05-22 NOTE — ED Triage Notes (Signed)
Pt presents with c/o cough and hoarseness for past week

## 2019-05-23 LAB — SARS-COV-2, NAA 2 DAY TAT

## 2019-05-23 LAB — NOVEL CORONAVIRUS, NAA: SARS-CoV-2, NAA: NOT DETECTED

## 2019-05-25 LAB — CULTURE, GROUP A STREP (THRC)

## 2019-05-31 ENCOUNTER — Encounter: Payer: Self-pay | Admitting: Sports Medicine

## 2019-05-31 ENCOUNTER — Ambulatory Visit (INDEPENDENT_AMBULATORY_CARE_PROVIDER_SITE_OTHER): Payer: Medicaid Other | Admitting: Sports Medicine

## 2019-05-31 ENCOUNTER — Other Ambulatory Visit: Payer: Self-pay

## 2019-05-31 VITALS — Temp 97.8°F

## 2019-05-31 DIAGNOSIS — M216X2 Other acquired deformities of left foot: Secondary | ICD-10-CM

## 2019-05-31 DIAGNOSIS — M79672 Pain in left foot: Secondary | ICD-10-CM

## 2019-05-31 DIAGNOSIS — M21619 Bunion of unspecified foot: Secondary | ICD-10-CM

## 2019-05-31 NOTE — Progress Notes (Signed)
Subjective: Alexis Montgomery is a 29 y.o. female patient who returns to office for follow up evaluation of bunion pain Left>right foot. Patient reports that she has tried pads and wants to discuss surgery. Patient denies any changes with medical history since last visit, history of gastric bypass surgery CAN NOT Take NSAIDS. No new issues noted.  Patient Active Problem List   Diagnosis Date Noted  . Dilated cbd, acquired 05/12/2019  . Symptomatic cholelithiasis 05/12/2019  . History of C-section 07/04/2018  . Palpitations 06/21/2018  . Low vitamin D level 12/17/2017  . 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 Kidney stone 07/21/2017  . Postoperative intestinal malabsorption 07/13/2017  . Iron deficiency 05/08/2017  . H/O severe pre-e 03/03/2016  . Previous cesarean section 03/03/2016  . History of preterm delivery 03/03/2016  . Nexplanon in place 06/11/2015  . Chronic back pain 05/18/2015  . Hidradenitis suppurativa 10/09/2014  . History of chronic hypertension 10/09/2014    Current Outpatient Medications on File Prior to Visit  Medication Sig Dispense Refill  . cetirizine (ZYRTEC) 10 MG tablet Take 1 tablet (10 mg total) by mouth daily. 30 tablet 0  . megestrol (MEGACE) 40 MG tablet 3x5d, 2x5d, then 1 daily to help control vaginal bleeding. Stop taking when bleeding stops. 45 tablet 1  . omeprazole (PRILOSEC) 20 MG capsule TAKE ONE CAPSULE (20MG  TOTAL) BY MOUTH DAILY 30 capsule 6   No current facility-administered medications on file prior to visit.    Allergies  Allergen Reactions  . Sulfa Antibiotics Hives   Past Surgical History:  Procedure Laterality Date  . CESAREAN SECTION N/A 04/23/2012   Procedure: CESAREAN SECTION;  Surgeon: Osborne Oman, MD;  Location: [facility];  Service: Obstetrics;  Laterality: N/A;  . CESAREAN SECTION N/A 07/04/2018   Procedure: CESAREAN SECTION;  Surgeon: Sloan Leiter, MD;  Location: [facility];  Service: Obstetrics;  Laterality: N/A;   . DILATION AND CURETTAGE OF UTERUS N/A 07/07/2016   Procedure: SUCTION DILATATION AND CURETTAGE;  Surgeon: Jonnie Kind, MD;  Location: [facility];  Service: Gynecology;  Laterality: N/A;  . LAPAROSCOPIC ROUX-EN-Y GASTRIC BYPASS WITH UPPER ENDOSCOPY AND REMOVAL OF LAP BAND  06/08/2017    Social History   Socioeconomic History  . Marital status: Legally Separated    Spouse name: JUSTIN   . Number of children: 2  . Years of education: Not on file  . Highest education level: High school graduate  Occupational History  . Not on file  Tobacco Use  . Smoking status: Never Smoker  . Smokeless tobacco: Never Used  Substance and Sexual Activity  . Alcohol use: No    Alcohol/week: 0.0 standard drinks  . Drug use: No  . Sexual activity: Not Currently    Birth control/protection: None  Other Topics Concern  . Not on file  Social History Narrative  . Not on file   Social Determinants of Health   Financial Resource Strain:   . Difficulty of Paying Living Expenses:   Food Insecurity:   . Worried About Charity fundraiser in the Last Year:   . Arboriculturist in the Last Year:   Transportation Needs:   . Film/video editor (Medical):   Marland Kitchen Lack of Transportation (Non-Medical):   Physical Activity:   . Days of Exercise per Week:   . Minutes of Exercise per Session:   Stress:   . Feeling of Stress :   Social Connections:   . Frequency of  Communication with Friends and Family:   . Frequency of Social Gatherings with Friends and Family:   . Attends Religious Services:   . Active Member of Clubs or Organizations:   . Attends Banker Meetings:   Marland Kitchen Marital Status:     Family History  Problem Relation Age of Onset  . HIV Mother   . Cancer Mother   . Heart Problems Paternal Grandfather   . Diabetes Paternal Uncle      Objective:  General: Alert and oriented x3 in no acute distress  Dermatology: No open lesions bilateral lower extremities, no webspace  macerations, no ecchymosis bilateral, all nails x 10 are well manicured.  Vascular: Dorsalis Pedis and Posterior Tibial pedal pulses 2/4, Capillary Fill Time 3 seconds, (+) pedal hair growth bilateral, no edema bilateral lower extremities, Temperature gradient within normal limits.  Neurology: Gross sensation intact via light touch bilateral.  Musculoskeletal: Mild to moderate tenderness with palpation left>right bunion deformity, no limitation or crepitus with range of motion, deformity reducible, tracking not trackbound, there is mild 1st ray hypermobility noted bilateral. No second toe crossover.       Assessment and Plan: Problem List Items Addressed This Visit    None    Visit Diagnoses    Bunion    -  Primary   Left foot pain           -Complete examination performed -Previous Xrays reviewed -Discussed treatement options; discussed HAV deformity;conservative and  Surgical management; risks, benefits, alternatives discussed. All patient's questions answered. -Patient opt for surgical management. Consent obtained for Left bunionectomy with possible 1st toe osteotomy. Pre and Post op course explained. Risks, benefits, alternatives explained. No guarantees given or implied. Surgical booking slip submitted and provided patient with Surgical packet and info for [facility]. -To dispense CAM boot and crutches at surgical center; advised patient that her insurance will not cover scooter may be better off buying it.  -Patient to return to office after surgery or sooner if condition worsens.  Asencion Islam, DPM

## 2019-05-31 NOTE — Patient Instructions (Signed)
Pre-Operative Instructions  Congratulations, you have decided to take an important step towards improving your quality of life.  You can be assured that the doctors and staff at [facility] will be with you every step of the way.  Here are some important things you should know:  1. Plan to be at the surgery center/hospital at least 1 (one) hour prior to your scheduled time, unless otherwise directed by the surgical center/hospital staff.  You must have a responsible adult accompany you, remain during the surgery and drive you home.  Make sure you have directions to the surgical center/hospital to ensure you arrive on time. 2. If you are having surgery at [facility] or [facility], you will need a copy of your medical history and physical form from your family physician within one month prior to the date of surgery. We will give you a form for your primary physician to complete.  3. We make every effort to accommodate the date you request for surgery.  However, there are times where surgery dates or times have to be moved.  We will contact you as soon as possible if a change in schedule is required.   4. No aspirin/ibuprofen for one week before surgery.  If you are on aspirin, any non-steroidal anti-inflammatory medications (Mobic, Aleve, Ibuprofen) should not be taken seven (7) days prior to your surgery.  You make take Tylenol for pain prior to surgery.  5. Medications - If you are taking daily heart and blood pressure medications, seizure, reflux, allergy, asthma, anxiety, pain or diabetes medications, make sure you notify the surgery center/hospital before the day of surgery so they can tell you which medications you should take or avoid the day of surgery. 6. No food or drink after midnight the night before surgery unless directed otherwise by surgical center/hospital staff. 7. No alcoholic beverages 24-hours prior to surgery.  No smoking 24-hours prior or 24-hours after  surgery. 8. Wear loose pants or shorts. They should be loose enough to fit over bandages, boots, and casts. 9. Don't wear slip-on shoes. Sneakers are preferred. 10. Bring your boot with you to the surgery center/hospital.  Also bring crutches or a walker if your physician has prescribed it for you.  If you do not have this equipment, it will be provided for you after surgery. 11. If you have not been contacted by the surgery center/hospital by the day before your surgery, call to confirm the date and time of your surgery. 12. Leave-time from work may vary depending on the type of surgery you have.  Appropriate arrangements should be made prior to surgery with your employer. 13. Prescriptions will be provided immediately following surgery by your doctor.  Fill these as soon as possible after surgery and take the medication as directed. Pain medications will not be refilled on weekends and must be approved by the doctor. 14. Remove nail polish on the operative foot and avoid getting pedicures prior to surgery. 15. Wash the night before surgery.  The night before surgery wash the foot and leg well with water and the antibacterial soap provided. Be sure to pay special attention to beneath the toenails and in between the toes.  Wash for at least three (3) minutes. Rinse thoroughly with water and dry well with a towel.  Perform this wash unless told not to do so by your physician.  Enclosed: 1 Ice pack (please put in freezer the night before surgery)   1 Hibiclens skin cleaner     Pre-op instructions  If you have any questions regarding the instructions, please do not hesitate to call our office.  [place]: [address], [place], [place] [address] -- [phone]  [place]: [address] [place], [place] [address] -- [phone]  [place]: [address], [place], [place] [address] -- [phone]   Website: [email or web address] 

## 2019-06-14 ENCOUNTER — Ambulatory Visit: Payer: Medicaid Other | Admitting: Sports Medicine

## 2019-06-19 ENCOUNTER — Other Ambulatory Visit: Payer: Self-pay | Admitting: Sports Medicine

## 2019-06-19 DIAGNOSIS — Z9889 Other specified postprocedural states: Secondary | ICD-10-CM

## 2019-06-19 NOTE — Progress Notes (Signed)
Post op meds entered  -Dr. Tyffani Foglesong 

## 2019-06-20 ENCOUNTER — Encounter: Payer: Self-pay | Admitting: Sports Medicine

## 2019-06-20 DIAGNOSIS — M2012 Hallux valgus (acquired), left foot: Secondary | ICD-10-CM | POA: Diagnosis not present

## 2019-06-20 MED ORDER — HYDROMORPHONE HCL 4 MG PO TABS: 4.0000 mg | ORAL_TABLET | Freq: Four times a day (QID) | ORAL | 0 refills | Status: AC | PRN

## 2019-06-20 MED ORDER — ASPIRIN EC 325 MG PO TBEC: 325.0000 mg | DELAYED_RELEASE_TABLET | Freq: Every day | ORAL | 0 refills | Status: DC

## 2019-06-20 MED ORDER — ACETAMINOPHEN 500 MG PO TABS: 500.0000 mg | ORAL_TABLET | Freq: Four times a day (QID) | ORAL | 0 refills | Status: DC | PRN

## 2019-06-20 MED ORDER — PROMETHAZINE HCL 12.5 MG PO TABS: 12.5000 mg | ORAL_TABLET | Freq: Three times a day (TID) | ORAL | 0 refills | Status: DC | PRN

## 2019-06-20 MED ORDER — DOCUSATE SODIUM 100 MG PO CAPS: 100.0000 mg | ORAL_CAPSULE | Freq: Two times a day (BID) | ORAL | 0 refills | Status: DC

## 2019-06-21 ENCOUNTER — Telehealth: Payer: Self-pay | Admitting: Sports Medicine

## 2019-06-21 ENCOUNTER — Other Ambulatory Visit: Payer: Self-pay

## 2019-06-21 ENCOUNTER — Telehealth: Payer: Self-pay | Admitting: Podiatry

## 2019-06-21 ENCOUNTER — Encounter: Payer: Self-pay | Admitting: Sports Medicine

## 2019-06-21 ENCOUNTER — Ambulatory Visit (INDEPENDENT_AMBULATORY_CARE_PROVIDER_SITE_OTHER): Payer: Medicaid Other | Admitting: Sports Medicine

## 2019-06-21 VITALS — BP 116/70 | HR 83 | Temp 98.9°F | Resp 16

## 2019-06-21 DIAGNOSIS — Z9889 Other specified postprocedural states: Secondary | ICD-10-CM

## 2019-06-21 MED ORDER — OXYCODONE-ACETAMINOPHEN 10-325 MG PO TABS: 1.0000 | ORAL_TABLET | Freq: Four times a day (QID) | ORAL | 0 refills | Status: DC | PRN

## 2019-06-21 NOTE — Telephone Encounter (Signed)
Patient contacted [place] through our website and stated the following, "Hi I just had surgery yesterday 6/14 Im wondering do I ave to keep the boot on all the time?"

## 2019-06-21 NOTE — Telephone Encounter (Signed)
This patient called in the morning stating that that her foot was painful since 330 this morning when the anesthesia wore off.  She called and was basically crying due to her pain.  I had difficulty conversing with this patient since she frequently talked to members of her family I assume as well as to me simultaneously.  She says that she was only able to take Tylenol due to having stomach surgery previously.  Therefore she was not dispensed any narcotics.  I then asked her to remove her foot from the cam walker.  I asked her to tell me the color of her big toe which she said was normal pedal pink color then I told her to remove the Ace bandage to help to relieve pressure at the surgical site.  Upon removing the bandage she noted that there was significant bleeding noted on the bandage at the surgical site.  Upon learning this I told this patient to call the office early this morning and make a appointment for a bandage redressing with Dr. Marylene Land.  Marland Kitchen

## 2019-06-21 NOTE — Telephone Encounter (Signed)
Left message informing she should be in the boot at all times, even to sleep, but if resting could open the boot, the boot was for protection and positioning of the surgery foot.

## 2019-06-21 NOTE — Progress Notes (Signed)
Brief podiatry note  Patient seen status post bunionectomy for dressing change.  Patient reports that she is feeling a little nauseous so was placed in Trendelenburg position with her symptoms improving.  Upon removal of the dressing there was very minimal bleeding noted on the inner gauze layer with mild forefoot swelling.  Dry dressing was reapplied and encourage patient to continue with pain management, change Dilaudid to Percocet to see if this will give her better relief and recommend rest and ice and elevation and continue with nonweightbearing with use of knee scooter.  Patient to follow-up as scheduled for continued postoperative care and postoperative x-rays as scheduled on next week.

## 2019-06-21 NOTE — Telephone Encounter (Signed)
Post op check phone call made to patient. Patient reports that her block wore off at 3am and had terrible pain, and called on call doctor who advised her to remove the boot and to lossen the ace wrap of which she did and reports that she noticed a lot of blood on the guaze wrap. I advised patient to come to office today for a dressing change, continue with pain medication, rest, ice, elevation and NWB with crutches. Patient will come to office this morning for assessment. -Dr. Marylene Land

## 2019-06-22 ENCOUNTER — Encounter: Payer: Self-pay | Admitting: Podiatry

## 2019-06-22 ENCOUNTER — Telehealth: Payer: Self-pay | Admitting: Sports Medicine

## 2019-06-22 ENCOUNTER — Ambulatory Visit (INDEPENDENT_AMBULATORY_CARE_PROVIDER_SITE_OTHER): Payer: Medicaid Other | Admitting: Podiatry

## 2019-06-22 VITALS — BP 130/78 | Temp 99.8°F

## 2019-06-22 DIAGNOSIS — M21619 Bunion of unspecified foot: Secondary | ICD-10-CM

## 2019-06-22 NOTE — Telephone Encounter (Signed)
Alexis Montgomery called and told me that she has come in to office. Change dressing, I did put betadine and steristrips on the area so it could be medication vs clear fluid from swelling -Dr. Kathie Rhodes

## 2019-06-23 NOTE — Progress Notes (Signed)
Subjective:   Patient ID: Alexis Montgomery, female   DOB: 29 y.o.   MRN: [id number]   HPI Patient presents stating she was concerned because she saw blood on her bandage and she wanted to make sure it was okay.  States that she is in no acute systemic stress   ROS      Objective:  Physical Exam  Neurovascular status was found to be intact negative Denna Haggard' sign was noted.  Patient had the dressing removed and left the Xeroform on and I inspected this visually and I did not note any current active drainage there was no erythema there was no odor or proximal systemic signs of infective process.  It appears to be strictly some localized bleeding secondary to the procedure performed 2 days ago by Dr. Marylene Land.     Assessment:  What appears to be normal postoperative bleeding secondary to extensive forward foot reconstruction left     Plan:  H&P educated her on this and my findings.  At this point I applied a good compression dressing which she stated felt good and that she feels comfortable now with how it is doing.  I gave her strict instructions on monitoring her temperature and if it should go up at all to inform us immediately and I gave her instructions on continued nonweightbearing and elevation and proximal inspection.  If any changes were to occur she is to let us know immediately and patient will continue complete immobilization of the site.  Encouraged to call and is to be seen back for routine visit with Dr. Marylene Land next week if no issues were to arise

## 2019-06-27 ENCOUNTER — Telehealth: Payer: Self-pay | Admitting: Sports Medicine

## 2019-06-27 NOTE — Telephone Encounter (Signed)
Left message for pt to call with more information on the dressing.

## 2019-06-27 NOTE — Telephone Encounter (Signed)
PT contacted through our website and stated the following, "Hey my cut is still leaking and it seems like a lot to me should I be concerned? Can I change the bandage they have already changed it 2 times since my surgery Monday."

## 2019-06-28 ENCOUNTER — Encounter: Payer: Self-pay | Admitting: Sports Medicine

## 2019-06-28 ENCOUNTER — Other Ambulatory Visit: Payer: Self-pay

## 2019-06-28 ENCOUNTER — Ambulatory Visit (INDEPENDENT_AMBULATORY_CARE_PROVIDER_SITE_OTHER): Payer: Medicaid Other

## 2019-06-28 ENCOUNTER — Ambulatory Visit (INDEPENDENT_AMBULATORY_CARE_PROVIDER_SITE_OTHER): Payer: Medicaid Other | Admitting: Sports Medicine

## 2019-06-28 VITALS — Temp 97.0°F

## 2019-06-28 DIAGNOSIS — Z9889 Other specified postprocedural states: Secondary | ICD-10-CM

## 2019-06-28 DIAGNOSIS — M79672 Pain in left foot: Secondary | ICD-10-CM

## 2019-06-28 DIAGNOSIS — M216X2 Other acquired deformities of left foot: Secondary | ICD-10-CM

## 2019-06-28 DIAGNOSIS — M21619 Bunion of unspecified foot: Secondary | ICD-10-CM

## 2019-06-28 MED ORDER — OXYCODONE-ACETAMINOPHEN 10-325 MG PO TABS: 1.0000 | ORAL_TABLET | Freq: Four times a day (QID) | ORAL | 0 refills | Status: AC | PRN

## 2019-06-28 NOTE — Progress Notes (Signed)
Subjective: Alexis Montgomery is a 29 y.o. female patient seen today in office for POV #1  (DOS 06-20-19), S/P Left lapidus. Patient denies pain at surgical site, much better than last week now 0/10 percocet helped much better, denies calf pain, denies headache, chest pain, shortness of breath, nausea, vomiting, fever, or chills. Patient states that she had a boil that came up and her PCP started her on antibiotics for this. No other issues noted.   Patient Active Problem List   Diagnosis Date Noted  . Dilated cbd, acquired 05/12/2019  . Symptomatic cholelithiasis 05/12/2019  . History of C-section 07/04/2018  . Palpitations 06/21/2018  . Low vitamin D level 12/17/2017  . 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 Kidney stone 07/21/2017  . Postoperative intestinal malabsorption 07/13/2017  . Iron deficiency 05/08/2017  . H/O severe pre-e 03/03/2016  . Previous cesarean section 03/03/2016  . History of preterm delivery 03/03/2016  . Nexplanon in place 06/11/2015  . Chronic back pain 05/18/2015  . Hidradenitis suppurativa 10/09/2014  . History of chronic hypertension 10/09/2014    Current Outpatient Medications on File Prior to Visit  Medication Sig Dispense Refill  . acetaminophen (TYLENOL) 500 MG tablet Take 1 tablet (500 mg total) by mouth every 6 (six) hours as needed for mild pain or fever (or in between doses of pain medication for additional pain relief). 30 tablet 0  . aspirin EC 325 MG tablet Take 1 tablet (325 mg total) by mouth daily. 30 tablet 0  . cetirizine (ZYRTEC) 10 MG tablet Take 1 tablet (10 mg total) by mouth daily. 30 tablet 0  . docusate sodium (COLACE) 100 MG capsule Take 1 capsule (100 mg total) by mouth 2 (two) times daily. 10 capsule 0  . megestrol (MEGACE) 40 MG tablet 3x5d, 2x5d, then 1 daily to help control vaginal bleeding. Stop taking when bleeding stops. 45 tablet 1  . omeprazole (PRILOSEC) 20 MG capsule TAKE ONE CAPSULE (20MG  TOTAL) BY MOUTH DAILY 30  capsule 6  . promethazine (PHENERGAN) 12.5 MG tablet Take 1 tablet (12.5 mg total) by mouth every 8 (eight) hours as needed for nausea or vomiting. 20 tablet 0   No current facility-administered medications on file prior to visit.    Allergies  Allergen Reactions  . Sulfa Antibiotics Hives    Objective: There were no vitals filed for this visit.  General: No acute distress, AAOx3  Left foot: Sutures intact with no gapping or dehiscence at surgical site, mild swelling to left foot, no erythema, no warmth, no drainage, no signs of infection noted, Capillary fill time <3 seconds in all digits, gross sensation present via light touch to left foot. No pain or crepitation with range of motion left foot mild guarding with range of motion of 1st MTPJ and 1st ray, will re-assessment at later visit.  No pain with calf compression.   Assessment and Plan:  Problem List Items Addressed This Visit    None    Visit Diagnoses    Bunion    -  Primary   Relevant Orders   DG Foot Complete Left (Completed)   S/P foot surgery, left       Left foot pain           -Patient seen and evaluated -Xrays reviewed consistent with post op status, hardware intact at 1st metcunieform joint -Applied dry sterile dressing to surgical site left foot secured with ACE wrap and stockinet  -Advised patient to make sure to keep  dressings clean, dry, and intact to left surgical site -Advised patient to continue with NWB with crutches and scooter -Advised patient to limit activity to necessity  -Advised patient to ice and elevate as necessary  -Refilled pain medication -Will plan for possible suture removal at next office visit. In the meantime, patient to call office if any issues or problems arise.   Asencion Islam, DPM

## 2019-07-05 ENCOUNTER — Ambulatory Visit (INDEPENDENT_AMBULATORY_CARE_PROVIDER_SITE_OTHER): Payer: Medicaid Other | Admitting: Sports Medicine

## 2019-07-05 ENCOUNTER — Other Ambulatory Visit: Payer: Self-pay

## 2019-07-05 ENCOUNTER — Encounter: Payer: Self-pay | Admitting: Sports Medicine

## 2019-07-05 VITALS — Temp 98.0°F

## 2019-07-05 DIAGNOSIS — Z9889 Other specified postprocedural states: Secondary | ICD-10-CM

## 2019-07-05 DIAGNOSIS — M79672 Pain in left foot: Secondary | ICD-10-CM

## 2019-07-05 DIAGNOSIS — M21619 Bunion of unspecified foot: Secondary | ICD-10-CM

## 2019-07-05 NOTE — Progress Notes (Signed)
Subjective: YURY SCHAUS is a 29 y.o. female patient seen today in office for POV # 2 (DOS 06-20-19), S/P Left lapidus. Patient admits some throbbing pain at night and that is relieved with Tylenol, denies calf pain, denies headache, chest pain, shortness of breath, nausea, vomiting, fever, or chills. Patient states that she is getting tired of sitting and reports that it is hard on her since she has a 1-year-old and a 55-year-old trying to remain nonweightbearing but is trying to do the best that she can and has been using her scooter in the home. No other issues noted.   Patient Active Problem List   Diagnosis Date Noted  . Dilated cbd, acquired 05/12/2019  . Symptomatic cholelithiasis 05/12/2019  . History of C-section 07/04/2018  . Palpitations 06/21/2018  . Low vitamin D level 12/17/2017  . 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 Kidney stone 07/21/2017  . Postoperative intestinal malabsorption 07/13/2017  . Iron deficiency 05/08/2017  . H/O severe pre-e 03/03/2016  . Previous cesarean section 03/03/2016  . History of preterm delivery 03/03/2016  . Nexplanon in place 06/11/2015  . Chronic back pain 05/18/2015  . Hidradenitis suppurativa 10/09/2014  . History of chronic hypertension 10/09/2014    Current Outpatient Medications on File Prior to Visit  Medication Sig Dispense Refill  . acetaminophen (TYLENOL) 500 MG tablet Take 1 tablet (500 mg total) by mouth every 6 (six) hours as needed for mild pain or fever (or in between doses of pain medication for additional pain relief). 30 tablet 0  . aspirin EC 325 MG tablet Take 1 tablet (325 mg total) by mouth daily. 30 tablet 0  . cetirizine (ZYRTEC) 10 MG tablet Take 1 tablet (10 mg total) by mouth daily. 30 tablet 0  . docusate sodium (COLACE) 100 MG capsule Take 1 capsule (100 mg total) by mouth 2 (two) times daily. 10 capsule 0  . megestrol (MEGACE) 40 MG tablet 3x5d, 2x5d, then 1 daily to help control vaginal bleeding. Stop  taking when bleeding stops. 45 tablet 1  . omeprazole (PRILOSEC) 20 MG capsule TAKE ONE CAPSULE (20MG  TOTAL) BY MOUTH DAILY 30 capsule 6  . promethazine (PHENERGAN) 12.5 MG tablet Take 1 tablet (12.5 mg total) by mouth every 8 (eight) hours as needed for nausea or vomiting. 20 tablet 0   No current facility-administered medications on file prior to visit.    Allergies  Allergen Reactions  . Sulfa Antibiotics Hives    Objective: There were no vitals filed for this visit.  General: No acute distress, AAOx3  Left foot: Sutures intact with no gapping or dehiscence at surgical site, mild swelling to left foot, no erythema, no warmth, no drainage, no signs of infection noted, Capillary fill time <3 seconds in all digits, gross sensation present via light touch to left foot. No pain or crepitation with range of motion left foot mild guarding with range of motion of 1st MTPJ and 1st ray acceptable for postoperative status, no pain with calf compression.   Assessment and Plan:  Problem List Items Addressed This Visit    None    Visit Diagnoses    Bunion    -  Primary   S/P foot surgery, left       Left foot pain           -Patient seen and evaluated -Incision site check performed at this time decided to leave sutures in a little bit longer due to swelling  -Applied Steri-Strips and dry  sterile dressing to surgical site left foot secured with ACE wrap and stockinet  -Advised patient to make sure to keep dressings clean, dry, and intact to left surgical site until next office visit -Advised patient to continue with NWB with crutches and scooter -Advised patient to limit activity to necessity  -Advised patient to ice and elevate as necessary  -Continue with Tylenol as needed for mild pain and Percocet as needed for severe pain -Will plan for suture removal at next office visit and patient to remain nonweightbearing until her follow-up. In the meantime, patient to call office if any issues  or problems arise.   Asencion Islam, DPM

## 2019-07-14 ENCOUNTER — Ambulatory Visit (INDEPENDENT_AMBULATORY_CARE_PROVIDER_SITE_OTHER): Payer: Medicaid Other | Admitting: Podiatry

## 2019-07-14 ENCOUNTER — Other Ambulatory Visit: Payer: Self-pay

## 2019-07-14 ENCOUNTER — Encounter: Payer: Self-pay | Admitting: Podiatry

## 2019-07-14 DIAGNOSIS — Z9889 Other specified postprocedural states: Secondary | ICD-10-CM

## 2019-07-14 NOTE — Progress Notes (Signed)
  Subjective:  Patient ID: Alexis Montgomery, female    DOB: May 05, 1990,  MRN: [id number]  Chief Complaint  Patient presents with  . Routine Post Op    Suture removal POV #3 DOS 06/20/19 BUNIONECTOMY W/INTERNAL FIXATION & POSS 1ST TOE OSTEOTOMY (BONE BUT), LEFT FOOT    29 y.o. female presents with the above complaint. History confirmed with patient.  No pain.  Minimal swelling.  Feeling well.  Objective:  Physical Exam: warm, good capillary refill, no trophic changes or ulcerative lesions, normal DP and PT pulses and normal sensory exam. Left Foot: Incision well coapted, sutures and Steri-Strips intact.  Removed all sutures today with fresh Steri-Strips applied.  No ecchymosis, minimal edema, no signs of infection  Assessment:   1. Status post bunionectomy   2. Postoperative state      Plan:  Patient was evaluated and treated and all questions answered.  -She is doing well now 3 and half weeks status post left foot Lapidus bunionectomy.  Remaining sutures removed today.  Fresh Steri-Strips were applied today.  I advised her she may begin bathing, avoid soaking, she may put a light layer of lotion on the foot -Continue nonweightbearing in CAM boot boot with crutches -She has appointment scheduled next week with Dr. Marylene Land -New x-rays at next visit   Return in about 5 days (around 07/19/2019) for with Dr Marylene Land.

## 2019-07-14 NOTE — Patient Instructions (Signed)
You may begin bathing and applying gentle lotion to the foot. Do not soak the foot. Continue to put no weight on the foot with your crutches.

## 2019-07-19 ENCOUNTER — Ambulatory Visit (INDEPENDENT_AMBULATORY_CARE_PROVIDER_SITE_OTHER): Payer: Medicaid Other | Admitting: Sports Medicine

## 2019-07-19 ENCOUNTER — Other Ambulatory Visit: Payer: Self-pay

## 2019-07-19 ENCOUNTER — Ambulatory Visit (INDEPENDENT_AMBULATORY_CARE_PROVIDER_SITE_OTHER): Payer: Medicaid Other

## 2019-07-19 ENCOUNTER — Encounter: Payer: Self-pay | Admitting: Sports Medicine

## 2019-07-19 DIAGNOSIS — M21612 Bunion of left foot: Secondary | ICD-10-CM

## 2019-07-19 DIAGNOSIS — M21619 Bunion of unspecified foot: Secondary | ICD-10-CM

## 2019-07-19 DIAGNOSIS — Z9889 Other specified postprocedural states: Secondary | ICD-10-CM

## 2019-07-19 NOTE — Progress Notes (Signed)
Subjective: Alexis Montgomery is a 29 y.o. female patient seen today in office for POV # 3 (DOS 06-20-19), S/P Left lapidus. Patient denies any current pain or symptoms reports that she feels like she is doing really well has issues with swelling and does not like the compression sleeve but otherwise is doing very good.  No other issues noted.   Patient Active Problem List   Diagnosis Date Noted  . Dilated cbd, acquired 05/12/2019  . Symptomatic cholelithiasis 05/12/2019  . History of C-section 07/04/2018  . Palpitations 06/21/2018  . Low vitamin D level 12/17/2017  . 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 Kidney stone 07/21/2017  . Postoperative intestinal malabsorption 07/13/2017  . Iron deficiency 05/08/2017  . H/O severe pre-e 03/03/2016  . Previous cesarean section 03/03/2016  . History of preterm delivery 03/03/2016  . Nexplanon in place 06/11/2015  . Chronic back pain 05/18/2015  . Hidradenitis suppurativa 10/09/2014  . History of chronic hypertension 10/09/2014    Current Outpatient Medications on File Prior to Visit  Medication Sig Dispense Refill  . acetaminophen (TYLENOL) 500 MG tablet Take 1 tablet (500 mg total) by mouth every 6 (six) hours as needed for mild pain or fever (or in between doses of pain medication for additional pain relief). 30 tablet 0  . aspirin EC 325 MG tablet Take 1 tablet (325 mg total) by mouth daily. 30 tablet 0  . cetirizine (ZYRTEC) 10 MG tablet Take 1 tablet (10 mg total) by mouth daily. 30 tablet 0  . docusate sodium (COLACE) 100 MG capsule Take 1 capsule (100 mg total) by mouth 2 (two) times daily. 10 capsule 0  . megestrol (MEGACE) 40 MG tablet 3x5d, 2x5d, then 1 daily to help control vaginal bleeding. Stop taking when bleeding stops. 45 tablet 1  . omeprazole (PRILOSEC) 20 MG capsule TAKE ONE CAPSULE (20MG  TOTAL) BY MOUTH DAILY 30 capsule 6  . promethazine (PHENERGAN) 12.5 MG tablet Take 1 tablet (12.5 mg total) by mouth every 8 (eight)  hours as needed for nausea or vomiting. 20 tablet 0   No current facility-administered medications on file prior to visit.    Allergies  Allergen Reactions  . Sulfa Antibiotics Hives    Objective: There were no vitals filed for this visit.  General: No acute distress, AAOx3  Left foot: Surgical incision healing well with mild dry scabbing, minimal edema no erythema, no warmth, no drainage, no signs of infection noted, Capillary fill time <3 seconds in all digits, gross sensation present via light touch to left foot. No pain or crepitation with range of motion left foot with 60 degrees of dorsiflexion and 10 degrees of plantarflexion at the first MPJ on the left, no pain with calf compression.  X-rays consistent with postoperative status hardware intact at first metatarsal cuneiform joint  Assessment and Plan:  Problem List Items Addressed This Visit    None    Visit Diagnoses    Bunion    -  Primary   Relevant Orders   DG Foot Complete Left (Completed)   Status post bunionectomy       S/P foot surgery, left           -Patient seen and evaluated -X-rays reviewed -Encouraged patient to use scar cream or gel incision site -Patient may now weight-bear with cam boot to her tolerance -Advised patient to ice and elevate as necessary  -Continue with Tylenol as needed for mild pain and Percocet as needed for severe pain -  Will plan for repeat x-ray and transition to postoperative shoe at next visit if she is doing well in 2 to 3 weeks.  Asencion Islam, DPM

## 2019-08-02 ENCOUNTER — Ambulatory Visit (INDEPENDENT_AMBULATORY_CARE_PROVIDER_SITE_OTHER): Payer: Medicaid Other

## 2019-08-02 ENCOUNTER — Ambulatory Visit (INDEPENDENT_AMBULATORY_CARE_PROVIDER_SITE_OTHER): Payer: Medicaid Other | Admitting: Sports Medicine

## 2019-08-02 ENCOUNTER — Encounter: Payer: Self-pay | Admitting: Sports Medicine

## 2019-08-02 ENCOUNTER — Other Ambulatory Visit: Payer: Self-pay

## 2019-08-02 DIAGNOSIS — M21612 Bunion of left foot: Secondary | ICD-10-CM | POA: Diagnosis not present

## 2019-08-02 DIAGNOSIS — Z9889 Other specified postprocedural states: Secondary | ICD-10-CM

## 2019-08-02 DIAGNOSIS — M21619 Bunion of unspecified foot: Secondary | ICD-10-CM

## 2019-08-02 DIAGNOSIS — M79672 Pain in left foot: Secondary | ICD-10-CM

## 2019-08-02 NOTE — Progress Notes (Signed)
Subjective: Alexis Montgomery is a 29 y.o. female patient seen today in office for POV # 4 (DOS 06-20-19), S/P Left lapidus. Patient denies any current pain or symptoms reports that she feels like she is doing good with no pain or discomfort has not been taking any pain medicines and is ready to transition to a surgical shoe reports that she has been wearing a clean sock over the surgical area and resting at the end of the day.  No other issues noted.   Patient Active Problem List   Diagnosis Date Noted  . Dilated cbd, acquired 05/12/2019  . Symptomatic cholelithiasis 05/12/2019  . History of C-section 07/04/2018  . Palpitations 06/21/2018  . Low vitamin D level 12/17/2017  . 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 Kidney stone 07/21/2017  . Postoperative intestinal malabsorption 07/13/2017  . Iron deficiency 05/08/2017  . H/O severe pre-e 03/03/2016  . Previous cesarean section 03/03/2016  . History of preterm delivery 03/03/2016  . Nexplanon in place 06/11/2015  . Chronic back pain 05/18/2015  . Hidradenitis suppurativa 10/09/2014  . History of chronic hypertension 10/09/2014    Current Outpatient Medications on File Prior to Visit  Medication Sig Dispense Refill  . acetaminophen (TYLENOL) 500 MG tablet Take 1 tablet (500 mg total) by mouth every 6 (six) hours as needed for mild pain or fever (or in between doses of pain medication for additional pain relief). 30 tablet 0  . aspirin EC 325 MG tablet Take 1 tablet (325 mg total) by mouth daily. 30 tablet 0  . cetirizine (ZYRTEC) 10 MG tablet Take 1 tablet (10 mg total) by mouth daily. 30 tablet 0  . docusate sodium (COLACE) 100 MG capsule Take 1 capsule (100 mg total) by mouth 2 (two) times daily. 10 capsule 0  . megestrol (MEGACE) 40 MG tablet 3x5d, 2x5d, then 1 daily to help control vaginal bleeding. Stop taking when bleeding stops. 45 tablet 1  . omeprazole (PRILOSEC) 20 MG capsule TAKE ONE CAPSULE (20MG  TOTAL) BY MOUTH DAILY  30 capsule 6  . promethazine (PHENERGAN) 12.5 MG tablet Take 1 tablet (12.5 mg total) by mouth every 8 (eight) hours as needed for nausea or vomiting. 20 tablet 0   No current facility-administered medications on file prior to visit.    Allergies  Allergen Reactions  . Sulfa Antibiotics Hives    Objective: There were no vitals filed for this visit.  General: No acute distress, AAOx3  Left foot: Surgical site well-healed, minimal edema no erythema, no warmth, no drainage, no signs of infection noted, Capillary fill time <3 seconds in all digits, gross sensation present via light touch to left foot. No pain or crepitation with range of motion left foot with 65 degrees of dorsiflexion and 10 degrees of plantarflexion at the first MPJ on the left, no pain with calf compression.  X-rays consistent with postoperative status hardware intact at first metatarsal cuneiform joint with no gapping loosening or dehiscence  Assessment and Plan:  Problem List Items Addressed This Visit    None    Visit Diagnoses    Bunion    -  Primary   Relevant Orders   DG Foot Complete Left   Status post bunionectomy       S/P foot surgery, left       Postoperative state       Left foot pain           -Patient seen and evaluated -Handicap placard offered since patient  will be starting school August 16 -X-rays reviewed -Continue with scar treatment along the incision site -Dispensed surgical shoe for patient to use as instructed for the next few weeks and then after may slowly transition to a tennis shoe as long as pain and swelling is under control -May use toe separator as needed -Continue with Tylenol as needed for mild pain and Percocet as needed for severe pain like previous -Will plan for postoperative check and x-ray at next visit if she is doing well in 2 to 3 weeks.  Asencion Islam, DPM

## 2019-08-04 DIAGNOSIS — Z9119 Patient's noncompliance with other medical treatment and regimen: Secondary | ICD-10-CM | POA: Insufficient documentation

## 2019-08-04 DIAGNOSIS — F909 Attention-deficit hyperactivity disorder, unspecified type: Secondary | ICD-10-CM | POA: Insufficient documentation

## 2019-08-04 DIAGNOSIS — Z91199 Patient's noncompliance with other medical treatment and regimen due to unspecified reason: Secondary | ICD-10-CM | POA: Insufficient documentation

## 2019-09-01 ENCOUNTER — Encounter: Payer: Medicaid Other | Admitting: Sports Medicine

## 2019-09-07 DIAGNOSIS — D500 Iron deficiency anemia secondary to blood loss (chronic): Secondary | ICD-10-CM | POA: Insufficient documentation

## 2019-09-08 ENCOUNTER — Encounter: Payer: Medicaid Other | Admitting: Sports Medicine

## 2019-09-21 ENCOUNTER — Encounter: Payer: Self-pay | Admitting: Advanced Practice Midwife

## 2019-09-21 ENCOUNTER — Ambulatory Visit (INDEPENDENT_AMBULATORY_CARE_PROVIDER_SITE_OTHER): Payer: Medicaid Other | Admitting: Advanced Practice Midwife

## 2019-09-21 VITALS — BP 125/84 | HR 80 | Ht 62.0 in | Wt 167.0 lb

## 2019-09-21 DIAGNOSIS — N921 Excessive and frequent menstruation with irregular cycle: Secondary | ICD-10-CM | POA: Diagnosis not present

## 2019-09-21 DIAGNOSIS — Z975 Presence of (intrauterine) contraceptive device: Secondary | ICD-10-CM | POA: Diagnosis not present

## 2019-09-21 MED ORDER — MEGESTROL ACETATE 40 MG PO TABS: ORAL_TABLET | ORAL | 1 refills | Status: DC

## 2019-09-21 NOTE — Progress Notes (Signed)
GYNECOLOGY PROBLEM VISIT NOTE  History:     Alexis Montgomery is a 29 y.o. [phone] female here for evaluation of heavy vaginal bleeding and frequent periods.  This is a recurrent problem, onset with menstrual cycle as a young woman. Patient states she has some level of bleeding every 1-2 weeks, consistent for her. She is s/p weight loss surgery 2 weeks ago and has recently been scheduled for iron infusions by her PCP. Denies abnormal discharge, pelvic pain, problems with intercourse or other gynecologic concerns.    Gynecologic History No LMP recorded. Patient has had an implant. Contraception: Nexplanon Last Pap: 06/2015. Results were: normal with negative HPV   Obstetric History OB History  Gravida Para Term Preterm AB Living  5 2 1 1 3 2   SAB TAB Ectopic Multiple Live Births  2 0 1 0 2    # Outcome Date GA Lbr Len/2nd Weight Sex Delivery Anes PTL Lv  5 Term 07/04/18 [redacted]w[redacted]d   M CS-LTranv  N LIV  4 Ectopic 06/2016          3 SAB 04/2016             Birth Comments: blighted ovum, GS [redacted]w[redacted]d, cytotec  2 Preterm 04/23/12 [redacted]w[redacted]d  6 lb 9.6 oz (2.995 kg) M CS-LTranv EPI  LIV     Complications: Failure to Progress in First Stage, Preeclampsia  1 SAB 2013            Past Medical History:  Diagnosis Date  . Anemia   . Benign essential HTN 10/09/2014  . Hidradenitis suppurativa 10/09/2014  . Menorrhagia with irregular cycle 06/11/2015  . Morbid obesity (HCC) 10/09/2014  . MRSA infection 10/09/2014  . Pregnancy induced hypertension   . Tachycardia     Past Surgical History:  Procedure Laterality Date  . CESAREAN SECTION N/A 04/23/2012   Procedure: CESAREAN SECTION;  Surgeon: 04/25/2012, MD;  Location: [facility];  Service: Obstetrics;  Laterality: N/A;  . CESAREAN SECTION N/A 07/04/2018   Procedure: CESAREAN SECTION;  Surgeon: 07/06/2018, MD;  Location: [facility];  Service: Obstetrics;  Laterality: N/A;  . DILATION AND CURETTAGE OF UTERUS N/A 07/07/2016   Procedure: SUCTION DILATATION  AND CURETTAGE;  Surgeon: 09/07/2016, MD;  Location: [facility];  Service: Gynecology;  Laterality: N/A;  . LAPAROSCOPIC ROUX-EN-Y GASTRIC BYPASS WITH UPPER ENDOSCOPY AND REMOVAL OF LAP BAND  06/08/2017    Current Outpatient Medications on File Prior to Visit  Medication Sig Dispense Refill  . acetaminophen (TYLENOL) 500 MG tablet Take 1 tablet (500 mg total) by mouth every 6 (six) hours as needed for mild pain or fever (or in between doses of pain medication for additional pain relief). 30 tablet 0  . diphenhydrAMINE (BENADRYL) 50 MG capsule Take 1 capsule by mouth at bedtime.    . ferrous sulfate 325 (65 FE) MG tablet Take by mouth.    08/08/2017 omeprazole (PRILOSEC) 20 MG capsule TAKE ONE CAPSULE (20MG  TOTAL) BY MOUTH DAILY 30 capsule 6  . vitamin B-12 (CYANOCOBALAMIN) 500 MCG tablet Take by mouth.    . Vitamin D, Ergocalciferol, (DRISDOL) 1.25 MG (50000 UNIT) CAPS capsule Take 50,000 Units by mouth once a week.     No current facility-administered medications on file prior to visit.    Allergies  Allergen Reactions  . Sulfa Antibiotics Hives    Social History:  reports that she has never smoked. She has never used smokeless tobacco. She reports that  she does not drink alcohol and does not use drugs.  Family History  Problem Relation Age of Onset  . HIV Mother   . Cancer Mother   . Heart Problems Paternal Grandfather   . Diabetes Paternal Uncle     The following portions of the patient's history were reviewed and updated as appropriate: allergies, current medications, past family history, past medical history, past social history, past surgical history and problem list.  Review of Systems Pertinent items noted in HPI and remainder of comprehensive ROS otherwise negative.  Physical Exam:  BP 125/84 (BP Location: Left Arm, Patient Position: Sitting, Cuff Size: Normal)   Pulse 80   Ht 5\' 2"  (1.575 m)   Wt 167 lb (75.8 kg)   BMI 30.54 kg/m  CONSTITUTIONAL: Well-developed,  well-nourished female in no acute distress.  SKIN: Skin is warm and dry.  NEUROLOGIC: Alert and oriented to person, place, and time.  PSYCHIATRIC: Normal mood and affect. Normal behavior. Normal judgment and thought content. CARDIOVASCULAR: Normal heart rate noted,  RESPIRATORY: Effort normal, no problems with respiration noted.    Assessment and Plan:    1. Menometrorrhagia - Advised consideration of IUD placement for AUB - Due for pap - Accompanied by toddler today, not able to perform - Health mx labs managed by PCP in [place] - megestrol (MEGACE) 40 MG tablet; 3x5d, 2x5d, then 1 daily to help control vaginal bleeding. Stop taking when bleeding stops.  Dispense: 45 tablet; Refill: 1  2. Nexplanon in place - Left arm - Placed 07/05/2018   Routine preventative health maintenance measures emphasized. Please refer to After Visit Summary for other counseling recommendations.   Follow-up: patient to schedule pap and IUD placement at her convenience     Total visit time 20 minutes. Greater than 50% of visit spent in counseling and coordination of care  07/07/2018, MSN, CNM Certified Nurse Midwife, [facility] for [facility] 09/21/19 9:25 AM

## 2019-09-21 NOTE — Patient Instructions (Signed)
Abnormal Uterine Bleeding °Abnormal uterine bleeding means bleeding more than usual from your uterus. It can include: °· Bleeding between periods. °· Bleeding after sex. °· Bleeding that is heavier than normal. °· Periods that last longer than usual. °· Bleeding after you have stopped having your period (menopause). °There are many problems that may cause this. You should see a doctor for any kind of bleeding that is not normal. Treatment depends on the cause of the bleeding. °Follow these instructions at home: °· Watch your condition for any changes. °· Do not use tampons, douche, or have sex, if your doctor tells you not to. °· Change your pads often. °· Get regular well-woman exams. Make sure they include a pelvic exam and cervical cancer screening. °· Keep all follow-up visits as told by your doctor. This is important. °Contact a doctor if: °· The bleeding lasts more than one week. °· You feel dizzy at times. °· You feel like you are going to throw up (nauseous). °· You throw up. °Get help right away if: °· You pass out. °· You have to change pads every hour. °· You have belly (abdominal) pain. °· You have a fever. °· You get sweaty. °· You get weak. °· You passing large blood clots from your vagina. °Summary °· Abnormal uterine bleeding means bleeding more than usual from your uterus. °· There are many problems that may cause this. You should see a doctor for any kind of bleeding that is not normal. °· Treatment depends on the cause of the bleeding. °This information is not intended to replace advice given to you by your health care provider. Make sure you discuss any questions you have with your health care provider. °Document Revised: 12/18/2015 Document Reviewed: 12/18/2015 °Elsevier Patient Education © 2020 [employer] ° °

## 2019-09-22 ENCOUNTER — Other Ambulatory Visit: Payer: Self-pay

## 2019-09-22 ENCOUNTER — Encounter: Payer: Medicaid Other | Admitting: Sports Medicine

## 2019-09-22 ENCOUNTER — Ambulatory Visit (INDEPENDENT_AMBULATORY_CARE_PROVIDER_SITE_OTHER): Payer: Medicaid Other

## 2019-09-22 ENCOUNTER — Encounter: Payer: Self-pay | Admitting: Sports Medicine

## 2019-09-22 ENCOUNTER — Ambulatory Visit (INDEPENDENT_AMBULATORY_CARE_PROVIDER_SITE_OTHER): Payer: Medicaid Other | Admitting: Sports Medicine

## 2019-09-22 DIAGNOSIS — M21619 Bunion of unspecified foot: Secondary | ICD-10-CM

## 2019-09-22 DIAGNOSIS — M21611 Bunion of right foot: Secondary | ICD-10-CM | POA: Diagnosis not present

## 2019-09-22 DIAGNOSIS — Z9889 Other specified postprocedural states: Secondary | ICD-10-CM

## 2019-09-22 NOTE — Progress Notes (Signed)
Subjective: Alexis Montgomery is a 29 y.o. female patient seen today in office for POV #  6 (DOS 06-20-19), S/P Left lapidus. Patient denies any current pain or symptoms reports that she feels like she is doing good able to wear normal shoes without any issues.  No other issues noted.  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   Kidney stone 07/21/2017   Postoperative intestinal malabsorption 07/13/2017   Iron deficiency 05/08/2017   H/O severe pre-e 03/03/2016   Previous cesarean section 03/03/2016   History of preterm delivery 03/03/2016   Nexplanon in place 06/11/2015   Chronic back pain 05/18/2015   Hidradenitis suppurativa 10/09/2014   History of chronic hypertension 10/09/2014    Current Outpatient Medications on File Prior to Visit  Medication Sig Dispense Refill   acetaminophen (TYLENOL) 500 MG tablet Take 1 tablet (500 mg total) by mouth every 6 (six) hours as needed for mild pain or fever (or in between doses of pain medication for additional pain relief). 30 tablet 0   diphenhydrAMINE (BENADRYL) 50 MG capsule Take 1 capsule by mouth at bedtime.     ferrous sulfate 325 (65 FE) MG tablet Take by mouth.     fluconazole (DIFLUCAN) 100 MG tablet Take 100 mg by mouth daily.     megestrol (MEGACE) 40 MG tablet 3x5d, 2x5d, then 1 daily to help control vaginal bleeding. Stop taking when bleeding stops. 45 tablet 1   omeprazole (PRILOSEC) 20 MG capsule TAKE ONE CAPSULE (20MG  TOTAL) BY MOUTH DAILY 30 capsule 6   vitamin B-12 (CYANOCOBALAMIN) 500 MCG tablet Take by mouth.     Vitamin D, Ergocalciferol, (DRISDOL) 1.25 MG (50000 UNIT)  CAPS capsule Take 50,000 Units by mouth once a week.     No current facility-administered medications on file prior to visit.    Allergies  Allergen Reactions   Sulfa Antibiotics Hives    Objective: There were no vitals filed for this visit.  General: No acute distress, AAOx3  Left foot: Surgical site well-healed, mild reactive hypertrophic scar, minimal edema no erythema, no warmth, no drainage, no signs of infection noted, Capillary fill time <3 seconds in all digits, gross sensation present via light touch to left foot. No pain or crepitation with range of motion left foot with 70 degrees of dorsiflexion and 15 degrees of plantarflexion at the first MPJ on the left, no pain with calf compression.  X-rays consistent with postoperative status hardware intact at first metatarsal cuneiform joint with no gapping loosening or dehiscence like previous  Assessment and Plan:  Problem List Items Addressed This Visit    None    Visit Diagnoses    Bunion    -  Primary   Relevant Orders   DG Foot Complete Left   Status post bunionectomy       S/P foot surgery, left           -Patient seen and evaluated -Xrays consistent with post op status  -May increase activities to tolarance -Encouraged ROM and Scar creams -Recommend the supportive shoes daily for foot type -Return PRN or sooner if problems or issues arise  , DPM

## 2020-04-04 ENCOUNTER — Ambulatory Visit (INDEPENDENT_AMBULATORY_CARE_PROVIDER_SITE_OTHER): Payer: Medicaid Other | Admitting: Nurse Practitioner

## 2020-04-04 ENCOUNTER — Encounter: Payer: Self-pay | Admitting: Nurse Practitioner

## 2020-04-04 ENCOUNTER — Other Ambulatory Visit: Payer: Self-pay

## 2020-04-04 VITALS — BP 121/85 | HR 84 | Ht 62.0 in | Wt 171.0 lb

## 2020-04-04 DIAGNOSIS — E162 Hypoglycemia, unspecified: Secondary | ICD-10-CM | POA: Diagnosis not present

## 2020-04-04 LAB — POCT GLYCOSYLATED HEMOGLOBIN (HGB A1C): Hemoglobin A1C: 4.8 % (ref 4.0–5.6)

## 2020-04-04 MED ORDER — ACCU-CHEK GUIDE VI STRP: ORAL_STRIP | 0 refills | Status: AC

## 2020-04-04 NOTE — Patient Instructions (Signed)
Hypoglycemia Hypoglycemia is when the sugar (glucose) level in your blood is too low. Low blood sugar can happen to people who have diabetes and people who do not have diabetes. Low blood sugar can happen quickly, and it can be an emergency. What are the causes? This condition happens most often in people who have diabetes and may be caused by:  Diabetes medicine.  Not eating enough, or not eating often enough.  Doing more physical activity.  Drinking alcohol on an empty stomach. If you do not have diabetes, hypoglycemia may be caused by:  A tumor in the pancreas.  Not eating enough, or not eating for long periods at a time (fasting).  A very bad infection or illness.  Problems after having weight loss (bariatric) surgery.  Kidney failure or liver failure.  Certain medicines. What increases the risk? This condition is more likely to develop in people who:  Have diabetes and take medicines to lower their blood sugar.  Abuse alcohol.  Have a very bad illness. What are the signs or symptoms? Symptoms depend on whether your low blood sugar is mild, moderate, or very low. Mild  Hunger.  Feeling worried or nervous (anxious).  Sweating and feeling clammy.  Feeling dizzy or light-headed.  Being sleepy or having trouble sleeping.  Feeling like you may vomit (nauseous).  A fast heartbeat.  A headache.  Blurry vision.  Being irritable or grouchy.  Tingling or loss of feeling (numbness) around your mouth, lips, or tongue.  Trouble with moving (coordination). Moderate  Confusion and poor judgment.  Behavior changes.  Weakness.  Uneven heartbeats. Very low Very low blood sugar (severe hypoglycemia) is a medical emergency. It can cause:  Fainting.  Jerky movements that you cannot control (seizure).  Loss of consciousness (coma).  Death. How is this treated? Treating low blood sugar Low blood sugar is often treated by eating or drinking something  sugary right away. The snack should contain 15 grams of a fast-acting carb (carbohydrate). Options include:  4 oz (120 mL) of fruit juice.  4-6 oz (120-150 mL) of regular soda (not diet soda).  8 oz (240 mL) of low-fat milk.  Several pieces of hard candy. Check food labels to find out how many to eat for 15 grams.  1 Tbsp (15 mL) of sugar or honey. Treating low blood sugar if you have diabetes If you can think clearly and swallow safely, follow the 15:15 rule:  Take 15 grams of a fast-acting carb. Talk with your doctor about how much you should take.  Always keep a source of fast-acting carb with you, such as: ? Sugar tablets (glucose pills). Take 4 pills. ? Several pieces of hard candy. Check food labels to see how many pieces to eat for 15 grams. ? 4 oz (120 mL) of fruit juice. ? 4-6 oz (120-150 mL) of regular (not diet) soda. ? 1 Tbsp (15 mL) of honey or sugar.  Check your blood sugar 15 minutes after you take the carb.  If your blood sugar is still at or below 70 mg/dL (3.9 mmol/L), take 15 grams of a carb again.  If your blood sugar does not go above 70 mg/dL (3.9 mmol/L) after 3 tries, get help right away.  After your blood sugar goes back to normal, eat a meal or a snack within 1 hour.   Treating very low blood sugar If your blood sugar is at or below 54 mg/dL (3 mmol/L), you have very low blood sugar, or severe   hypoglycemia. This is an emergency. Get medical help right away. If you have very low blood sugar and you cannot eat or drink, you will need to be given a hormone called glucagon. A family member or friend should learn how to check your blood sugar and how to give you glucagon. Ask your doctor if you need to have an emergency glucagon kit at home. Very low blood sugar may also need to be treated in a hospital. Follow these instructions at home: General instructions  Take over-the-counter and prescription medicines only as told by your doctor.  Stay aware of your  blood sugar as told by your doctor.  If you drink alcohol: ? Limit how much you use to:  0-1 drink a day for nonpregnant women.  0-2 drinks a day for men. ? Be aware of how much alcohol is in your drink. In the [place] one drink equals one 12 oz bottle of beer (355 mL), one 5 oz glass of wine (148 mL), or one 1 oz glass of hard liquor (44 mL).  Keep all follow-up visits as told by your doctor. This is important. If you have diabetes:  Always have a rapid-acting carb (15 grams) option with you to treat low blood sugar.  Follow your diabetes care plan as told by your doctor. Make sure you: ? Know the symptoms of low blood sugar. ? Check your blood sugar as often as told by your doctor. Always check it before and after exercise. ? Always check your blood sugar before you drive. ? Take your medicines as told. ? Follow your meal plan. ? Eat on time. Do not skip meals.  Share your diabetes care plan with: ? Your work or school. ? People you live with.  Carry a card or wear jewelry that says you have diabetes.   Contact a doctor if:  You have trouble keeping your blood sugar in your target range.  You have low blood sugar often. Get help right away if:  You still have symptoms after you eat or drink something that contains 15 grams of fast-acting carb and you cannot get your blood sugar above 70 mg/dL by following the 15:15 rule.  Your blood sugar is at or below 54 mg/dL (3 mmol/L).  You have a seizure.  You faint. These symptoms may be an emergency. Do not wait to see if the symptoms will go away. Get medical help right away. Call your local emergency services (911 in the [place] Do not drive yourself to the hospital. Summary  Hypoglycemia happens when the level of sugar (glucose) in your blood is too low.  Low blood sugar can happen to people who have diabetes and people who do not have diabetes. Low blood sugar can happen quickly, and it can be an emergency.  Make sure you  know the symptoms of low blood sugar and know how to treat it.  Always keep a source of sugar (fast-acting carb) with you to treat low blood sugar. This information is not intended to replace advice given to you by your health care provider. Make sure you discuss any questions you have with your health care provider. Document Revised: 11/17/2018 Document Reviewed: 11/17/2018 Elsevier Patient Education  2021 [employer].

## 2020-04-04 NOTE — Addendum Note (Signed)
Addended by: Jannifer Franklin A on: 04/04/2020 04:46 PM   Modules accepted: Orders

## 2020-04-04 NOTE — Progress Notes (Signed)
Endocrinology Consult Note                                            04/04/2020, 11:53 AM   Subjective:    Patient ID: Alexis Montgomery, female    DOB: 09-26-1990, PCP Lianne Moris, PA-C   Past Medical History:  Diagnosis Date  . Anemia   . Benign essential HTN 10/09/2014  . Hidradenitis suppurativa 10/09/2014  . Menorrhagia with irregular cycle 06/11/2015  . Morbid obesity (HCC) 10/09/2014  . MRSA infection 10/09/2014  . Pregnancy induced hypertension   . Tachycardia    Past Surgical History:  Procedure Laterality Date  . CESAREAN SECTION N/A 04/23/2012   Procedure: CESAREAN SECTION;  Surgeon: Tereso Newcomer, MD;  Location: [facility];  Service: Obstetrics;  Laterality: N/A;  . CESAREAN SECTION N/A 07/04/2018   Procedure: CESAREAN SECTION;  Surgeon: Conan Bowens, MD;  Location: [facility];  Service: Obstetrics;  Laterality: N/A;  . DILATION AND CURETTAGE OF UTERUS N/A 07/07/2016   Procedure: SUCTION DILATATION AND CURETTAGE;  Surgeon: Tilda Burrow, MD;  Location: [facility];  Service: Gynecology;  Laterality: N/A;  . LAPAROSCOPIC ROUX-EN-Y GASTRIC BYPASS WITH UPPER ENDOSCOPY AND REMOVAL OF LAP BAND  06/08/2017   Social History   Socioeconomic History  . Marital status: Legally Separated    Spouse name: JUSTIN   . Number of children: 2  . Years of education: Not on file  . Highest education level: High school graduate  Occupational History  . Not on file  Tobacco Use  . Smoking status: Never Smoker  . Smokeless tobacco: Never Used  Vaping Use  . Vaping Use: Never used  Substance and Sexual Activity  . Alcohol use: No    Alcohol/week: 0.0 standard drinks  . Drug use: No  . Sexual activity: Not Currently    Birth control/protection: None  Other Topics Concern  . Not on file  Social History Narrative  . Not on file   Social Determinants of Health   Financial Resource Strain: Not on file  Food Insecurity: Not on file  Transportation Needs: Not on file  Physical  Activity: Not on file  Stress: Not on file  Social Connections: Not on file   Family History  Problem Relation Age of Onset  . HIV Mother   . Cancer Mother   . Heart Problems Paternal Grandfather   . Diabetes Paternal Uncle    Outpatient Encounter Medications as of 04/04/2020  Medication Sig  . CALCIUM PO Take by mouth daily.  . Cholecalciferol (VITAMIN D) 125 MCG (5000 UT) CAPS Take by mouth daily.  Marland Kitchen glucose blood (ACCU-CHEK GUIDE) test strip Use as instructed to monitor glucose 4 times daily  . Inulin (FIBER CHOICE PO) Take by mouth daily.  . Multiple Vitamin (MULTIVITAMIN) tablet Take 1 tablet by mouth daily.  . ferrous sulfate 325 (65 FE) MG tablet Take by mouth.  Marland Kitchen omeprazole (PRILOSEC) 20 MG capsule TAKE ONE CAPSULE (20MG  TOTAL) BY MOUTH DAILY  . [DISCONTINUED] acetaminophen (TYLENOL) 500 MG tablet Take 1 tablet (500 mg total) by mouth every 6 (six) hours as needed for mild pain or fever (or in between doses of pain medication for additional pain relief).  . [DISCONTINUED] diphenhydrAMINE (BENADRYL) 50 MG capsule Take 1 capsule by mouth at bedtime.  . [DISCONTINUED] fluconazole (DIFLUCAN) 100  MG tablet Take 100 mg by mouth daily.  . [DISCONTINUED] megestrol (MEGACE) 40 MG tablet 3x5d, 2x5d, then 1 daily to help control vaginal bleeding. Stop taking when bleeding stops.  . [DISCONTINUED] vitamin B-12 (CYANOCOBALAMIN) 500 MCG tablet Take by mouth.  . [DISCONTINUED] Vitamin D, Ergocalciferol, (DRISDOL) 1.25 MG (50000 UNIT) CAPS capsule Take 50,000 Units by mouth once a week.   No facility-administered encounter medications on file as of 04/04/2020.   ALLERGIES: Allergies  Allergen Reactions  . Sulfa Antibiotics Hives    VACCINATION STATUS: Immunization History  Administered Date(s) Administered  . Influenza Whole 10/01/2011  . Influenza,inj,Quad PF,6+ Mos 10/09/2014  . Influenza-Unspecified 12/15/2017  . Tdap 04/24/2012, 04/12/2018, 12/31/2018    Hypoglycemia This is a  new problem. The current episode started more than 1 month ago. The problem occurs intermittently. The problem has been gradually improving. Associated symptoms include weakness. Associated symptoms comments: Sweating, shakiness. The symptoms are aggravated by eating. She has tried eating for the symptoms. The treatment provided moderate relief.   Alexis Montgomery is 30 y.o. female who presents today with a medical history as above. she is being seen in consultation for hypoglycemia requested by Lianne Moris, PA-C.  she has been dealing with symptoms of feeling shaky, weakness, and sweatiness for a couple of months as she had recently started watching what she was eating, in attempts to be healthier.  She went to her PCP who gave her a glucose meter to start checking her glucose when she had those feelings.  She reports about 2 hours after eating her meal, she would check her glucose due to onset of symptoms and get a reading in the 60's.  She has since started eating like she had been previously and her symptoms have improved.  She does have history of Gastric bypass in 2019 and was diagnosed as prediabetic prior to the surgery.   Review of systems  Constitutional: + Minimally fluctuating body weight,  current Body mass index is 31.28 kg/m. , no fatigue, no subjective hyperthermia, no subjective hypothermia Eyes: no blurry vision, no xerophthalmia ENT: no sore throat, no nodules palpated in throat, no dysphagia/odynophagia, no hoarseness Cardiovascular: no chest pain, no shortness of breath, no palpitations, no leg swelling Respiratory: no cough, no shortness of breath Gastrointestinal: no nausea/vomiting/diarrhea Musculoskeletal: no muscle/joint aches Skin: no rashes, no hyperemia Neurological: no tremors, no numbness, no tingling, no dizziness Psychiatric: no depression, no anxiety  Objective:    BP 121/85   Pulse 84   Ht 5\' 2"  (1.575 m)   Wt 171 lb (77.6 kg)   BMI 31.28 kg/m   Wt  Readings from Last 3 Encounters:  04/04/20 171 lb (77.6 kg)  09/21/19 167 lb (75.8 kg)  12/31/18 163 lb (73.9 kg)    BP Readings from Last 3 Encounters:  04/04/20 121/85  09/21/19 125/84  06/22/19 130/78     Physical Exam- Limited  Constitutional:  Body mass index is 31.28 kg/m. , not in acute distress, normal state of mind Eyes:  EOMI, no exophthalmos Neck: Supple Cardiovascular: RRR, no murmers, rubs, or gallops, no edema Respiratory: Adequate breathing efforts, no crackles, rales, rhonchi, or wheezing Musculoskeletal: no gross deformities, strength intact in all four extremities, no gross restriction of joint movements Skin:  no rashes, no hyperemia Neurological: no tremor with outstretched hands  CMP ( most recent) CMP     Component Value Date/Time   NA 137 06/04/2018 0813   NA 135 12/14/2017 1238   K 3.8  06/04/2018 0813   CL 107 06/04/2018 0813   CO2 21 (L) 06/04/2018 0813   GLUCOSE 77 06/04/2018 0813   BUN 10 06/04/2018 0813   BUN 12 12/14/2017 1238   CREATININE 0.47 07/04/2018 1415   CALCIUM 8.7 (L) 06/04/2018 0813   PROT 5.5 (L) 06/04/2018 0813   PROT 6.7 12/14/2017 1238   ALBUMIN 2.5 (L) 06/04/2018 0813   ALBUMIN 4.1 12/14/2017 1238   AST 18 06/04/2018 0813   ALT 17 06/04/2018 0813   ALKPHOS 81 06/04/2018 0813   BILITOT 0.3 06/04/2018 0813   BILITOT 0.3 12/14/2017 1238   GFRNONAA >60 07/04/2018 1415   GFRAA >60 07/04/2018 1415     Diabetic Labs (most recent): Lab Results  Component Value Date   HGBA1C <4.2 (L) 04/12/2018   HGBA1C 5.6 06/15/2014     Lipid Panel ( most recent) Lipid Panel  No results found for: CHOL, TRIG, HDL, CHOLHDL, VLDL, LDLCALC, LDLDIRECT, LABVLDL    Lab Results  Component Value Date   TSH 1.832 06/16/2014   TSH 1.024 06/15/2014           Assessment & Plan:   1. Hypoglycemia- suspect reactive hypoglycemia. - RASHEDA LEDGER  is being seen at a kind request of Lianne Moris, PA-C.  - I have reviewed her  available records and clinically evaluated the patient. - Based on these reviews, she likely has reactive hypoglycemia.  However, there is not sufficient information to proceed with definitive treatment plan.  -Her A1c today is 4.8%, ruling out underlying diabetes as a potential cause.  It is likely due to her recent change in diet and carb restriction.  We discussed proper diet. - The patient admits there is a room for improvement in their diet and drink choices. -  Suggestion is made for the patient to avoid simple carbohydrates from their diet including Cakes, Sweet Desserts / Pastries, Ice Cream, Soda (diet and regular), Sweet Tea, Candies, Chips, Cookies, Sweet Pastries,  Store Bought Juices, Alcohol in Excess of  1-2 drinks a day, Artificial Sweeteners, Coffee Creamer, and "Sugar-free" Products. This will help patient to have stable blood glucose profile and potentially avoid unintended weight gain.   - I encouraged the patient to switch to  unprocessed or minimally processed complex starch and increased protein intake (animal or plant source), fruits, and vegetables.   - Patient is advised to stick to a routine mealtimes to eat 3 meals  a day and avoid unnecessary snacks (to snack only to correct hypoglycemia).   She is encouraged to start monitoring blood glucose 4 times daily, before meals and before bed (and at any time she has symptoms of hypoglycemia) and log her readings on the clinic sheets provided to her.  - she will need Insulin and C-Peptide labs to help confirming the diagnosis.  -she will return in 2 week to review her repeat labs and glucose logs.   - I did not initiate any new prescriptions today. - she is advised to maintain close follow up with Lianne Moris, PA-C for primary care needs.   - Time spent with the patient: 60 minutes, of which >50% was spent in  counseling her about her hypoglycemia and the rest in obtaining information about her symptoms, reviewing her  previous labs/studies ( including abstractions from other facilities),  evaluations, and treatments,  and developing a plan to confirm diagnosis and long term treatment based on the latest standards of care/guidelines; and documenting her care.  Alexis Montgomery participated  in the discussions, expressed understanding, and voiced agreement with the above plans.  All questions were answered to her satisfaction. she is encouraged to contact clinic should she have any questions or concerns prior to her return visit.  Follow up plan: Return in about 2 weeks (around 04/18/2020) for hypoglycemic follow up with labs.   Ronny BaconWhitney Roslind Michaux, [facility] LLCFNP-BC [facility] [address] [place], [place] [address] Phone: [phone] Fax: [phone]    04/04/2020, 11:53 AM

## 2020-04-19 ENCOUNTER — Ambulatory Visit: Payer: Medicaid Other | Admitting: Nurse Practitioner

## 2020-07-24 ENCOUNTER — Other Ambulatory Visit: Payer: Self-pay | Admitting: Women's Health

## 2020-09-01 ENCOUNTER — Emergency Department (HOSPITAL_COMMUNITY)
Admission: EM | Admit: 2020-09-01 | Discharge: 2020-09-01 | Disposition: A | Discharge status: Home / Self Care | Payer: Medicaid Other | Attending: Emergency Medicine | Admitting: Emergency Medicine

## 2020-09-01 ENCOUNTER — Encounter (HOSPITAL_COMMUNITY): Payer: Self-pay

## 2020-09-01 ENCOUNTER — Other Ambulatory Visit: Payer: Self-pay

## 2020-09-01 DIAGNOSIS — Y92410 Unspecified street and highway as the place of occurrence of the external cause: Secondary | ICD-10-CM | POA: Diagnosis not present

## 2020-09-01 DIAGNOSIS — M25512 Pain in left shoulder: Secondary | ICD-10-CM | POA: Insufficient documentation

## 2020-09-01 DIAGNOSIS — I10 Essential (primary) hypertension: Secondary | ICD-10-CM | POA: Insufficient documentation
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
## 2021-03-14 ENCOUNTER — Other Ambulatory Visit: Payer: Self-pay | Admitting: Nurse Practitioner

## 2021-04-05 NOTE — Progress Notes (Signed)
? ? ?[facility] ?[address] ?[place], [place] [address] ? ? ?CLINIC:  ?Medical Oncology/Hematology ? ?CONSULT NOTE ? ?Patient Care Team: ?Dell Pontoarroll, Erin, PA-C as PCP - General (Family Medicine) ?Laqueta LindenKoneswaran, Suresh A, MD (Inactive) as PCP - Cardiology (Cardiology) ? ?CHIEF COMPLAINTS/PURPOSE OF CONSULTATION:  ?Iron deficiency anemia ? ?HISTORY OF PRESENTING ILLNESS:  ?Alexis Montgomery 31 y.o. female is here at the request of her bariatric physician, Dr. Seymour BarsKaren Bowen, for the evaluation and treatment of iron deficiency anemia. ? 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  Labs from 03/08/2021 show Hgb 10.9/MCV 80 with ferritin of 4. ? ?She is symptomatic with significant fatigue, pica cravings for ice, and lightheadedness.  No chest pain, dyspnea on exertion, headaches, syncope, or palpitations. ?She has heavy menstrual cycles that lasted up to 10 days, with the first 5 days described as "very heavy with lots of clots."  She denies any other bleeding such as bright red blood per rectum or melena. ? ?She previously took oral iron supplement, but without any improvement in her labs.  She has received IV iron infusions several times before hand at another facility.  She does not take any blood thinners or NSAIDs.  She takes omeprazole occasionally as needed.  She has no prior history of blood transfusion or blood donation.  She has never required EGD or colonoscopy. ? ?She reports family history of her maternal aunt with iron deficiency anemia but denies any family history of other blood conditions or malignancy. ? ? ?MEDICAL HISTORY:  ?Past Medical History:  ?Diagnosis Date  ? Anemia   ? Benign essential HTN 10/09/2014  ? Hidradenitis suppurativa 10/09/2014  ? Menorrhagia with irregular cycle 06/11/2015  ? Morbid obesity (HCC) 10/09/2014  ? MRSA infection 10/09/2014  ? Pregnancy induced hypertension   ? Tachycardia   ? ? ?SURGICAL HISTORY: ?Past Surgical  History:  ?Procedure Laterality Date  ? CESAREAN SECTION N/A 04/23/2012  ? Procedure: CESAREAN SECTION;  Surgeon: Tereso NewcomerUgonna A Anyanwu, MD;  Location: [facility];  Service: Obstetrics;  Laterality: N/A;  ? CESAREAN SECTION N/A 07/04/2018  ? Procedure: CESAREAN SECTION;  Surgeon: Conan Bowensavis, Kelly M, MD;  [facility];  Service: Obstetrics;  Laterality: N/A;  ? DILATION AND CURETTAGE OF UTERUS N/A 07/07/2016  ? Procedure: SUCTION DILATATION AND CURETTAGE;  Surgeon: Tilda BurrowFerguson, John V, MD;  Location: [facility];  Service: Gynecology;  Laterality: N/A;  ? LAPAROSCOPIC ROUX-EN-Y GASTRIC BYPASS WITH UPPER ENDOSCOPY AND REMOVAL OF LAP BAND  06/08/2017  ? ? ?SOCIAL HISTORY: ?Social History  ? ?Socioeconomic History  ? Marital status: Legally Separated  ?  Spouse name: JUSTIN   ? Number of children: 2  ? Years of education: Not on file  ? Highest education level: High school graduate  ?Occupational History  ? Not on file  ?Tobacco Use  ? Smoking status: Never  ? Smokeless tobacco: Never  ?Vaping Use  ? Vaping Use: Never used  ?Substance and Sexual Activity  ? Alcohol use: No  ?  Alcohol/week: 0.0 standard drinks  ? Drug use: No  ? Sexual activity: Not Currently  ?  Birth control/protection: None  ?Other Topics Concern  ? Not on file  ?Social History Narrative  ? Not on file  ? ?Social Determinants of Health  ? ?Financial Resource Strain: Not on file  ?Food Insecurity: Not on file  ?Transportation Needs: Not on file  ?Physical Activity: Not on file  ?Stress: Not on  file  ?Social Connections: Not on file  ?Intimate Partner Violence: Not on file  ? ? ?FAMILY HISTORY: ?Family History  ?Problem Relation Age of Onset  ? HIV Mother   ? Cancer Mother   ? Heart Problems Paternal Grandfather   ? Diabetes Paternal Uncle   ? ? ?ALLERGIES:  is allergic to sulfa antibiotics. ? ?MEDICATIONS:  ?Current Outpatient Medications  ?Medication Sig Dispense Refill  ? CALCIUM PO Take by mouth daily.    ? Cholecalciferol (VITAMIN D) 125 MCG (5000 UT) CAPS Take by  mouth daily.    ? ferrous sulfate 325 (65 FE) MG tablet Take by mouth.    ? glucose blood (ACCU-CHEK GUIDE) test strip Use as instructed to monitor glucose 4 times daily 100 each 0  ? Inulin (FIBER CHOICE PO) Take by mouth daily.    ? Multiple Vitamin (MULTIVITAMIN) tablet Take 1 tablet by mouth daily.    ? omeprazole (PRILOSEC) 20 MG capsule TAKE ONE CAPSULE (20MG  TOTAL) BY MOUTH DAILY 30 capsule 6  ? ?No current facility-administered medications for this visit.  ? ? ?REVIEW OF SYSTEMS:   ?Review of Systems  ?Constitutional:  Positive for fatigue. Negative for appetite change, chills, diaphoresis, fever and unexpected weight change.  ?HENT:   Negative for lump/mass and nosebleeds.   ?Eyes:  Negative for eye problems.  ?Respiratory:  Negative for cough, hemoptysis and shortness of breath.   ?Cardiovascular:  Negative for chest pain, leg swelling and palpitations.  ?Gastrointestinal:  Negative for abdominal pain, blood in stool, constipation, diarrhea, nausea and vomiting.  ?Genitourinary:  Negative for hematuria.   ?Musculoskeletal:  Positive for back pain.  ?Skin: Negative.   ?Neurological:  Negative for dizziness, headaches and light-headedness.  ?Hematological:  Does not bruise/bleed easily.   ? ? ?PHYSICAL EXAMINATION: ?ECOG PERFORMANCE STATUS: 1 - Symptomatic but completely ambulatory ? ?There were no vitals filed for this visit. ?There were no vitals filed for this visit. ? ?Physical Exam ?Constitutional:   ?   Appearance: Normal appearance. She is obese.  ?HENT:  ?   Head: Normocephalic and atraumatic.  ?   Mouth/Throat:  ?   Mouth: Mucous membranes are moist.  ?Eyes:  ?   Extraocular Movements: Extraocular movements intact.  ?   Pupils: Pupils are equal, round, and reactive to light.  ?Cardiovascular:  ?   Rate and Rhythm: Normal rate and regular rhythm.  ?   Pulses: Normal pulses.  ?   Heart sounds: Normal heart sounds.  ?Pulmonary:  ?   Effort: Pulmonary effort is normal.  ?   Breath sounds: Normal breath  sounds.  ?Abdominal:  ?   General: Bowel sounds are normal.  ?   Palpations: Abdomen is soft.  ?   Tenderness: There is no abdominal tenderness.  ?Musculoskeletal:     ?   General: No swelling.  ?   Right lower leg: No edema.  ?   Left lower leg: No edema.  ?Lymphadenopathy:  ?   Cervical: No cervical adenopathy.  ?Skin: ?   General: Skin is warm and dry.  ?Neurological:  ?   General: No focal deficit present.  ?   Mental Status: She is alert and oriented to person, place, and time.  ?Psychiatric:     ?   Mood and Affect: Mood normal.     ?   Behavior: Behavior normal.  ?  ? ? ?LABORATORY DATA:  ?I have reviewed the data as listed ?No results found for this or  any previous visit (from the past 2160 hour(s)). ? ?RADIOGRAPHIC STUDIES: ?I have personally reviewed the radiological images as listed and agreed with the findings in the report. ?No results found. ? ?ASSESSMENT & PLAN: ?1.  Iron deficiency anemia ?- Patient seen at the request of her bariatric physician, Dr. Seymour Bars, for the evaluation and treatment of iron deficiency anemia. 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 ?- Labs from 03/08/2021 show Hgb 10.9/MCV 80 with ferritin of 4. ?- Failed to improve on oral iron supplementation and has required IV iron in the past ?- Reports menorrhagia.  No melena or hematochezia. ?- Symptomatic with fatigue, ice cravings for pica, lightheadedness ?- PLAN: Recommend IV iron with Venofer 300 mg x 3. ?- Repeat labs and RTC in 8 weeks with phone visit ?- Her other nutritional deficiencies (B12 and vitamin D) are monitored and treated by her bariatric provider ? ?2.  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  She denies any tobacco, alcohol, or illicit drug use. ?- FAMILY: Maternal aunt with iron deficiency anemia.  No known family history of malignancy or  other blood conditions. ? ? ?PLAN SUMMARY & DISPOSITION: ?IV Venofer 300 mg x 3 ?Labs and phone visit in 8 weeks ? ?All questions were answered. The patient knows to call the clinic with any problems, que

## 2021-04-08 ENCOUNTER — Inpatient Hospital Stay (HOSPITAL_COMMUNITY): Payer: Medicaid Other | Attending: Physician Assistant | Admitting: Hematology

## 2021-04-08 ENCOUNTER — Encounter (HOSPITAL_COMMUNITY): Payer: Self-pay | Admitting: Hematology

## 2021-04-08 VITALS — BP 134/88 | HR 101 | Temp 98.8°F | Resp 18 | Ht 62.0 in | Wt 181.5 lb

## 2021-04-08 DIAGNOSIS — R7303 Prediabetes: Secondary | ICD-10-CM | POA: Diagnosis not present

## 2021-04-08 DIAGNOSIS — Z832 Family history of diseases of the blood and blood-forming organs and certain disorders involving the immune mechanism: Secondary | ICD-10-CM | POA: Diagnosis not present

## 2021-04-08 DIAGNOSIS — Z9884 Bariatric surgery status: Secondary | ICD-10-CM | POA: Insufficient documentation

## 2021-04-08 DIAGNOSIS — N920 Excessive and frequent menstruation with regular cycle: Secondary | ICD-10-CM | POA: Insufficient documentation

## 2021-04-08 DIAGNOSIS — D509 Iron deficiency anemia, unspecified: Secondary | ICD-10-CM | POA: Insufficient documentation

## 2021-04-08 DIAGNOSIS — I10 Essential (primary) hypertension: Secondary | ICD-10-CM | POA: Insufficient documentation

## 2021-04-08 DIAGNOSIS — K912 Postsurgical malabsorption, not elsewhere classified: Secondary | ICD-10-CM

## 2021-04-08 DIAGNOSIS — E611 Iron deficiency: Secondary | ICD-10-CM

## 2021-04-08 DIAGNOSIS — D500 Iron deficiency anemia secondary to blood loss (chronic): Secondary | ICD-10-CM

## 2021-04-08 DIAGNOSIS — E669 Obesity, unspecified: Secondary | ICD-10-CM

## 2021-04-08 NOTE — Patient Instructions (Addendum)
[facility] at [facility] ?Discharge Instructions ? ?You were seen today by Dr. Ellin Saba & Rojelio Brenner PA-C for your iron deficiency anemia.   ? ?We will schedule you for IV iron infusions (3 doses) to improve your iron and blood levels ? ?FOLLOW-UP APPOINTMENT: Repeat labs followed by phone visit in 2 months. ? ? ?Thank you for choosing [facility] at [facility] to provide your oncology and hematology care.  To afford each patient quality time with our provider, please arrive at least 15 minutes before your scheduled appointment time.  ? ?If you have a lab appointment with the Cancer Center please come in thru the Main Entrance and check in at the main information desk. ? ?You need to re-schedule your appointment should you arrive 10 or more minutes late.  We strive to give you quality time with our providers, and arriving late affects you and other patients whose appointments are after yours.  Also, if you no show three or more times for appointments you may be dismissed from the clinic at the providers discretion.     ?Again, thank you for choosing [facility].  Our hope is that these requests will decrease the amount of time that you wait before being seen by our physicians.       ?_____________________________________________________________ ? ?Should you have questions after your visit to [facility], please contact our office at [phone] and follow the prompts.  Our office hours are 8:00 a.m. and 4:30 p.m. Monday - Friday.  Please note that voicemails left after 4:00 p.m. may not be returned until the following business day.  We are closed weekends and major holidays.  You do have access to a nurse 24-7, just call the main number to the clinic [phone] and do not press any options, hold on the line and a nurse will answer the phone.   ? ?For prescription refill requests, have your pharmacy contact our office and allow  72 hours.   ? ?Due to Covid, you will need to wear a mask upon entering the hospital. If you do not have a mask, a mask will be given to you at the Main Entrance upon arrival. For doctor visits, patients may have 1 support person age 37 or older with them. For treatment visits, patients can not have anyone with them due to social distancing guidelines and our immunocompromised population.  ? ? ? ?

## 2021-04-18 ENCOUNTER — Inpatient Hospital Stay (HOSPITAL_COMMUNITY): Payer: Medicaid Other

## 2021-04-18 VITALS — BP 118/81 | HR 80 | Temp 97.8°F | Resp 20

## 2021-04-18 DIAGNOSIS — E611 Iron deficiency: Secondary | ICD-10-CM

## 2021-04-18 DIAGNOSIS — D509 Iron deficiency anemia, unspecified: Secondary | ICD-10-CM | POA: Diagnosis not present

## 2021-04-18 DIAGNOSIS — K912 Postsurgical malabsorption, not elsewhere classified: Secondary | ICD-10-CM

## 2021-04-18 DIAGNOSIS — D500 Iron deficiency anemia secondary to blood loss (chronic): Secondary | ICD-10-CM

## 2021-04-18 MED ORDER — SODIUM CHLORIDE 0.9 % IV SOLN
300.0000 mg | Freq: Once | INTRAVENOUS | Status: AC
  Administered 2021-04-18: 300 mg via INTRAVENOUS
  Filled 2021-04-18: qty 300

## 2021-04-18 MED ORDER — LORATADINE 10 MG PO TABS
10.0000 mg | ORAL_TABLET | Freq: Once | ORAL | Status: AC
  Administered 2021-04-18: 10 mg via ORAL
  Filled 2021-04-18: qty 1

## 2021-04-18 MED ORDER — SODIUM CHLORIDE 0.9 % IV SOLN: Freq: Once | INTRAVENOUS | Status: AC

## 2021-04-18 MED ORDER — ACETAMINOPHEN 325 MG PO TABS
650.0000 mg | ORAL_TABLET | Freq: Once | ORAL | Status: AC
  Administered 2021-04-18: 650 mg via ORAL
  Filled 2021-04-18: qty 2

## 2021-04-18 NOTE — Patient Instructions (Signed)
[facility]  Discharge Instructions: Thank you for choosing [facility] to provide your oncology and hematology care.  If you have a lab appointment with the Cancer Center, please come in thru the Main Entrance and check in at the main information desk.  Wear comfortable clothing and clothing appropriate for easy access to any Portacath or PICC line.   We strive to give you quality time with your provider. You may need to reschedule your appointment if you arrive late (15 or more minutes).  Arriving late affects you and other patients whose appointments are after yours.  Also, if you miss three or more appointments without notifying the office, you may be dismissed from the clinic at the provider's discretion.      For prescription refill requests, have your pharmacy contact our office and allow 72 hours for refills to be completed.    Today you received the following chemotherapy and/or immunotherapy agents Venofer      To help prevent nausea and vomiting after your treatment, we encourage you to take your nausea medication as directed.  BELOW ARE SYMPTOMS THAT SHOULD BE REPORTED IMMEDIATELY: *FEVER GREATER THAN 100.4 F (38 C) OR HIGHER *CHILLS OR SWEATING *NAUSEA AND VOMITING THAT IS NOT CONTROLLED WITH YOUR NAUSEA MEDICATION *UNUSUAL SHORTNESS OF BREATH *UNUSUAL BRUISING OR BLEEDING *URINARY PROBLEMS (pain or burning when urinating, or frequent urination) *BOWEL PROBLEMS (unusual diarrhea, constipation, pain near the anus) TENDERNESS IN MOUTH AND THROAT WITH OR WITHOUT PRESENCE OF ULCERS (sore throat, sores in mouth, or a toothache) UNUSUAL RASH, SWELLING OR PAIN  UNUSUAL VAGINAL DISCHARGE OR ITCHING   Items with * indicate a potential emergency and should be followed up as soon as possible or go to the Emergency Department if any problems should occur.  Please show the CHEMOTHERAPY ALERT CARD or IMMUNOTHERAPY ALERT CARD at check-in to the Emergency  Department and triage nurse.  Should you have questions after your visit or need to cancel or reschedule your appointment, please contact [facility] [phone]  and follow the prompts.  Office hours are 8:00 a.m. to 4:30 p.m. Monday - Friday. Please note that voicemails left after 4:00 p.m. may not be returned until the following business day.  We are closed weekends and major holidays. You have access to a nurse at all times for urgent questions. Please call the main number to the clinic [phone] and follow the prompts.  For any non-urgent questions, you may also contact your provider using MyChart. We now offer e-Visits for anyone 18 and older to request care online for non-urgent symptoms. For details visit [email or web address].   Also download the MyChart app! Go to the app store, search "MyChart", open the app, select [facility], and log in with your MyChart username and password.  Due to Covid, a mask is required upon entering the hospital/clinic. If you do not have a mask, one will be given to you upon arrival. For doctor visits, patients may have 1 support person aged 18 or older with them. For treatment visits, patients cannot have anyone with them due to current Covid guidelines and our immunocompromised population.  

## 2021-04-18 NOTE — Progress Notes (Signed)
Patient presents today for Venofer infusion per providers order.  Vital signs WNL.  Patient has no new complaints at this time.  Peripheral IV started and blood return noted pre and post infusion.  Venofer infusion given today per MD orders.  Stable during infusion without adverse affects.  Vital signs stable.  No complaints at this time.  Discharge from clinic ambulatory in stable condition.  Alert and oriented X 3.  [facility].  

## 2021-04-30 ENCOUNTER — Inpatient Hospital Stay (HOSPITAL_COMMUNITY): Payer: Medicaid Other

## 2021-04-30 VITALS — BP 116/82 | HR 84 | Temp 98.2°F | Resp 18

## 2021-04-30 DIAGNOSIS — D509 Iron deficiency anemia, unspecified: Secondary | ICD-10-CM | POA: Diagnosis not present

## 2021-04-30 DIAGNOSIS — D500 Iron deficiency anemia secondary to blood loss (chronic): Secondary | ICD-10-CM

## 2021-04-30 DIAGNOSIS — E611 Iron deficiency: Secondary | ICD-10-CM

## 2021-04-30 DIAGNOSIS — K912 Postsurgical malabsorption, not elsewhere classified: Secondary | ICD-10-CM

## 2021-04-30 MED ORDER — LORATADINE 10 MG PO TABS
10.0000 mg | ORAL_TABLET | Freq: Once | ORAL | Status: AC
  Administered 2021-04-30: 10 mg via ORAL
  Filled 2021-04-30: qty 1

## 2021-04-30 MED ORDER — SODIUM CHLORIDE 0.9 % IV SOLN
300.0000 mg | Freq: Once | INTRAVENOUS | Status: AC
  Administered 2021-04-30: 300 mg via INTRAVENOUS
  Filled 2021-04-30: qty 300

## 2021-04-30 MED ORDER — SODIUM CHLORIDE 0.9 % IV SOLN: Freq: Once | INTRAVENOUS | Status: AC

## 2021-04-30 MED ORDER — ACETAMINOPHEN 325 MG PO TABS
650.0000 mg | ORAL_TABLET | Freq: Once | ORAL | Status: AC
  Administered 2021-04-30: 650 mg via ORAL
  Filled 2021-04-30: qty 2

## 2021-04-30 NOTE — Progress Notes (Signed)
Iron infusion given per orders. Patient tolerated it well without problems. Vitals stable and discharged home from clinic ambulatory. Follow up as scheduled.  

## 2021-04-30 NOTE — Patient Instructions (Signed)
[facility]  Discharge Instructions: ?Thank you for choosing [facility] to provide your oncology and hematology care.  ?If you have a lab appointment with the Cancer Center, please come in thru the Main Entrance and check in at the main information desk. ? ? ? ?We strive to give you quality time with your provider. You may need to reschedule your appointment if you arrive late (15 or more minutes).  Arriving late affects you and other patients whose appointments are after yours.  Also, if you miss three or more appointments without notifying the office, you may be dismissed from the clinic at the provider?s discretion.    ?  ?For prescription refill requests, have your pharmacy contact our office and allow 72 hours for refills to be completed.   ? ?  ?To help prevent nausea and vomiting after your treatment, we encourage you to take your nausea medication as directed. ? ?BELOW ARE SYMPTOMS THAT SHOULD BE REPORTED IMMEDIATELY: ?*FEVER GREATER THAN 100.4 F (38 ?C) OR HIGHER ?*CHILLS OR SWEATING ?*NAUSEA AND VOMITING THAT IS NOT CONTROLLED WITH YOUR NAUSEA MEDICATION ?*UNUSUAL SHORTNESS OF BREATH ?*UNUSUAL BRUISING OR BLEEDING ?*URINARY PROBLEMS (pain or burning when urinating, or frequent urination) ?*BOWEL PROBLEMS (unusual diarrhea, constipation, pain near the anus) ?TENDERNESS IN MOUTH AND THROAT WITH OR WITHOUT PRESENCE OF ULCERS (sore throat, sores in mouth, or a toothache) ?UNUSUAL RASH, SWELLING OR PAIN  ?UNUSUAL VAGINAL DISCHARGE OR ITCHING  ? ?Items with * indicate a potential emergency and should be followed up as soon as possible or go to the Emergency Department if any problems should occur. ? ?Please show the CHEMOTHERAPY ALERT CARD or IMMUNOTHERAPY ALERT CARD at check-in to the Emergency Department and triage nurse. ? ?Should you have questions after your visit or need to cancel or reschedule your appointment, please contact [facility] [phone]  and follow  the prompts.  Office hours are 8:00 a.m. to 4:30 p.m. Monday - Friday. Please note that voicemails left after 4:00 p.m. may not be returned until the following business day.  We are closed weekends and major holidays. You have access to a nurse at all times for urgent questions. Please call the main number to the clinic [phone] and follow the prompts. ? ?For any non-urgent questions, you may also contact your provider using MyChart. We now offer e-Visits for anyone 18 and older to request care online for non-urgent symptoms. For details visit [email or web address]. ?  ?Also download the MyChart app! Go to the app store, search "MyChart", open the app, select [facility], and log in with your MyChart username and password. ? ?Due to Covid, a mask is required upon entering the hospital/clinic. If you do not have a mask, one will be given to you upon arrival. For doctor visits, patients may have 1 support person aged 18 or older with them. For treatment visits, patients cannot have anyone with them due to current Covid guidelines and our immunocompromised population.  ?

## 2021-05-07 ENCOUNTER — Ambulatory Visit (HOSPITAL_COMMUNITY): Payer: Medicaid Other

## 2021-05-13 ENCOUNTER — Inpatient Hospital Stay (HOSPITAL_COMMUNITY): Payer: Medicaid Other | Attending: Physician Assistant

## 2021-05-13 VITALS — BP 120/76 | HR 72 | Temp 98.1°F | Resp 18

## 2021-05-13 DIAGNOSIS — D509 Iron deficiency anemia, unspecified: Secondary | ICD-10-CM | POA: Diagnosis present

## 2021-05-13 DIAGNOSIS — D500 Iron deficiency anemia secondary to blood loss (chronic): Secondary | ICD-10-CM

## 2021-05-13 DIAGNOSIS — E611 Iron deficiency: Secondary | ICD-10-CM

## 2021-05-13 DIAGNOSIS — K912 Postsurgical malabsorption, not elsewhere classified: Secondary | ICD-10-CM

## 2021-05-13 MED ORDER — ACETAMINOPHEN 325 MG PO TABS
650.0000 mg | ORAL_TABLET | Freq: Once | ORAL | Status: AC
  Administered 2021-05-13: 650 mg via ORAL

## 2021-05-13 MED ORDER — SODIUM CHLORIDE 0.9 % IV SOLN
300.0000 mg | Freq: Once | INTRAVENOUS | Status: AC
  Administered 2021-05-13: 300 mg via INTRAVENOUS
  Filled 2021-05-13: qty 15

## 2021-05-13 MED ORDER — SODIUM CHLORIDE 0.9 % IV SOLN: Freq: Once | INTRAVENOUS | Status: AC

## 2021-05-13 MED ORDER — LORATADINE 10 MG PO TABS
10.0000 mg | ORAL_TABLET | Freq: Once | ORAL | Status: AC
  Administered 2021-05-13: 10 mg via ORAL
  Filled 2021-05-13: qty 1

## 2021-05-13 NOTE — Progress Notes (Signed)
Pt presents today for Venofer IV iron infusion per provider's order. Vital signs stable and pt voiced no new complaints at this time.  Peripheral IV started with good blood return pre and post infusion.  Venofer 300 mg  given today per MD orders. Tolerated infusion without adverse affects. Vital signs stable. No complaints at this time. Discharged from clinic ambulatory in stable condition. Alert and oriented x 3. F/U with [facility] as scheduled.   

## 2021-05-13 NOTE — Patient Instructions (Signed)
[facility]  Discharge Instructions: Thank you for choosing [facility] to provide your oncology and hematology care.  If you have a lab appointment with the Cancer Center, please come in thru the Main Entrance and check in at the main information desk.  Wear comfortable clothing and clothing appropriate for easy access to any Portacath or PICC line.   We strive to give you quality time with your provider. You may need to reschedule your appointment if you arrive late (15 or more minutes).  Arriving late affects you and other patients whose appointments are after yours.  Also, if you miss three or more appointments without notifying the office, you may be dismissed from the clinic at the provider's discretion.      For prescription refill requests, have your pharmacy contact our office and allow 72 hours for refills to be completed.    Today you received Venofer IV iron.   BELOW ARE SYMPTOMS THAT SHOULD BE REPORTED IMMEDIATELY: *FEVER GREATER THAN 100.4 F (38 C) OR HIGHER *CHILLS OR SWEATING *NAUSEA AND VOMITING THAT IS NOT CONTROLLED WITH YOUR NAUSEA MEDICATION *UNUSUAL SHORTNESS OF BREATH *UNUSUAL BRUISING OR BLEEDING *URINARY PROBLEMS (pain or burning when urinating, or frequent urination) *BOWEL PROBLEMS (unusual diarrhea, constipation, pain near the anus) TENDERNESS IN MOUTH AND THROAT WITH OR WITHOUT PRESENCE OF ULCERS (sore throat, sores in mouth, or a toothache) UNUSUAL RASH, SWELLING OR PAIN  UNUSUAL VAGINAL DISCHARGE OR ITCHING   Items with * indicate a potential emergency and should be followed up as soon as possible or go to the Emergency Department if any problems should occur.  Please show the CHEMOTHERAPY ALERT CARD or IMMUNOTHERAPY ALERT CARD at check-in to the Emergency Department and triage nurse.  Should you have questions after your visit or need to cancel or reschedule your appointment, please contact [facility] [phone]   and follow the prompts.  Office hours are 8:00 a.m. to 4:30 p.m. Monday - Friday. Please note that voicemails left after 4:00 p.m. may not be returned until the following business day.  We are closed weekends and major holidays. You have access to a nurse at all times for urgent questions. Please call the main number to the clinic [phone] and follow the prompts.  For any non-urgent questions, you may also contact your provider using MyChart. We now offer e-Visits for anyone 18 and older to request care online for non-urgent symptoms. For details visit [email or web address].   Also download the MyChart app! Go to the app store, search "MyChart", open the app, select [facility], and log in with your MyChart username and password.  Due to Covid, a mask is required upon entering the hospital/clinic. If you do not have a mask, one will be given to you upon arrival. For doctor visits, patients may have 1 support person aged 18 or older with them. For treatment visits, patients cannot have anyone with them due to current Covid guidelines and our immunocompromised population.  

## 2021-06-20 ENCOUNTER — Inpatient Hospital Stay (HOSPITAL_COMMUNITY): Payer: Medicaid Other

## 2021-06-21 ENCOUNTER — Other Ambulatory Visit (HOSPITAL_COMMUNITY): Payer: Medicaid Other

## 2021-06-24 ENCOUNTER — Inpatient Hospital Stay (HOSPITAL_COMMUNITY): Payer: Medicaid Other | Attending: Physician Assistant

## 2021-06-24 DIAGNOSIS — D509 Iron deficiency anemia, unspecified: Secondary | ICD-10-CM | POA: Insufficient documentation

## 2021-06-26 ENCOUNTER — Inpatient Hospital Stay (HOSPITAL_COMMUNITY): Payer: Medicaid Other

## 2021-06-26 DIAGNOSIS — D500 Iron deficiency anemia secondary to blood loss (chronic): Secondary | ICD-10-CM

## 2021-06-26 DIAGNOSIS — D509 Iron deficiency anemia, unspecified: Secondary | ICD-10-CM | POA: Diagnosis present

## 2021-06-26 LAB — CBC WITH DIFFERENTIAL/PLATELET
Abs Immature Granulocytes: 0.02 10*3/uL (ref 0.00–0.07)
Basophils Absolute: 0.1 10*3/uL (ref 0.0–0.1)
Basophils Relative: 2 %
Eosinophils Absolute: 0.2 10*3/uL (ref 0.0–0.5)
Eosinophils Relative: 3 %
HCT: 39.1 % (ref 36.0–46.0)
Hemoglobin: 12.7 g/dL (ref 12.0–15.0)
Immature Granulocytes: 0 %
Lymphocytes Relative: 35 %
Lymphs Abs: 2.4 10*3/uL (ref 0.7–4.0)
MCH: 27.3 pg (ref 26.0–34.0)
MCHC: 32.5 g/dL (ref 30.0–36.0)
MCV: 83.9 fL (ref 80.0–100.0)
Monocytes Absolute: 0.5 10*3/uL (ref 0.1–1.0)
Monocytes Relative: 8 %
Neutro Abs: 3.5 10*3/uL (ref 1.7–7.7)
Neutrophils Relative %: 52 %
Platelets: 340 10*3/uL (ref 150–400)
RBC: 4.66 MIL/uL (ref 3.87–5.11)
RDW: 18.4 % — ABNORMAL HIGH (ref 11.5–15.5)
WBC: 6.7 10*3/uL (ref 4.0–10.5)
nRBC: 0 % (ref 0.0–0.2)

## 2021-06-26 LAB — IRON AND TIBC
Iron: 62 ug/dL (ref 28–170)
Saturation Ratios: 15 % (ref 10.4–31.8)
TIBC: 413 ug/dL (ref 250–450)
UIBC: 351 ug/dL

## 2021-06-26 LAB — FERRITIN: Ferritin: 12 ng/mL (ref 11–307)

## 2021-06-26 NOTE — Progress Notes (Unsigned)
   Virtual Visit via Telephone Note [facility]  I connected with Alexis Montgomery  on *** at  *** by telephone and verified that I am speaking with the correct person using two identifiers.  Location: Patient: Home Provider: [facility]   I discussed the limitations, risks, security and privacy concerns of performing an evaluation and management service by telephone and the availability of in person appointments. I also discussed with the patient that there may be a patient responsible charge related to this service. The patient expressed understanding and agreed to proceed.  REASON FOR VISIT: Iron deficiency anemia  PRIOR THERAPY: Oral iron supplement  CURRENT THERAPY: Rehman IV iron (Venofer 300 mg x 3 from 04/18/2021 through 05/13/2021)  INTERVAL HISTORY: Alexis Montgomery is contacted today for follow-up of her iron deficiency anemia.  She was last seen on 04/08/2021 by Rojelio Brenner and Dr. Ellin Saba for initial consultation. At today's visit, she reports feeling ***. Symptoms after IV iron *** *** Fatigue *** Pica *** Lightheadedness No chest pain, dyspnea on exertion, headaches, syncope, or palpitations. She has heavy menstrual cycles that last up to 10 days, with the first 5 days described as "very heavy with lots of clots."  She denies any other bleeding such as bright red blood per rectum or melena. She reports *** energy and *** appetite.    OBSERVATIONS/OBJECTIVE: ROS ***  PHYSICAL EXAM (per limitations of virtual telephone visit): The patient is alert and oriented x 3, exhibiting adequate mentation, good mood, and ability to speak in full sentences and execute sound judgement.   ASSESSMENT & PLAN: 1.  Iron deficiency anemia - Patient seen at the request of her bariatric physician, Dr. Seymour Bars, for the evaluation and treatment of iron deficiency anemia. 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 - Labs from 03/08/2021 show Hgb 10.9/MCV 80 with ferritin of 4. - Failed to improve on oral iron supplementation and has required IV iron in the past - IV Venofer 300 mg x 3 from 04/18/2021 through 05/13/2021 - Reports menorrhagia.  No melena or hematochezia. - Symptomatic with fatigue, ice cravings for pica, lightheadedness *** - Most recent labs (06/26/2021): Improved Hgb 12.7/MCV 83.9, ferritin 12, iron saturation 15% - PLAN: Anemia resolved with IV iron supplementation, but she has significant residual iron deficiency.  Recommend additional IV iron with Venofer 300 mg x 3. - Repeat labs and RTC in 3 months with phone visit - Her other nutritional deficiencies (B12 and vitamin D) are monitored and treated by her bariatric provider   2.  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  She denies any tobacco, alcohol, or illicit drug use. - FAMILY: Maternal aunt with iron deficiency anemia.  No known family history of malignancy or other blood conditions.   FOLLOW UP INSTRUCTIONS: ***    I discussed the assessment and treatment plan with the patient. The patient was provided an opportunity to ask questions and all were answered. The patient agreed with the plan and demonstrated an understanding of the instructions.   The patient was advised to call back or seek an in-person evaluation if the symptoms worsen or if the condition fails to improve as anticipated.  I provided *** minutes of non-face-to-face time during this encounter.   Carnella Gu[place]ajara, PA-C ***

## 2021-06-27 ENCOUNTER — Inpatient Hospital Stay (HOSPITAL_BASED_OUTPATIENT_CLINIC_OR_DEPARTMENT_OTHER): Payer: Medicaid Other | Admitting: Physician Assistant

## 2021-06-27 DIAGNOSIS — Z9884 Bariatric surgery status: Secondary | ICD-10-CM | POA: Diagnosis not present

## 2021-06-27 DIAGNOSIS — D500 Iron deficiency anemia secondary to blood loss (chronic): Secondary | ICD-10-CM

## 2021-06-27 DIAGNOSIS — K912 Postsurgical malabsorption, not elsewhere classified: Secondary | ICD-10-CM

## 2021-07-12 ENCOUNTER — Other Ambulatory Visit: Payer: Self-pay

## 2021-07-12 ENCOUNTER — Encounter (HOSPITAL_COMMUNITY): Payer: Self-pay

## 2021-07-12 ENCOUNTER — Emergency Department (HOSPITAL_COMMUNITY)
Admission: EM | Admit: 2021-07-12 | Discharge: 2021-07-13 | Disposition: A | Discharge status: Home / Self Care | Payer: Medicaid Other | Attending: Student | Admitting: Student

## 2021-07-12 DIAGNOSIS — R109 Unspecified abdominal pain: Secondary | ICD-10-CM | POA: Diagnosis present

## 2021-07-12 DIAGNOSIS — R1084 Generalized abdominal pain: Secondary | ICD-10-CM

## 2021-07-12 DIAGNOSIS — R197 Diarrhea, unspecified: Secondary | ICD-10-CM | POA: Insufficient documentation

## 2021-07-12 DIAGNOSIS — N83202 Unspecified ovarian cyst, left side: Secondary | ICD-10-CM | POA: Diagnosis not present

## 2021-07-12 DIAGNOSIS — R112 Nausea with vomiting, unspecified: Secondary | ICD-10-CM | POA: Diagnosis not present

## 2021-07-12 LAB — BASIC METABOLIC PANEL
Anion gap: 6 (ref 5–15)
BUN: 16 mg/dL (ref 6–20)
CO2: 22 mmol/L (ref 22–32)
Calcium: 9 mg/dL (ref 8.9–10.3)
Chloride: 107 mmol/L (ref 98–111)
Creatinine, Ser: 0.56 mg/dL (ref 0.44–1.00)
GFR, Estimated: >60 mL/min (ref >60)
Glucose, Bld: 117 mg/dL — ABNORMAL HIGH (ref 70–99)
Potassium: 4.2 mmol/L (ref 3.5–5.1)
Sodium: 135 mmol/L (ref 135–145)

## 2021-07-12 LAB — CBC WITH DIFFERENTIAL/PLATELET
Abs Immature Granulocytes: 0.05 10*3/uL (ref 0.00–0.07)
Basophils Absolute: 0.1 10*3/uL (ref 0.0–0.1)
Basophils Relative: 1 %
Eosinophils Absolute: 0 10*3/uL (ref 0.0–0.5)
Eosinophils Relative: 0 %
HCT: 39.7 % (ref 36.0–46.0)
Hemoglobin: 13.1 g/dL (ref 12.0–15.0)
Immature Granulocytes: 0 %
Lymphocytes Relative: 9 %
Lymphs Abs: 1.2 10*3/uL (ref 0.7–4.0)
MCH: 27.5 pg (ref 26.0–34.0)
MCHC: 33 g/dL (ref 30.0–36.0)
MCV: 83.2 fL (ref 80.0–100.0)
Monocytes Absolute: 0.2 10*3/uL (ref 0.1–1.0)
Monocytes Relative: 2 %
Neutro Abs: 10.9 10*3/uL — ABNORMAL HIGH (ref 1.7–7.7)
Neutrophils Relative %: 88 %
Platelets: 266 10*3/uL (ref 150–400)
RBC: 4.77 MIL/uL (ref 3.87–5.11)
RDW: 16.4 % — ABNORMAL HIGH (ref 11.5–15.5)
WBC: 12.4 10*3/uL — ABNORMAL HIGH (ref 4.0–10.5)
nRBC: 0 % (ref 0.0–0.2)

## 2021-07-12 LAB — HEPATIC FUNCTION PANEL
ALT: 19 U/L (ref 0–44)
AST: 16 U/L (ref 15–41)
Albumin: 3.9 g/dL (ref 3.5–5.0)
Alkaline Phosphatase: 66 U/L (ref 38–126)
Bilirubin, Direct: 0.1 mg/dL (ref 0.0–0.2)
Indirect Bilirubin: 0.3 mg/dL (ref 0.3–0.9)
Total Bilirubin: 0.4 mg/dL (ref 0.3–1.2)
Total Protein: 7.5 g/dL (ref 6.5–8.1)

## 2021-07-12 LAB — LIPASE, BLOOD: Lipase: 29 U/L (ref 11–51)

## 2021-07-12 MED ORDER — ONDANSETRON HCL 4 MG/2ML IJ SOLN
4.0000 mg | Freq: Once | INTRAMUSCULAR | Status: AC
  Administered 2021-07-12: 4 mg via INTRAVENOUS
  Filled 2021-07-12: qty 2

## 2021-07-12 MED ORDER — SODIUM CHLORIDE 0.9 % IV BOLUS
1000.0000 mL | Freq: Once | INTRAVENOUS | Status: AC
  Administered 2021-07-12: 1000 mL via INTRAVENOUS

## 2021-07-12 MED ORDER — DICYCLOMINE HCL 10 MG PO CAPS
10.0000 mg | ORAL_CAPSULE | Freq: Once | ORAL | Status: AC
  Administered 2021-07-12: 10 mg via ORAL
  Filled 2021-07-12: qty 1

## 2021-07-12 MED ORDER — ALUM & MAG HYDROXIDE-SIMETH 200-200-20 MG/5ML PO SUSP
30.0000 mL | Freq: Once | ORAL | Status: AC
  Administered 2021-07-12: 30 mL via ORAL
  Filled 2021-07-12: qty 30

## 2021-07-12 MED ORDER — FENTANYL CITRATE PF 50 MCG/ML IJ SOSY
50.0000 ug | PREFILLED_SYRINGE | Freq: Once | INTRAMUSCULAR | Status: AC
  Administered 2021-07-12: 50 ug via INTRAVENOUS
  Filled 2021-07-12: qty 1

## 2021-07-12 NOTE — ED Notes (Signed)
Pt ambulated to the bathroom unassisted.  

## 2021-07-12 NOTE — ED Provider Notes (Incomplete)
[facility] EMERGENCY DEPARTMENT Provider Note   CSN: [id number] Arrival date & time: 07/12/21  1939     History Chief Complaint  Patient presents with  . Abdominal Pain    Alexis Montgomery is a 31 y.o. female with history of gastric bypass surgery who presents to the emergency room today with intractable nausea, vomiting, and diarrhea.  This started this morning.  Patient went to bed feeling normal.  She denies any bad food intake.  Denies any urinary complaints, fever, chills, chest pain, shortness of breath.  Patient unable to keep any solids or fluids down.   Abdominal Pain      Home Medications Prior to Admission medications   Medication Sig Start Date End Date Taking? Authorizing Provider  acetaminophen (TYLENOL) 500 MG tablet Take by mouth.    [provider]  CALCIUM PO Take by mouth daily.    [provider]  Cholecalciferol (VITAMIN D) 125 MCG (5000 UT) CAPS Take by mouth daily.    [provider]  ferrous sulfate 325 (65 FE) MG tablet Take by mouth.    [provider]  glucose blood (ACCU-CHEK GUIDE) test strip Use as instructed to monitor glucose 4 times daily 04/04/20   Dani Gobble, NP  Inulin (FIBER CHOICE PO) Take by mouth daily.    [provider]  Multiple Vitamin (MULTIVITAMIN) tablet Take 1 tablet by mouth daily.    [provider]  omeprazole (PRILOSEC) 20 MG capsule TAKE ONE CAPSULE (20MG  TOTAL) BY MOUTH DAILY 04/26/19   04/28/19, CNM  phentermine 15 MG capsule Take 15 mg by mouth every morning. 03/08/21   [provider]  topiramate (TOPAMAX) 25 MG tablet Take 25 mg by mouth daily. 04/02/21   [provider]  Vitamin D, Ergocalciferol, (DRISDOL) 1.25 MG (50000 UNIT) CAPS capsule Take 50,000 Units by mouth once a week. 03/11/21   [provider]      Allergies    Sulfa antibiotics    Review of Systems   Review of Systems  Gastrointestinal:  Positive for abdominal  pain.  All other systems reviewed and are negative.   Physical Exam Updated Vital Signs BP 130/75 (BP Location: Left Arm)   Pulse 87   Temp 98.1 F (36.7 C) (Oral)   Resp 18   Ht 5\' 2"  (1.575 m)   Wt 81.6 kg   SpO2 100%   BMI 32.92 kg/m  Physical Exam Vitals and nursing note reviewed.  Constitutional:      General: She is not in acute distress.    Appearance: Normal appearance.  HENT:     Head: Normocephalic and atraumatic.  Eyes:     General:        Right eye: No discharge.        Left eye: No discharge.  Cardiovascular:     Comments: Regular rate and rhythm.  S1/S2 are distinct without any evidence of murmur, rubs, or gallops.  Radial pulses are 2+ bilaterally.  Dorsalis pedis pulses are 2+ bilaterally.  No evidence of pedal edema. Pulmonary:     Comments: Clear to auscultation bilaterally.  Normal effort.  No respiratory distress.  No evidence of wheezes, rales, or rhonchi heard throughout. Abdominal:     General: Abdomen is flat. Bowel sounds are normal. There is no distension.     Tenderness: There is abdominal tenderness in the epigastric area. There is no guarding or rebound.  Musculoskeletal:        General:  Normal range of motion.     Cervical back: Neck supple.  Skin:    General: Skin is warm and dry.     Findings: No rash.  Neurological:     General: No focal deficit present.     Mental Status: She is alert.  Psychiatric:        Mood and Affect: Mood normal.        Behavior: Behavior normal.     ED Results / Procedures / Treatments   Labs (all labs ordered are listed, but only abnormal results are displayed) Labs Reviewed  CBC WITH DIFFERENTIAL/PLATELET - Abnormal; Notable for the following components:      Result Value   WBC 12.4 (*)    RDW 16.4 (*)    Neutro Abs 10.9 (*)    All other components within normal limits  BASIC METABOLIC PANEL - Abnormal; Notable for the following components:   Glucose, Bld 117 (*)    All other components within  normal limits    EKG None  Radiology No results found.  Procedures Procedures    Medications Ordered in ED Medications  sodium chloride 0.9 % bolus 1,000 mL (has no administration in time range)  ondansetron (ZOFRAN) injection 4 mg (has no administration in time range)    ED Course/ Medical Decision Making/ A&P Clinical Course as of 07/12/21 2354  Fri Jul 12, 2021  2242 CBC with Differential(!) There is evidence of leukocytosis. [CF]  2242 Basic metabolic panel(!) Mildly elevated glucose with no significant abnormalities. [CF]  2242 Lipase, blood Normal. [CF]  2242 Hepatic function panel Normal. [CF]  2242 On repeat evaluation, patient states that she is feeling little better but still having a lot of epigastric abdominal pain.  We will try GI cocktail. [CF]  2332 On reevaluation, patient still has not had any vomiting and passed p.o. challenges.  Patient still having epigastric abdominal pain which she describes as sharp. She does have a history of gall stones and is concerned about her gallbladder which she did not elude to in the beginning of the interview.  [CF]    Clinical Course User Index [CF] Alexis Lower, PA-C                           Medical Decision Making [name] is a 31 y.o. female patient presents the [facility] apartment today for further evaluation of epigastric abdominal pain, nausea, vomiting, and diarrhea.  This could be a complication of her gastric bypass that she does mention that she gets these episodes from time to time.  This could also be a viral gastroenteritis.  Other things on the differential include gastritis, PUD, reflux, pancreatitis.  Have added on hepatic function panel, lipase in addition to fluids and Zofran.  We will plan to reassess.   Amount and/or Complexity of Data Reviewed Labs: ordered. Decision-making details documented in ED Course. Radiology: ordered.  Risk OTC drugs. Prescription drug  management.   ***  {Document critical care time when appropriate:1} {Document review of labs and clinical decision tools ie heart score, Chads2Vasc2 etc:1}  {Document your independent review of radiology images, and any outside records:1} {Document your discussion with family members, caretakers, and with consultants:1} {Document social determinants of health affecting pt's care:1} {Document your decision making why or why not admission, treatments were needed:1} Final Clinical Impression(s) / ED Diagnoses Final diagnoses:  None    Rx / DC Orders ED Discharge Orders  None       

## 2021-07-12 NOTE — ED Triage Notes (Signed)
Pt arrived via POV c/o abdominal pain, N/V/D. Pt reports having weight loss surgery in 2019 and is concerned she may have "messed something up" with her new job and hurt something in her stomach.

## 2021-07-12 NOTE — ED Provider Notes (Signed)
[facility] EMERGENCY DEPARTMENT Provider Note   CSN: [id number] Arrival date & time: 07/12/21  1939     History Chief Complaint  Patient presents with   Abdominal Pain    Alexis Montgomery is a 31 y.o. female with history of gastric bypass surgery who presents to the emergency room today with intractable nausea, vomiting, and diarrhea.  This started this morning.  Patient went to bed feeling normal.  She denies any bad food intake.  Denies any urinary complaints, fever, chills, chest pain, shortness of breath.  Patient unable to keep any solids or fluids down.   Abdominal Pain      Home Medications Prior to Admission medications   Medication Sig Start Date End Date Taking? Authorizing Provider  acetaminophen (TYLENOL) 500 MG tablet Take by mouth.   Yes [provider]  omeprazole (PRILOSEC) 20 MG capsule TAKE ONE CAPSULE (20MG  TOTAL) BY MOUTH DAILY Patient taking differently: Take 20 mg by mouth daily as needed (acid reflux). 04/26/19  Yes Booker, 04/28/19, CNM  Vitamin D, Ergocalciferol, (DRISDOL) 1.25 MG (50000 UNIT) CAPS capsule Take 50,000 Units by mouth once a week. 03/11/21  Yes [provider]  fluconazole (DIFLUCAN) 100 MG tablet Take 100 mg by mouth daily. Patient not taking: Reported on 07/12/2021 07/03/21   [provider]  glucose blood (ACCU-CHEK GUIDE) test strip Use as instructed to monitor glucose 4 times daily 04/04/20   04/06/20, NP  phentermine 15 MG capsule Take 15 mg by mouth every morning. Patient not taking: Reported on 07/12/2021 03/08/21   [provider]  topiramate (TOPAMAX) 25 MG tablet Take 25 mg by mouth daily. Patient not taking: Reported on 07/12/2021 04/02/21   [provider]      Allergies    Sulfa antibiotics    Review of Systems   Review of Systems  Gastrointestinal:  Positive for abdominal pain.  All other systems reviewed and are negative.   Physical Exam Updated Vital Signs BP 100/84    Pulse 84   Temp 98.1 F (36.7 C) (Oral)   Resp 18   Ht 5\' 2"  (1.575 m)   Wt 81.6 kg   SpO2 99%   BMI 32.92 kg/m  Physical Exam Vitals and nursing note reviewed.  Constitutional:      General: She is not in acute distress.    Appearance: Normal appearance.  HENT:     Head: Normocephalic and atraumatic.  Eyes:     General:        Right eye: No discharge.        Left eye: No discharge.  Cardiovascular:     Comments: Regular rate and rhythm.  S1/S2 are distinct without any evidence of murmur, rubs, or gallops.  Radial pulses are 2+ bilaterally.  Dorsalis pedis pulses are 2+ bilaterally.  No evidence of pedal edema. Pulmonary:     Comments: Clear to auscultation bilaterally.  Normal effort.  No respiratory distress.  No evidence of wheezes, rales, or rhonchi heard throughout. Abdominal:     General: Abdomen is flat. Bowel sounds are normal. There is no distension.     Tenderness: There is abdominal tenderness in the epigastric area. There is no guarding or rebound.  Musculoskeletal:        General: Normal range of motion.     Cervical back: Neck supple.  Skin:    General: Skin is warm and dry.     Findings: No rash.  Neurological:  General: No focal deficit present.     Mental Status: She is alert.  Psychiatric:        Mood and Affect: Mood normal.        Behavior: Behavior normal.     ED Results / Procedures / Treatments   Labs (all labs ordered are listed, but only abnormal results are displayed) Labs Reviewed  CBC WITH DIFFERENTIAL/PLATELET - Abnormal; Notable for the following components:      Result Value   WBC 12.4 (*)    RDW 16.4 (*)    Neutro Abs 10.9 (*)    All other components within normal limits  BASIC METABOLIC PANEL - Abnormal; Notable for the following components:   Glucose, Bld 117 (*)    All other components within normal limits  HEPATIC FUNCTION PANEL  LIPASE, BLOOD    EKG None  Radiology CT ABDOMEN PELVIS W CONTRAST  Result Date:  07/13/2021 CLINICAL DATA:  Epigastric pain and history of prior bariatric surgery EXAM: CT ABDOMEN AND PELVIS WITH CONTRAST TECHNIQUE: Multidetector CT imaging of the abdomen and pelvis was performed using the standard protocol following bolus administration of intravenous contrast. RADIATION DOSE REDUCTION: This exam was performed according to the departmental dose-optimization program which includes automated exposure control, adjustment of the mA and/or kV according to patient size and/or use of iterative reconstruction technique. CONTRAST:  OMNIPAQUE IOHEXOL 300 MG/ML  SOLN COMPARISON:  None Available. FINDINGS: Lower chest: No acute abnormality. Hepatobiliary: Liver is well visualized and within normal limits. Gallbladder is well distended with cholelithiasis. No wall thickening or pericholecystic fluid is noted. Pancreas: Unremarkable. No pancreatic ductal dilatation or surrounding inflammatory changes. Spleen: Normal in size without focal abnormality. Adrenals/Urinary Tract: Adrenal glands are within normal limits. Kidneys demonstrate a normal enhancement pattern bilaterally. No renal calculi are seen. No obstructive changes are noted. The bladder is decompressed. Stomach/Bowel: No obstructive or inflammatory changes of the colon are seen. The appendix is within normal limits. Small bowel is within normal limits. Postsurgical changes are noted in the stomach consistent with the given clinical history of bariatric surgery. No obstructive changes are seen. Vascular/Lymphatic: No significant vascular findings are present. No enlarged abdominal or pelvic lymph nodes. Reproductive: Uterus is within normal limits. A large simple appearing cyst is noted posterior to the uterus measuring up to 7 cm. This appears to arise from the left adnexa as the right ovary is well visualized. Other: No abdominal wall hernia or abnormality. No abdominopelvic ascites. Musculoskeletal: No acute or significant osseous findings.  IMPRESSION: Changes of bariatric surgery.  No acute abnormality is noted. 7 mm simple appearing left adnexal cyst. Follow-up by [place] is recommended in 3-6 months. Note: This recommendation does not apply to premenarchal patients and to those with increased risk (genetic, family history, elevated tumor markers or other high-risk factors) of ovarian cancer. Reference: JACR 2020 Feb; 17(2):248-254 Cholelithiasis without complicating factors. Electronically Signed   By: Alcide Clever M.D.   On: 07/13/2021 02:39    Procedures Procedures    Medications Ordered in ED Medications  sodium chloride 0.9 % bolus 1,000 mL (0 mLs Intravenous Stopped 07/12/21 2242)  ondansetron (ZOFRAN) injection 4 mg (4 mg Intravenous Given 07/12/21 2114)  fentaNYL (SUBLIMAZE) injection 50 mcg (50 mcg Intravenous Given 07/12/21 2124)  alum & mag hydroxide-simeth (MAALOX/MYLANTA) 200-200-20 MG/5ML suspension 30 mL (30 mLs Oral Given 07/12/21 2242)  dicyclomine (BENTYL) capsule 10 mg (10 mg Oral Given 07/12/21 2348)  metoCLOPramide (REGLAN) injection 10 mg (10 mg Intravenous Given  07/13/21 0207)  iohexol (OMNIPAQUE) 300 MG/ML solution 100 mL (100 mLs Intravenous Contrast Given 07/13/21 0225)    ED Course/ Medical Decision Making/ A&P Clinical Course as of 07/13/21 1302  Fri Jul 12, 2021  2242 CBC with Differential(!) There is evidence of leukocytosis. [CF]  99991111 Basic metabolic panel(!) Mildly elevated glucose with no significant abnormalities. [CF]  2242 Lipase, blood Normal. [CF]  2242 Hepatic function panel Normal. [CF]  2242 On repeat evaluation, patient states that she is feeling little better but still having a lot of epigastric abdominal pain.  We will try GI cocktail. [CF]  2332 On reevaluation, patient still has not had any vomiting and passed p.o. challenges.  Patient still having epigastric abdominal pain which she describes as sharp. She does have a history of gall stones and is concerned about her gallbladder which she did  not elude to in the beginning of the interview.  We will further evaluate with a CT abdomen pelvis with contrast. [CF]    Clinical Course User Index [CF] Hendricks Limes, PA-C                           Medical Decision Making Alexis Montgomery is a 31 y.o. female patient presents the [facility] apartment today for further evaluation of epigastric abdominal pain, nausea, vomiting, and diarrhea.  This could be a complication of her gastric bypass that she does mention that she gets these episodes from time to time.  This could also be a viral gastroenteritis.  Other things on the differential include gastritis, PUD, reflux, pancreatitis.  Have added on hepatic function panel, lipase in addition to fluids and Zofran.  We will plan to reassess.   Amount and/or Complexity of Data Reviewed Labs: ordered. Decision-making details documented in ED Course. Radiology: ordered.  Risk OTC drugs. Prescription drug management. Risk Details: CT abdomen pelvis with contrast is still pending.  If this is negative, I do feel the patient will be ready for discharge.  Patient amenable to this plan.  Patient still having epigastric pain despite pain medication and GI cocktail which prompted the CT scan.  Due to shift change, patient will be transferred to Dr. Waverly Ferrari where ultimate disposition will be made pending CT results.    Final Clinical Impression(s) / ED Diagnoses Final diagnoses:  Generalized abdominal pain  Cyst of left ovary    Rx / DC Orders ED Discharge Orders     None         Cherrie Gauze 07/13/21 1302    Teressa Lower, MD 07/14/21 [phone]

## 2021-07-13 ENCOUNTER — Emergency Department (HOSPITAL_COMMUNITY): Payer: Medicaid Other

## 2021-07-13 MED ORDER — IOHEXOL 9 MG/ML PO SOLN
ORAL | Status: AC
  Filled 2021-07-13: qty 1000

## 2021-07-13 MED ORDER — METOCLOPRAMIDE HCL 5 MG/ML IJ SOLN
10.0000 mg | Freq: Once | INTRAMUSCULAR | Status: AC
  Administered 2021-07-13: 10 mg via INTRAVENOUS
  Filled 2021-07-13: qty 2

## 2021-07-13 MED ORDER — IOHEXOL 300 MG/ML  SOLN
100.0000 mL | Freq: Once | INTRAMUSCULAR | Status: AC | PRN
  Administered 2021-07-13: 100 mL via INTRAVENOUS

## 2021-07-13 NOTE — Discharge Instructions (Signed)
You need to follow-up with your OB/GYN to have your ovarian cyst rechecked in the next 3 to 6 months.

## 2021-07-13 NOTE — ED Notes (Signed)
Pt can only drink 1 bottle of iohexol oral solution. Pt ambulated to the bathroom unassisted

## 2021-07-13 NOTE — ED Notes (Signed)
Pt was in a hurry to leave due to home issues. Pt could not wait to have parting vs taken.

## 2021-07-15 ENCOUNTER — Encounter: Payer: Self-pay | Admitting: Adult Health

## 2021-07-15 ENCOUNTER — Other Ambulatory Visit (HOSPITAL_COMMUNITY)
Admission: RE | Admit: 2021-07-15 | Discharge: 2021-07-15 | Disposition: A | Discharge status: Home / Self Care | Payer: Medicaid Other | Source: Ambulatory Visit | Attending: Adult Health | Admitting: Adult Health

## 2021-07-15 ENCOUNTER — Inpatient Hospital Stay (HOSPITAL_COMMUNITY): Payer: Medicaid Other

## 2021-07-15 ENCOUNTER — Ambulatory Visit: Payer: Medicaid Other | Admitting: Adult Health

## 2021-07-15 VITALS — BP 125/86 | HR 74 | Ht 62.0 in | Wt 178.0 lb

## 2021-07-15 DIAGNOSIS — R112 Nausea with vomiting, unspecified: Secondary | ICD-10-CM | POA: Diagnosis not present

## 2021-07-15 DIAGNOSIS — R1011 Right upper quadrant pain: Secondary | ICD-10-CM

## 2021-07-15 DIAGNOSIS — Z124 Encounter for screening for malignant neoplasm of cervix: Secondary | ICD-10-CM

## 2021-07-15 DIAGNOSIS — N83202 Unspecified ovarian cyst, left side: Secondary | ICD-10-CM

## 2021-07-15 DIAGNOSIS — Z975 Presence of (intrauterine) contraceptive device: Secondary | ICD-10-CM

## 2021-07-15 MED ORDER — PROMETHAZINE HCL 25 MG PO TABS: 25.0000 mg | ORAL_TABLET | Freq: Four times a day (QID) | ORAL | 1 refills | Status: AC | PRN

## 2021-07-15 NOTE — Progress Notes (Addendum)
Subjective:     Patient ID: Alexis Montgomery, female   DOB: [birth year], 31 y.o.   MRN: [id number]  HPI Alexis Montgomery is a 31 year old white female, separated, [id number] in for ER Folow up was seen 07/12/21 for nausea,vomiting and diarrhea with abdominal pain. She had CT, has cyst left ovary and gallstones, still has pain and nausea. TD:DUKGURKYHCWC: Uterus is within normal limits. A large simple appearing cyst is noted posterior to the uterus measuring up to 7 cm. This appears to arise from the left adnexa as the right ovary is well visualized.  Hepatobiliary: Liver is well visualized and within normal limits. Gallbladder is well distended with cholelithiasis. No wall thickening or pericholecystic fluid is noted.  IMPRESSION: Changes of bariatric surgery.  No acute abnormality is noted.   7 mm simple appearing left adnexal cyst. Follow-up by [place] is recommended in 3-6 months. Note: This recommendation does not apply to premenarchal patients and to those with increased risk (genetic, family history, elevated tumor markers or other high-risk factors) of ovarian cancer. Reference: JACR 2020 Feb; 17(2):248-254   Cholelithiasis without complicating factors.  She needs a pap. PCP is Lianne Moris PA.  Review of Systems Pain in abdomen esp RUQ +nausea and vomiting  Reviewed past medical,surgical, social and family history. Reviewed medications and allergies.     Objective:   Physical Exam BP 125/86 (BP Location: Left Arm, Patient Position: Sitting, Cuff Size: Normal)   Pulse 74   Ht 5\' 2"  (1.575 m)   Wt 178 lb (80.7 kg)   BMI 32.56 kg/m     Skin warm and dry. On abdomen + tenderness RUQ, and epigastric area. Pelvic: external genitalia is normal in appearance no lesions, vagina: white discharge without odor,urethra has no lesions or masses noted, cervix:smooth and bulbous,Pap with HR HPV genotyping performed, uterus: normal size, shape and contour, non tender, no masses felt, adnexa: no masses or  tenderness noted. Bladder is non tender and no masses felt. Fall risk is low  Upstream - 07/15/21 1043       Pregnancy Intention Screening   Does the patient want to become pregnant in the next year? No    Does the patient's partner want to become pregnant in the next year? No    Would the patient like to discuss contraceptive options today? No      Contraception Wrap Up   Current Method Hormonal Implant    End Method Hormonal Implant            Examination chaperoned by 09/15/21 LPN  Assessment:     1. Routine Papanicolaou smear Pap sent Pap in 3 years if normal - Cytology - PAP( [facility])  2. Cyst of left ovary Will get pelvic Malachy Mood to assess size better  - US PELVIC COMPLETE WITH TRANSVAGINAL; Future  3. RUQ pain Eat balnd Will get [place] 07/19/21 at [facility] at 10:30 am - [facility] Abdomen Complete; Future Take ES tyelnol No heating pad  4. Nausea and vomiting, unspecified vomiting type Will rx phenergan Meds ordered this encounter  Medications   promethazine (PHENERGAN) 25 MG tablet    Sig: Take 1 tablet (25 mg total) by mouth every 6 (six) hours as needed for nausea or vomiting.    Dispense:  30 tablet    Refill:  1    Order Specific Question:   Supervising Provider    Answer:   [place], LUTHER H [[id number]]     5. Nexplanon in place Was placed 07/05/18  after delivery    Plan:     Call me in am for up date   Return in 2 weeks to replace nexplanon

## 2021-07-17 ENCOUNTER — Encounter (HOSPITAL_COMMUNITY): Payer: Self-pay | Admitting: Hematology

## 2021-07-18 LAB — CYTOLOGY - PAP
Comment: NEGATIVE
Diagnosis: NEGATIVE
High risk HPV: NEGATIVE

## 2021-07-19 ENCOUNTER — Ambulatory Visit (HOSPITAL_COMMUNITY): Payer: Medicaid Other

## 2021-07-24 ENCOUNTER — Inpatient Hospital Stay (HOSPITAL_COMMUNITY): Payer: Medicaid Other | Attending: Physician Assistant

## 2021-07-29 ENCOUNTER — Other Ambulatory Visit: Payer: Self-pay | Admitting: *Deleted

## 2021-07-29 ENCOUNTER — Encounter: Payer: Self-pay | Admitting: Adult Health

## 2021-07-29 ENCOUNTER — Ambulatory Visit: Payer: Medicaid Other | Admitting: Adult Health

## 2021-07-29 VITALS — BP 123/84 | HR 81 | Ht 62.0 in | Wt 175.5 lb

## 2021-07-29 DIAGNOSIS — Z3202 Encounter for pregnancy test, result negative: Secondary | ICD-10-CM | POA: Diagnosis not present

## 2021-07-29 DIAGNOSIS — N83202 Unspecified ovarian cyst, left side: Secondary | ICD-10-CM

## 2021-07-29 DIAGNOSIS — Z3046 Encounter for surveillance of implantable subdermal contraceptive: Secondary | ICD-10-CM | POA: Diagnosis not present

## 2021-07-29 DIAGNOSIS — K810 Acute cholecystitis: Secondary | ICD-10-CM

## 2021-07-29 DIAGNOSIS — R1011 Right upper quadrant pain: Secondary | ICD-10-CM

## 2021-07-29 LAB — POCT URINE PREGNANCY: Preg Test, Ur: NEGATIVE

## 2021-07-29 MED ORDER — ETONOGESTREL 68 MG ~~LOC~~ IMPL
68.0000 mg | DRUG_IMPLANT | Freq: Once | SUBCUTANEOUS | Status: AC
  Administered 2021-07-29: 68 mg via SUBCUTANEOUS

## 2021-07-29 NOTE — Progress Notes (Signed)
  Subjective:     Patient ID: Alexis Montgomery, female   DOB: November 14, 1990, 31 y.o.   MRN: [id number]  HPI Alexis Montgomery is a 31 year old white female, separated, [id number] in for nexplanon removal and reinsertion. Lab Results  Component Value Date   DIAGPAP  07/15/2021    - Negative for intraepithelial lesion or malignancy (NILM)   HPVHIGH Negative 07/15/2021   PCP is Lianne Moris PA.  Review of Systems For nexplanon removal and reinsertion Reviewed past medical,surgical, social and family history. Reviewed medications and allergies.     Objective:   Physical Exam BP 123/84 (BP Location: Right Arm, Patient Position: Sitting, Cuff Size: Normal)   Pulse 81   Ht 5\' 2"  (1.575 m)   Wt 175 lb 8 oz (79.6 kg)   LMP 07/16/2021   BMI 32.10 kg/m  UPT is negative. Consent signed and time out called. Left arm cleansed with betadine, and injected with 1.5 cc 2% lidocaine and waited til numb.Under sterile technique a #11 blade was used to make small vertical incision, and a curved forceps was used to easily remove rod. New rod easily inserted and palpated by provider and pt.Steri strips applied. Pressure dressing applied.      Upstream - 07/29/21 1509       Pregnancy Intention Screening   Does the patient want to become pregnant in the next year? No    Does the patient's partner want to become pregnant in the next year? No    Would the patient like to discuss contraceptive options today? No      Contraception Wrap Up   Current Method Hormonal Implant    End Method Hormonal Implant             Assessment:     1. Pregnancy examination or test, negative result  2. Encounter for removal and reinsertion of Nexplanon Lot 07/31/21 Exp [id number]  3. Cyst of left ovary Did not get [id number] 07/19/21 rescheduled to 08/05/21 at 3:30 at [facility] to assess ovaries Will talk when results back  4. RUQ pain Had SU at PCP Seeing Dr [facility] 07/31/21       Plan:     Use condoms x 2 weeks, keep clean and dry x 24  hours, no heavy lifting, keep steri strips on x 72 hours, Keep pressure dressing on x 24 hours. Follow up prn problems.

## 2021-07-29 NOTE — Addendum Note (Signed)
Addended by: Colen Darling on: 07/29/2021 04:55 PM   Modules accepted: Orders

## 2021-07-29 NOTE — Patient Instructions (Signed)
Use condoms x 2 weeks, keep clean and dry x 24 hours, no heavy lifting, keep steri strips on x 72 hours, Keep pressure dressing on x 24 hours. Follow up prn problems.  

## 2021-07-31 ENCOUNTER — Encounter: Payer: Self-pay | Admitting: General Surgery

## 2021-07-31 ENCOUNTER — Ambulatory Visit: Payer: Medicaid Other | Admitting: General Surgery

## 2021-07-31 VITALS — BP 120/89 | HR 106 | Temp 98.5°F | Resp 14 | Ht 62.0 in | Wt 177.0 lb

## 2021-07-31 DIAGNOSIS — K8020 Calculus of gallbladder without cholecystitis without obstruction: Secondary | ICD-10-CM

## 2021-08-01 NOTE — H&P (Signed)
Alexis Montgomery; [id number]; 10/28/1990   HPI Patient is a 31 year old white female who was referred to my care by Lianne Moris for evaluation and treatment of biliary colic secondary to cholelithiasis.  Patient has had multiple episodes of right upper quadrant abdominal pain, nausea, and vomiting.  This has been occurring over the past few months.  She did have an ultrasound done as an outpatient which revealed cholelithiasis with a dilated common bile duct 10 mm.  Reassuringly, she had LFTs done on 07/12/2021 which were within normal limits.  She currently is asymptomatic.  She is concerned that she may have recurrence of her symptoms requiring an ER visit.  She denies any fever, chills, or jaundice.  She has had bariatric surgery in the past, and was told at that time that she did have gallstones. Past Medical History:  Diagnosis Date   Anemia    Benign essential HTN 10/09/2014   Hidradenitis suppurativa 10/09/2014   Menorrhagia with irregular cycle 06/11/2015   Morbid obesity (HCC) 10/09/2014   MRSA infection 10/09/2014   Ovarian cyst    Pregnancy induced hypertension    Tachycardia     Past Surgical History:  Procedure Laterality Date   CESAREAN SECTION N/A 04/23/2012   Procedure: CESAREAN SECTION;  Surgeon: Tereso Newcomer, MD;  Location: [facility] ORS;  Service: Obstetrics;  Laterality: N/A;   CESAREAN SECTION N/A 07/04/2018   Procedure: CESAREAN SECTION;  Surgeon: Conan Bowens, MD;  Location: [facility] LD ORS;  Service: Obstetrics;  Laterality: N/A;   DILATION AND CURETTAGE OF UTERUS N/A 07/07/2016   Procedure: SUCTION DILATATION AND CURETTAGE;  Surgeon: Tilda Burrow, MD;  Location: [facility] ORS;  Service: Gynecology;  Laterality: N/A;   FOOT SURGERY Left 2020   LAPAROSCOPIC ROUX-EN-Y GASTRIC BYPASS WITH UPPER ENDOSCOPY AND REMOVAL OF LAP BAND  06/08/2017    Family History  Problem Relation Age of Onset   Heart Problems Paternal Grandfather    HIV Mother    Cancer Mother    Diabetes Paternal  Uncle     Current Outpatient Medications on File Prior to Visit  Medication Sig Dispense Refill   acetaminophen (TYLENOL) 500 MG tablet Take by mouth.     etonogestrel (NEXPLANON) 68 MG IMPL implant 1 each by Subdermal route once.     glucose blood (ACCU-CHEK GUIDE) test strip Use as instructed to monitor glucose 4 times daily 100 each 0   omeprazole (PRILOSEC) 20 MG capsule TAKE ONE CAPSULE (20MG  TOTAL) BY MOUTH DAILY (Patient taking differently: Take 20 mg by mouth daily as needed (acid reflux).) 30 capsule 6   promethazine (PHENERGAN) 25 MG tablet Take 1 tablet (25 mg total) by mouth every 6 (six) hours as needed for nausea or vomiting. 30 tablet 1   Vitamin D, Ergocalciferol, (DRISDOL) 1.25 MG (50000 UNIT) CAPS capsule Take 50,000 Units by mouth once a week.     OXYCODONE-ACETAMINOPHEN PO Take by mouth. This was left over from foot surgery in 2020 (Patient not taking: Reported on 07/29/2021)     No current facility-administered medications on file prior to visit.    Allergies  Allergen Reactions   Sulfa Antibiotics Hives    Social History   Substance and Sexual Activity  Alcohol Use No   Alcohol/week: 0.0 standard drinks of alcohol    Social History   Tobacco Use  Smoking Status Never  Smokeless Tobacco Never    Review of Systems  Constitutional: Negative.   HENT: Negative.    Eyes: Negative.  Respiratory: Negative.    Cardiovascular: Negative.   Gastrointestinal:  Positive for abdominal pain and heartburn.  Genitourinary: Negative.   Musculoskeletal: Negative.   Skin: Negative.   Neurological: Negative.   Endo/Heme/Allergies: Negative.   Psychiatric/Behavioral: Negative.      Objective   Vitals:   07/31/21 1500  BP: 120/89  Pulse: (!) 106  Resp: 14  Temp: 98.5 F (36.9 C)  SpO2: 99%    Physical Exam Vitals reviewed.  Constitutional:      Appearance: Normal appearance. She is not ill-appearing.  HENT:     Head: Normocephalic and atraumatic.   Cardiovascular:     Rate and Rhythm: Normal rate and regular rhythm.     Heart sounds: Normal heart sounds. No murmur heard.    No friction rub. No gallop.  Pulmonary:     Effort: Pulmonary effort is normal. No respiratory distress.     Breath sounds: Normal breath sounds. No stridor. No wheezing, rhonchi or rales.  Abdominal:     General: Bowel sounds are normal. There is no distension.     Palpations: Abdomen is soft. There is no mass.     Tenderness: There is no abdominal tenderness. There is no guarding or rebound.     Hernia: No hernia is present.  Skin:    General: Skin is warm and dry.  Neurological:     Mental Status: She is alert and oriented to person, place, and time.     Assessment  Biliary colic secondary to cholelithiasis Plan  Patient is scheduled for laparoscopic cholecystectomy with intraoperative cholangiograms on 08/12/2021.  The risks and benefits of the procedure including bleeding, infection, hepatobiliary injury, the possibility of an open procedure were fully explained to the patient, who gave informed consent.

## 2021-08-01 NOTE — Progress Notes (Signed)
Alexis Montgomery; [id number]; 03/30/1990   HPI Patient is a 30-year-old white female who was referred to my care by Erin Carroll for evaluation and treatment of biliary colic secondary to cholelithiasis.  Patient has had multiple episodes of right upper quadrant abdominal pain, nausea, and vomiting.  This has been occurring over the past few months.  She did have an ultrasound done as an outpatient which revealed cholelithiasis with a dilated common bile duct 10 mm.  Reassuringly, she had LFTs done on 07/12/2021 which were within normal limits.  She currently is asymptomatic.  She is concerned that she may have recurrence of her symptoms requiring an ER visit.  She denies any fever, chills, or jaundice.  She has had bariatric surgery in the past, and was told at that time that she did have gallstones. Past Medical History:  Diagnosis Date   Anemia    Benign essential HTN 10/09/2014   Hidradenitis suppurativa 10/09/2014   Menorrhagia with irregular cycle 06/11/2015   Morbid obesity (HCC) 10/09/2014   MRSA infection 10/09/2014   Ovarian cyst    Pregnancy induced hypertension    Tachycardia     Past Surgical History:  Procedure Laterality Date   CESAREAN SECTION N/A 04/23/2012   Procedure: CESAREAN SECTION;  Surgeon: Ugonna A Anyanwu, MD;  Location: [facility];  Service: Obstetrics;  Laterality: N/A;   CESAREAN SECTION N/A 07/04/2018   Procedure: CESAREAN SECTION;  Surgeon: Davis, Kelly M, MD;  Location: [facility];  Service: Obstetrics;  Laterality: N/A;   DILATION AND CURETTAGE OF UTERUS N/A 07/07/2016   Procedure: SUCTION DILATATION AND CURETTAGE;  Surgeon: Ferguson, John V, MD;  Location: [facility];  Service: Gynecology;  Laterality: N/A;   FOOT SURGERY Left 2020   LAPAROSCOPIC ROUX-EN-Y GASTRIC BYPASS WITH UPPER ENDOSCOPY AND REMOVAL OF LAP BAND  06/08/2017    Family History  Problem Relation Age of Onset   Heart Problems Paternal Grandfather    HIV Mother    Cancer Mother    Diabetes Paternal  Uncle     Current Outpatient Medications on File Prior to Visit  Medication Sig Dispense Refill   acetaminophen (TYLENOL) 500 MG tablet Take by mouth.     etonogestrel (NEXPLANON) 68 MG IMPL implant 1 each by Subdermal route once.     glucose blood (ACCU-CHEK GUIDE) test strip Use as instructed to monitor glucose 4 times daily 100 each 0   omeprazole (PRILOSEC) 20 MG capsule TAKE ONE CAPSULE (20MG TOTAL) BY MOUTH DAILY (Patient taking differently: Take 20 mg by mouth daily as needed (acid reflux).) 30 capsule 6   promethazine (PHENERGAN) 25 MG tablet Take 1 tablet (25 mg total) by mouth every 6 (six) hours as needed for nausea or vomiting. 30 tablet 1   Vitamin D, Ergocalciferol, (DRISDOL) 1.25 MG (50000 UNIT) CAPS capsule Take 50,000 Units by mouth once a week.     OXYCODONE-ACETAMINOPHEN PO Take by mouth. This was left over from foot surgery in 2020 (Patient not taking: Reported on 07/29/2021)     No current facility-administered medications on file prior to visit.    Allergies  Allergen Reactions   Sulfa Antibiotics Hives    Social History   Substance and Sexual Activity  Alcohol Use No   Alcohol/week: 0.0 standard drinks of alcohol    Social History   Tobacco Use  Smoking Status Never  Smokeless Tobacco Never    Review of Systems  Constitutional: Negative.   HENT: Negative.    Eyes: Negative.     Respiratory: Negative.    Cardiovascular: Negative.   Gastrointestinal:  Positive for abdominal pain and heartburn.  Genitourinary: Negative.   Musculoskeletal: Negative.   Skin: Negative.   Neurological: Negative.   Endo/Heme/Allergies: Negative.   Psychiatric/Behavioral: Negative.      Objective   Vitals:   07/31/21 1500  BP: 120/89  Pulse: (!) 106  Resp: 14  Temp: 98.5 F (36.9 C)  SpO2: 99%    Physical Exam Vitals reviewed.  Constitutional:      Appearance: Normal appearance. She is not ill-appearing.  HENT:     Head: Normocephalic and atraumatic.   Cardiovascular:     Rate and Rhythm: Normal rate and regular rhythm.     Heart sounds: Normal heart sounds. No murmur heard.    No friction rub. No gallop.  Pulmonary:     Effort: Pulmonary effort is normal. No respiratory distress.     Breath sounds: Normal breath sounds. No stridor. No wheezing, rhonchi or rales.  Abdominal:     General: Bowel sounds are normal. There is no distension.     Palpations: Abdomen is soft. There is no mass.     Tenderness: There is no abdominal tenderness. There is no guarding or rebound.     Hernia: No hernia is present.  Skin:    General: Skin is warm and dry.  Neurological:     Mental Status: She is alert and oriented to person, place, and time.     Assessment  Biliary colic secondary to cholelithiasis Plan  Patient is scheduled for laparoscopic cholecystectomy with intraoperative cholangiograms on 08/12/2021.  The risks and benefits of the procedure including bleeding, infection, hepatobiliary injury, the possibility of an open procedure were fully explained to the patient, who gave informed consent.

## 2021-08-05 ENCOUNTER — Ambulatory Visit (HOSPITAL_COMMUNITY)
Admission: RE | Admit: 2021-08-05 | Discharge: 2021-08-05 | Disposition: A | Discharge status: Home / Self Care | Payer: Medicaid Other | Source: Ambulatory Visit | Attending: Adult Health | Admitting: Adult Health

## 2021-08-05 DIAGNOSIS — N83202 Unspecified ovarian cyst, left side: Secondary | ICD-10-CM | POA: Diagnosis not present

## 2021-08-06 ENCOUNTER — Telehealth: Payer: Self-pay | Admitting: Adult Health

## 2021-08-06 NOTE — Telephone Encounter (Signed)
Opened in error

## 2021-08-07 NOTE — Patient Instructions (Signed)
Alexis Montgomery  08/07/2021     @PREFPERIOPPHARMACY @   Your procedure is scheduled on  08/12/2021.   [facility] at  0840  A.M.   Call this number if you have problems the morning of surgery:  [phone]   Remember:  Do not eat or drink after midnight.      Take these medicines the morning of surgery with A SIP OF WATER                                         prilosec.     Do not wear jewelry, make-up or nail polish.  Do not wear lotions, powders, or perfumes, or deodorant.  Do not shave 48 hours prior to surgery.  Men may shave face and neck.  Do not bring valuables to the hospital.  [facility] not responsible for any belongings or valuables.  Contacts, dentures or bridgework may not be worn into surgery.  Leave your suitcase in the car.  After surgery it may be brought to your room.  For patients admitted to the hospital, discharge time will be determined by your treatment team.  Patients discharged the day of surgery will not be allowed to drive home and must have someone with them for 24 hours.    Special instructions:   DO NOT smoke tobacco or vape for 24 hours before your procedure.  Please read over the following fact sheets that you were given. Coughing and Deep Breathing, Surgical Site Infection Prevention, Anesthesia Post-op Instructions, and Care and Recovery After Surgery      Minimally Invasive Cholecystectomy, Care After The following information offers guidance on how to care for yourself after your procedure. Your health care provider may also give you more specific instructions. If you have problems or questions, contact your health care provider. What can I expect after the procedure? After the procedure, it is common to have: Pain at your incision sites. You will be given medicines to control this pain. Mild nausea or vomiting. Bloating and possible shoulder pain from the gas that was used during the procedure. Follow these  instructions at home: Medicines Take over-the-counter and prescription medicines only as told by your health care provider. If you were prescribed an antibiotic medicine, take it as told by your health care provider. Do not stop using the antibiotic even if you start to feel better. Ask your health care provider if the medicine prescribed to you: Requires you to avoid driving or using machinery. Can cause constipation. You may need to take these actions to prevent or treat constipation: Drink enough fluid to keep your urine pale yellow. Take over-the-counter or prescription medicines. Eat foods that are high in fiber, such as beans, whole grains, and fresh fruits and vegetables. Limit foods that are high in fat and processed sugars, such as fried or sweet foods. Incision care  Follow instructions from your health care provider about how to take care of your incisions. Make sure you: Wash your hands with soap and water for at least 20 seconds before and after you change your bandage (dressing). If soap and water are not available, use hand sanitizer. Change your dressing as told by your health care provider. Leave stitches (sutures), skin glue, or adhesive strips in place. These skin closures may need to be in place for 2 weeks  or longer. If adhesive strip edges start to loosen and curl up, you may trim the loose edges. Do not remove adhesive strips completely unless your health care provider tells you to do that. Do not take baths, swim, or use a hot tub until your health care provider approves. Ask your health care provider if you may take showers. You may only be allowed to take sponge baths. Check your incision area every day for signs of infection. Check for: More redness, swelling, or pain. Fluid or blood. Warmth. Pus or a bad smell. Activity Rest as told by your health care provider. Do not do activities that require a lot of effort. Avoid sitting for a long time without moving. Get up  to take short walks every 1-2 hours. This is important to improve blood flow and breathing. Ask for help if you feel weak or unsteady. Do not lift anything that is heavier than 10 lb (4.5 kg), or the limit that you are told, until your health care provider says that it is safe. Do not play contact sports until your health care provider approves. Do not return to work or school until your health care provider approves. Return to your normal activities as told by your health care provider. Ask your health care provider what activities are safe for you. General instructions If you were given a sedative during the procedure, it can affect you for several hours. Do not drive or operate machinery until your health care provider says that it is safe. Keep all follow-up visits. This is important. Contact a health care provider if: You develop a rash. You have more redness, swelling, or pain around your incisions. You have fluid or blood coming from your incisions. Your incisions feel warm to the touch. You have pus or a bad smell coming from your incisions. You have a fever. One or more of your incisions breaks open. Get help right away if: You have trouble breathing. You have chest pain. You have more pain in your shoulders. You faint or feel dizzy when you stand. You have severe pain in your abdomen. You have nausea or vomiting that lasts for more than one day. You have leg pain that is new or unusual, or if it is localized to one specific spot. These symptoms may represent a serious problem that is an emergency. Do not wait to see if the symptoms will go away. Get medical help right away. Call your local emergency services (911 in the [place] Do not drive yourself to the hospital. Summary After your procedure, it is common to have pain at the incision sites. You may also have nausea or bloating. Follow your health care provider's instructions about medicine, activity restrictions, and caring for  your incision areas. Do not do activities that require a lot of effort. Contact a health care provider if you have a fever or other signs of infection, such as more redness, swelling, or pain around the incisions. Get help right away if you have chest pain, increasing pain in the shoulders, or trouble breathing. This information is not intended to replace advice given to you by your health care provider. Make sure you discuss any questions you have with your health care provider. Document Revised: 06/26/2020 Document Reviewed: 06/26/2020 Elsevier Patient Education  2023 [employer] General Anesthesia, Adult, Care After This sheet gives you information about how to care for yourself after your procedure. Your health care provider may also give you more specific instructions. If you have problems  or questions, contact your health care provider. What can I expect after the procedure? After the procedure, the following side effects are common: Pain or discomfort at the IV site. Nausea. Vomiting. Sore throat. Trouble concentrating. Feeling cold or chills. Feeling weak or tired. Sleepiness and fatigue. Soreness and body aches. These side effects can affect parts of the body that were not involved in surgery. Follow these instructions at home: For the time period you were told by your health care provider:  Rest. Do not participate in activities where you could fall or become injured. Do not drive or use machinery. Do not drink alcohol. Do not take sleeping pills or medicines that cause drowsiness. Do not make important decisions or sign legal documents. Do not take care of children on your own. Eating and drinking Follow any instructions from your health care provider about eating or drinking restrictions. When you feel hungry, start by eating small amounts of foods that are soft and easy to digest (bland), such as toast. Gradually return to your regular diet. Drink enough fluid to keep  your urine pale yellow. If you vomit, rehydrate by drinking water, juice, or clear broth. General instructions If you have sleep apnea, surgery and certain medicines can increase your risk for breathing problems. Follow instructions from your health care provider about wearing your sleep device: Anytime you are sleeping, including during daytime naps. While taking prescription pain medicines, sleeping medicines, or medicines that make you drowsy. Have a responsible adult stay with you for the time you are told. It is important to have someone help care for you until you are awake and alert. Return to your normal activities as told by your health care provider. Ask your health care provider what activities are safe for you. Take over-the-counter and prescription medicines only as told by your health care provider. If you smoke, do not smoke without supervision. Keep all follow-up visits as told by your health care provider. This is important. Contact a health care provider if: You have nausea or vomiting that does not get better with medicine. You cannot eat or drink without vomiting. You have pain that does not get better with medicine. You are unable to pass urine. You develop a skin rash. You have a fever. You have redness around your IV site that gets worse. Get help right away if: You have difficulty breathing. You have chest pain. You have blood in your urine or stool, or you vomit blood. Summary After the procedure, it is common to have a sore throat or nausea. It is also common to feel tired. Have a responsible adult stay with you for the time you are told. It is important to have someone help care for you until you are awake and alert. When you feel hungry, start by eating small amounts of foods that are soft and easy to digest (bland), such as toast. Gradually return to your regular diet. Drink enough fluid to keep your urine pale yellow. Return to your normal activities as told  by your health care provider. Ask your health care provider what activities are safe for you. This information is not intended to replace advice given to you by your health care provider. Make sure you discuss any questions you have with your health care provider. Document Revised: 09/08/2019 Document Reviewed: 04/07/2019 Elsevier Patient Education  2023 [employer] How to Use Chlorhexidine for Bathing Chlorhexidine gluconate (CHG) is a germ-killing (antiseptic) solution that is used to clean the skin. It can get  rid of the bacteria that normally live on the skin and can keep them away for about 24 hours. To clean your skin with CHG, you may be given: A CHG solution to use in the shower or as part of a sponge bath. A prepackaged cloth that contains CHG. Cleaning your skin with CHG may help lower the risk for infection: While you are staying in the intensive care unit of the hospital. If you have a vascular access, such as a central line, to provide short-term or long-term access to your veins. If you have a catheter to drain urine from your bladder. If you are on a ventilator. A ventilator is a machine that helps you breathe by moving air in and out of your lungs. After surgery. What are the risks? Risks of using CHG include: A skin reaction. Hearing loss, if CHG gets in your ears and you have a perforated eardrum. Eye injury, if CHG gets in your eyes and is not rinsed out. The CHG product catching fire. Make sure that you avoid smoking and flames after applying CHG to your skin. Do not use CHG: If you have a chlorhexidine allergy or have previously reacted to chlorhexidine. On babies younger than 53 months of age. How to use CHG solution Use CHG only as told by your health care provider, and follow the instructions on the label. Use the full amount of CHG as directed. Usually, this is one bottle. During a shower Follow these steps when using CHG solution during a shower (unless your  health care provider gives you different instructions): Start the shower. Use your normal soap and shampoo to wash your face and hair. Turn off the shower or move out of the shower stream. Pour the CHG onto a clean washcloth. Do not use any type of brush or rough-edged sponge. Starting at your neck, lather your body down to your toes. Make sure you follow these instructions: If you will be having surgery, pay special attention to the part of your body where you will be having surgery. Scrub this area for at least 1 minute. Do not use CHG on your head or face. If the solution gets into your ears or eyes, rinse them well with water. Avoid your genital area. Avoid any areas of skin that have broken skin, cuts, or scrapes. Scrub your back and under your arms. Make sure to wash skin folds. Let the lather sit on your skin for 1-2 minutes or as long as told by your health care provider. Thoroughly rinse your entire body in the shower. Make sure that all body creases and crevices are rinsed well. Dry off with a clean towel. Do not put any substances on your body afterward--such as powder, lotion, or perfume--unless you are told to do so by your health care provider. Only use lotions that are recommended by the manufacturer. Put on clean clothes or pajamas. If it is the night before your surgery, sleep in clean sheets.  During a sponge bath Follow these steps when using CHG solution during a sponge bath (unless your health care provider gives you different instructions): Use your normal soap and shampoo to wash your face and hair. Pour the CHG onto a clean washcloth. Starting at your neck, lather your body down to your toes. Make sure you follow these instructions: If you will be having surgery, pay special attention to the part of your body where you will be having surgery. Scrub this area for at least 1 minute. Do  not use CHG on your head or face. If the solution gets into your ears or eyes, rinse  them well with water. Avoid your genital area. Avoid any areas of skin that have broken skin, cuts, or scrapes. Scrub your back and under your arms. Make sure to wash skin folds. Let the lather sit on your skin for 1-2 minutes or as long as told by your health care provider. Using a different clean, wet washcloth, thoroughly rinse your entire body. Make sure that all body creases and crevices are rinsed well. Dry off with a clean towel. Do not put any substances on your body afterward--such as powder, lotion, or perfume--unless you are told to do so by your health care provider. Only use lotions that are recommended by the manufacturer. Put on clean clothes or pajamas. If it is the night before your surgery, sleep in clean sheets. How to use CHG prepackaged cloths Only use CHG cloths as told by your health care provider, and follow the instructions on the label. Use the CHG cloth on clean, dry skin. Do not use the CHG cloth on your head or face unless your health care provider tells you to. When washing with the CHG cloth: Avoid your genital area. Avoid any areas of skin that have broken skin, cuts, or scrapes. Before surgery Follow these steps when using a CHG cloth to clean before surgery (unless your health care provider gives you different instructions): Using the CHG cloth, vigorously scrub the part of your body where you will be having surgery. Scrub using a back-and-forth motion for 3 minutes. The area on your body should be completely wet with CHG when you are done scrubbing. Do not rinse. Discard the cloth and let the area air-dry. Do not put any substances on the area afterward, such as powder, lotion, or perfume. Put on clean clothes or pajamas. If it is the night before your surgery, sleep in clean sheets.  For general bathing Follow these steps when using CHG cloths for general bathing (unless your health care provider gives you different instructions). Use a separate CHG cloth  for each area of your body. Make sure you wash between any folds of skin and between your fingers and toes. Wash your body in the following order, switching to a new cloth after each step: The front of your neck, shoulders, and chest. Both of your arms, under your arms, and your hands. Your stomach and groin area, avoiding the genitals. Your right leg and foot. Your left leg and foot. The back of your neck, your back, and your buttocks. Do not rinse. Discard the cloth and let the area air-dry. Do not put any substances on your body afterward--such as powder, lotion, or perfume--unless you are told to do so by your health care provider. Only use lotions that are recommended by the manufacturer. Put on clean clothes or pajamas. Contact a health care provider if: Your skin gets irritated after scrubbing. You have questions about using your solution or cloth. You swallow any chlorhexidine. Call your local poison control center (640 062 0439 in the [place] Get help right away if: Your eyes itch badly, or they become very red or swollen. Your skin itches badly and is red or swollen. Your hearing changes. You have trouble seeing. You have swelling or tingling in your mouth or throat. You have trouble breathing. These symptoms may represent a serious problem that is an emergency. Do not wait to see if the symptoms will go away. Get medical  help right away. Call your local emergency services (911 in the [place] Do not drive yourself to the hospital. Summary Chlorhexidine gluconate (CHG) is a germ-killing (antiseptic) solution that is used to clean the skin. Cleaning your skin with CHG may help to lower your risk for infection. You may be given CHG to use for bathing. It may be in a bottle or in a prepackaged cloth to use on your skin. Carefully follow your health care provider's instructions and the instructions on the product label. Do not use CHG if you have a chlorhexidine allergy. Contact your  health care provider if your skin gets irritated after scrubbing. This information is not intended to replace advice given to you by your health care provider. Make sure you discuss any questions you have with your health care provider. Document Revised: 03/05/2020 Document Reviewed: 03/05/2020 Elsevier Patient Education  2023 [employer].

## 2021-08-08 ENCOUNTER — Encounter (HOSPITAL_COMMUNITY)
Admission: RE | Admit: 2021-08-08 | Discharge: 2021-08-08 | Disposition: A | Discharge status: Home / Self Care | Payer: Medicaid Other | Source: Ambulatory Visit | Attending: General Surgery | Admitting: General Surgery

## 2021-08-08 DIAGNOSIS — Z8679 Personal history of other diseases of the circulatory system: Secondary | ICD-10-CM

## 2021-08-08 DIAGNOSIS — Z01818 Encounter for other preprocedural examination: Secondary | ICD-10-CM

## 2021-08-12 DIAGNOSIS — Z01818 Encounter for other preprocedural examination: Secondary | ICD-10-CM

## 2021-08-12 NOTE — Patient Instructions (Signed)
Alexis Montgomery  08/12/2021     @PREFPERIOPPHARMACY @   Your procedure is scheduled on  08/15/2021.   Report to [facility] at  0700  A.M.   Call this number if you have problems the morning of surgery:  [phone]   Remember:  Do not eat or drink after midnight.      Take these medicines the morning of surgery with A SIP OF WATER                                               Prilosec.     Do not wear jewelry, make-up or nail polish.  Do not wear lotions, powders, or perfumes, or deodorant.  Do not shave 48 hours prior to surgery.  Men may shave face and neck.  Do not bring valuables to the hospital.  [facility] is not responsible for any belongings or valuables.  Contacts, dentures or bridgework may not be worn into surgery.  Leave your suitcase in the car.  After surgery it may be brought to your room.  For patients admitted to the hospital, discharge time will be determined by your treatment team.  Patients discharged the day of surgery will not be allowed to drive home and must have someone with them for 24 hours.    Special instructions:   DO NOT smoke tobacco or vape for 24 hours before your procedure.  Please read over the following fact sheets that you were given. Coughing and Deep Breathing, Surgical Site Infection Prevention, Anesthesia Post-op Instructions, and Care and Recovery After Surgery      Minimally Invasive Cholecystectomy, Care After The following information offers guidance on how to care for yourself after your procedure. Your health care provider may also give you more specific instructions. If you have problems or questions, contact your health care provider. What can I expect after the procedure? After the procedure, it is common to have: Pain at your incision sites. You will be given medicines to control this pain. Mild nausea or vomiting. Bloating and possible shoulder pain from the gas that was used during the procedure. Follow  these instructions at home: Medicines Take over-the-counter and prescription medicines only as told by your health care provider. If you were prescribed an antibiotic medicine, take it as told by your health care provider. Do not stop using the antibiotic even if you start to feel better. Ask your health care provider if the medicine prescribed to you: Requires you to avoid driving or using machinery. Can cause constipation. You may need to take these actions to prevent or treat constipation: Drink enough fluid to keep your urine pale yellow. Take over-the-counter or prescription medicines. Eat foods that are high in fiber, such as beans, whole grains, and fresh fruits and vegetables. Limit foods that are high in fat and processed sugars, such as fried or sweet foods. Incision care  Follow instructions from your health care provider about how to take care of your incisions. Make sure you: Wash your hands with soap and water for at least 20 seconds before and after you change your bandage (dressing). If soap and water are not available, use hand sanitizer. Change your dressing as told by your health care provider. Leave stitches (sutures), skin glue, or adhesive strips in place. These skin closures may need to  be in place for 2 weeks or longer. If adhesive strip edges start to loosen and curl up, you may trim the loose edges. Do not remove adhesive strips completely unless your health care provider tells you to do that. Do not take baths, swim, or use a hot tub until your health care provider approves. Ask your health care provider if you may take showers. You may only be allowed to take sponge baths. Check your incision area every day for signs of infection. Check for: More redness, swelling, or pain. Fluid or blood. Warmth. Pus or a bad smell. Activity Rest as told by your health care provider. Do not do activities that require a lot of effort. Avoid sitting for a long time without moving.  Get up to take short walks every 1-2 hours. This is important to improve blood flow and breathing. Ask for help if you feel weak or unsteady. Do not lift anything that is heavier than 10 lb (4.5 kg), or the limit that you are told, until your health care provider says that it is safe. Do not play contact sports until your health care provider approves. Do not return to work or school until your health care provider approves. Return to your normal activities as told by your health care provider. Ask your health care provider what activities are safe for you. General instructions If you were given a sedative during the procedure, it can affect you for several hours. Do not drive or operate machinery until your health care provider says that it is safe. Keep all follow-up visits. This is important. Contact a health care provider if: You develop a rash. You have more redness, swelling, or pain around your incisions. You have fluid or blood coming from your incisions. Your incisions feel warm to the touch. You have pus or a bad smell coming from your incisions. You have a fever. One or more of your incisions breaks open. Get help right away if: You have trouble breathing. You have chest pain. You have more pain in your shoulders. You faint or feel dizzy when you stand. You have severe pain in your abdomen. You have nausea or vomiting that lasts for more than one day. You have leg pain that is new or unusual, or if it is localized to one specific spot. These symptoms may represent a serious problem that is an emergency. Do not wait to see if the symptoms will go away. Get medical help right away. Call your local emergency services (911 in the [place] Do not drive yourself to the hospital. Summary After your procedure, it is common to have pain at the incision sites. You may also have nausea or bloating. Follow your health care provider's instructions about medicine, activity restrictions, and  caring for your incision areas. Do not do activities that require a lot of effort. Contact a health care provider if you have a fever or other signs of infection, such as more redness, swelling, or pain around the incisions. Get help right away if you have chest pain, increasing pain in the shoulders, or trouble breathing. This information is not intended to replace advice given to you by your health care provider. Make sure you discuss any questions you have with your health care provider. Document Revised: 06/26/2020 Document Reviewed: 06/26/2020 Elsevier Patient Education  2023 [employer] General Anesthesia, Adult, Care After The following information offers guidance on how to care for yourself after your procedure. Your health care provider may also give you more  specific instructions. If you have problems or questions, contact your health care provider. What can I expect after the procedure? After the procedure, it is common for people to: Have pain or discomfort at the IV site. Have nausea or vomiting. Have a sore throat or hoarseness. Have trouble concentrating. Feel cold or chills. Feel weak, sleepy, or tired (fatigue). Have soreness and body aches. These can affect parts of the body that were not involved in surgery. Follow these instructions at home: For the time period you were told by your health care provider:  Rest. Do not participate in activities where you could fall or become injured. Do not drive or use machinery. Do not drink alcohol. Do not take sleeping pills or medicines that cause drowsiness. Do not make important decisions or sign legal documents. Do not take care of children on your own. General instructions Drink enough fluid to keep your urine pale yellow. If you have sleep apnea, surgery and certain medicines can increase your risk for breathing problems. Follow instructions from your health care provider about wearing your sleep device: Anytime you are  sleeping, including during daytime naps. While taking prescription pain medicines, sleeping medicines, or medicines that make you drowsy. Return to your normal activities as told by your health care provider. Ask your health care provider what activities are safe for you. Take over-the-counter and prescription medicines only as told by your health care provider. Do not use any products that contain nicotine or tobacco. These products include cigarettes, chewing tobacco, and vaping devices, such as e-cigarettes. These can delay incision healing after surgery. If you need help quitting, ask your health care provider. Contact a health care provider if: You have nausea or vomiting that does not get better with medicine. You vomit every time you eat or drink. You have pain that does not get better with medicine. You cannot urinate or have bloody urine. You develop a skin rash. You have a fever. Get help right away if: You have trouble breathing. You have chest pain. You vomit blood. These symptoms may be an emergency. Get help right away. Call 911. Do not wait to see if the symptoms will go away. Do not drive yourself to the hospital. Summary After the procedure, it is common to have a sore throat, hoarseness, nausea, vomiting, or to feel weak, sleepy, or fatigue. For the time period you were told by your health care provider, do not drive or use machinery. Get help right away if you have difficulty breathing, have chest pain, or vomit blood. These symptoms may be an emergency. This information is not intended to replace advice given to you by your health care provider. Make sure you discuss any questions you have with your health care provider. Document Revised: 03/22/2021 Document Reviewed: 03/22/2021 Elsevier Patient Education  2023 [employer]. How to Use Chlorhexidine Before Surgery Chlorhexidine gluconate (CHG) is a germ-killing (antiseptic) solution that is used to clean the skin. It  can get rid of the bacteria that normally live on the skin and can keep them away for about 24 hours. To clean your skin with CHG, you may be given: A CHG solution to use in the shower or as part of a sponge bath. A prepackaged cloth that contains CHG. Cleaning your skin with CHG may help lower the risk for infection: While you are staying in the intensive care unit of the hospital. If you have a vascular access, such as a central line, to provide short-term or long-term access  to your veins. If you have a catheter to drain urine from your bladder. If you are on a ventilator. A ventilator is a machine that helps you breathe by moving air in and out of your lungs. After surgery. What are the risks? Risks of using CHG include: A skin reaction. Hearing loss, if CHG gets in your ears and you have a perforated eardrum. Eye injury, if CHG gets in your eyes and is not rinsed out. The CHG product catching fire. Make sure that you avoid smoking and flames after applying CHG to your skin. Do not use CHG: If you have a chlorhexidine allergy or have previously reacted to chlorhexidine. On babies younger than 9 months of age. How to use CHG solution Use CHG only as told by your health care provider, and follow the instructions on the label. Use the full amount of CHG as directed. Usually, this is one bottle. During a shower Follow these steps when using CHG solution during a shower (unless your health care provider gives you different instructions): Start the shower. Use your normal soap and shampoo to wash your face and hair. Turn off the shower or move out of the shower stream. Pour the CHG onto a clean washcloth. Do not use any type of brush or rough-edged sponge. Starting at your neck, lather your body down to your toes. Make sure you follow these instructions: If you will be having surgery, pay special attention to the part of your body where you will be having surgery. Scrub this area for at  least 1 minute. Do not use CHG on your head or face. If the solution gets into your ears or eyes, rinse them well with water. Avoid your genital area. Avoid any areas of skin that have broken skin, cuts, or scrapes. Scrub your back and under your arms. Make sure to wash skin folds. Let the lather sit on your skin for 1-2 minutes or as long as told by your health care provider. Thoroughly rinse your entire body in the shower. Make sure that all body creases and crevices are rinsed well. Dry off with a clean towel. Do not put any substances on your body afterward--such as powder, lotion, or perfume--unless you are told to do so by your health care provider. Only use lotions that are recommended by the manufacturer. Put on clean clothes or pajamas. If it is the night before your surgery, sleep in clean sheets.  During a sponge bath Follow these steps when using CHG solution during a sponge bath (unless your health care provider gives you different instructions): Use your normal soap and shampoo to wash your face and hair. Pour the CHG onto a clean washcloth. Starting at your neck, lather your body down to your toes. Make sure you follow these instructions: If you will be having surgery, pay special attention to the part of your body where you will be having surgery. Scrub this area for at least 1 minute. Do not use CHG on your head or face. If the solution gets into your ears or eyes, rinse them well with water. Avoid your genital area. Avoid any areas of skin that have broken skin, cuts, or scrapes. Scrub your back and under your arms. Make sure to wash skin folds. Let the lather sit on your skin for 1-2 minutes or as long as told by your health care provider. Using a different clean, wet washcloth, thoroughly rinse your entire body. Make sure that all body creases and crevices are  rinsed well. Dry off with a clean towel. Do not put any substances on your body afterward--such as powder, lotion,  or perfume--unless you are told to do so by your health care provider. Only use lotions that are recommended by the manufacturer. Put on clean clothes or pajamas. If it is the night before your surgery, sleep in clean sheets. How to use CHG prepackaged cloths Only use CHG cloths as told by your health care provider, and follow the instructions on the label. Use the CHG cloth on clean, dry skin. Do not use the CHG cloth on your head or face unless your health care provider tells you to. When washing with the CHG cloth: Avoid your genital area. Avoid any areas of skin that have broken skin, cuts, or scrapes. Before surgery Follow these steps when using a CHG cloth to clean before surgery (unless your health care provider gives you different instructions): Using the CHG cloth, vigorously scrub the part of your body where you will be having surgery. Scrub using a back-and-forth motion for 3 minutes. The area on your body should be completely wet with CHG when you are done scrubbing. Do not rinse. Discard the cloth and let the area air-dry. Do not put any substances on the area afterward, such as powder, lotion, or perfume. Put on clean clothes or pajamas. If it is the night before your surgery, sleep in clean sheets.  For general bathing Follow these steps when using CHG cloths for general bathing (unless your health care provider gives you different instructions). Use a separate CHG cloth for each area of your body. Make sure you wash between any folds of skin and between your fingers and toes. Wash your body in the following order, switching to a new cloth after each step: The front of your neck, shoulders, and chest. Both of your arms, under your arms, and your hands. Your stomach and groin area, avoiding the genitals. Your right leg and foot. Your left leg and foot. The back of your neck, your back, and your buttocks. Do not rinse. Discard the cloth and let the area air-dry. Do not put any  substances on your body afterward--such as powder, lotion, or perfume--unless you are told to do so by your health care provider. Only use lotions that are recommended by the manufacturer. Put on clean clothes or pajamas. Contact a health care provider if: Your skin gets irritated after scrubbing. You have questions about using your solution or cloth. You swallow any chlorhexidine. Call your local poison control center ([phone] in the [place] Get help right away if: Your eyes itch badly, or they become very red or swollen. Your skin itches badly and is red or swollen. Your hearing changes. You have trouble seeing. You have swelling or tingling in your mouth or throat. You have trouble breathing. These symptoms may represent a serious problem that is an emergency. Do not wait to see if the symptoms will go away. Get medical help right away. Call your local emergency services (911 in the [place] Do not drive yourself to the hospital. Summary Chlorhexidine gluconate (CHG) is a germ-killing (antiseptic) solution that is used to clean the skin. Cleaning your skin with CHG may help to lower your risk for infection. You may be given CHG to use for bathing. It may be in a bottle or in a prepackaged cloth to use on your skin. Carefully follow your health care provider's instructions and the instructions on the product label. Do not use CHG  if you have a chlorhexidine allergy. Contact your health care provider if your skin gets irritated after scrubbing. This information is not intended to replace advice given to you by your health care provider. Make sure you discuss any questions you have with your health care provider. Document Revised: 04/22/2021 Document Reviewed: 03/05/2020 Elsevier Patient Education  2023 [employer].

## 2021-08-13 ENCOUNTER — Encounter (HOSPITAL_COMMUNITY)
Admission: RE | Admit: 2021-08-13 | Discharge: 2021-08-13 | Disposition: A | Discharge status: Home / Self Care | Payer: Medicaid Other | Source: Ambulatory Visit | Attending: General Surgery | Admitting: General Surgery

## 2021-08-13 ENCOUNTER — Encounter (HOSPITAL_COMMUNITY): Payer: Self-pay

## 2021-08-13 DIAGNOSIS — Z8679 Personal history of other diseases of the circulatory system: Secondary | ICD-10-CM | POA: Insufficient documentation

## 2021-08-13 DIAGNOSIS — Z01818 Encounter for other preprocedural examination: Secondary | ICD-10-CM | POA: Diagnosis present

## 2021-08-13 HISTORY — DX: Family history of other specified conditions: Z84.89

## 2021-08-13 HISTORY — DX: Other specified postprocedural states: Z98.890

## 2021-08-13 LAB — POCT PREGNANCY, URINE: Preg Test, Ur: NEGATIVE

## 2021-08-15 ENCOUNTER — Encounter (HOSPITAL_COMMUNITY)
Admission: RE | Disposition: A | Discharge status: Home / Self Care | Payer: Self-pay | Source: Home / Self Care | Attending: General Surgery

## 2021-08-15 ENCOUNTER — Ambulatory Visit (HOSPITAL_COMMUNITY): Payer: Medicaid Other | Admitting: Anesthesiology

## 2021-08-15 ENCOUNTER — Ambulatory Visit (HOSPITAL_COMMUNITY)
Admission: RE | Admit: 2021-08-15 | Discharge: 2021-08-15 | Disposition: A | Discharge status: Home / Self Care | Payer: Medicaid Other | Attending: General Surgery | Admitting: General Surgery

## 2021-08-15 ENCOUNTER — Ambulatory Visit (HOSPITAL_COMMUNITY): Payer: Medicaid Other

## 2021-08-15 ENCOUNTER — Ambulatory Visit (HOSPITAL_BASED_OUTPATIENT_CLINIC_OR_DEPARTMENT_OTHER): Payer: Medicaid Other | Admitting: Anesthesiology

## 2021-08-15 ENCOUNTER — Other Ambulatory Visit: Payer: Self-pay

## 2021-08-15 DIAGNOSIS — Z9884 Bariatric surgery status: Secondary | ICD-10-CM | POA: Insufficient documentation

## 2021-08-15 DIAGNOSIS — I10 Essential (primary) hypertension: Secondary | ICD-10-CM | POA: Diagnosis not present

## 2021-08-15 DIAGNOSIS — M549 Dorsalgia, unspecified: Secondary | ICD-10-CM | POA: Diagnosis not present

## 2021-08-15 DIAGNOSIS — G8929 Other chronic pain: Secondary | ICD-10-CM | POA: Insufficient documentation

## 2021-08-15 DIAGNOSIS — K8012 Calculus of gallbladder with acute and chronic cholecystitis without obstruction: Secondary | ICD-10-CM | POA: Insufficient documentation

## 2021-08-15 DIAGNOSIS — K219 Gastro-esophageal reflux disease without esophagitis: Secondary | ICD-10-CM | POA: Diagnosis not present

## 2021-08-15 DIAGNOSIS — F909 Attention-deficit hyperactivity disorder, unspecified type: Secondary | ICD-10-CM | POA: Diagnosis not present

## 2021-08-15 DIAGNOSIS — K8070 Calculus of gallbladder and bile duct without cholecystitis without obstruction: Secondary | ICD-10-CM

## 2021-08-15 DIAGNOSIS — K8020 Calculus of gallbladder without cholecystitis without obstruction: Secondary | ICD-10-CM | POA: Diagnosis not present

## 2021-08-15 DIAGNOSIS — Z01818 Encounter for other preprocedural examination: Secondary | ICD-10-CM

## 2021-08-15 HISTORY — PX: CHOLECYSTECTOMY: SHX55

## 2021-08-15 LAB — GLUCOSE, CAPILLARY
Glucose-Capillary: 69 mg/dL — ABNORMAL LOW (ref 70–99)
Glucose-Capillary: 125 mg/dL — ABNORMAL HIGH (ref 70–99)
Glucose-Capillary: 115 mg/dL — ABNORMAL HIGH (ref 70–99)

## 2021-08-15 SURGERY — LAPAROSCOPIC CHOLECYSTECTOMY WITH INTRAOPERATIVE CHOLANGIOGRAM
Anesthesia: General | Site: Abdomen

## 2021-08-15 MED ORDER — METOCLOPRAMIDE HCL 5 MG/ML IJ SOLN
INTRAMUSCULAR | Status: AC
  Filled 2021-08-15: qty 2

## 2021-08-15 MED ORDER — ORAL CARE MOUTH RINSE: 15.0000 mL | Freq: Once | OROMUCOSAL | Status: AC

## 2021-08-15 MED ORDER — CEFAZOLIN SODIUM-DEXTROSE 2-4 GM/100ML-% IV SOLN
2.0000 g | INTRAVENOUS | Status: AC
  Administered 2021-08-15: 2 g via INTRAVENOUS
  Filled 2021-08-15: qty 100

## 2021-08-15 MED ORDER — PROMETHAZINE HCL 25 MG/ML IJ SOLN
INTRAMUSCULAR | Status: AC
  Filled 2021-08-15: qty 1

## 2021-08-15 MED ORDER — OXYCODONE HCL 5 MG PO TABS: 5.0000 mg | ORAL_TABLET | ORAL | 0 refills | Status: AC | PRN

## 2021-08-15 MED ORDER — METOCLOPRAMIDE HCL 5 MG/ML IJ SOLN
10.0000 mg | Freq: Once | INTRAMUSCULAR | Status: AC
  Administered 2021-08-15: 10 mg via INTRAVENOUS

## 2021-08-15 MED ORDER — HEMOSTATIC AGENTS (NO CHARGE) OPTIME
TOPICAL | Status: DC | PRN
  Administered 2021-08-15 (×2): 1

## 2021-08-15 MED ORDER — PROPOFOL 10 MG/ML IV BOLUS
INTRAVENOUS | Status: DC | PRN
  Administered 2021-08-15: 200 mg via INTRAVENOUS

## 2021-08-15 MED ORDER — ONDANSETRON HCL 4 MG/2ML IJ SOLN: 4.0000 mg | Freq: Once | INTRAMUSCULAR | Status: DC | PRN

## 2021-08-15 MED ORDER — MEPERIDINE HCL 50 MG/ML IJ SOLN: 6.2500 mg | INTRAMUSCULAR | Status: DC | PRN

## 2021-08-15 MED ORDER — CHLORHEXIDINE GLUCONATE CLOTH 2 % EX PADS: 6.0000 | MEDICATED_PAD | Freq: Once | CUTANEOUS | Status: DC

## 2021-08-15 MED ORDER — DEXMEDETOMIDINE (PRECEDEX) IN NS 20 MCG/5ML (4 MCG/ML) IV SYRINGE
PREFILLED_SYRINGE | INTRAVENOUS | Status: DC | PRN
  Administered 2021-08-15: 4 ug via INTRAVENOUS

## 2021-08-15 MED ORDER — PROPOFOL 10 MG/ML IV BOLUS
INTRAVENOUS | Status: AC
  Filled 2021-08-15: qty 20

## 2021-08-15 MED ORDER — SCOPOLAMINE 1 MG/3DAYS TD PT72
1.0000 | MEDICATED_PATCH | Freq: Once | TRANSDERMAL | Status: DC
  Administered 2021-08-15: 1.5 mg via TRANSDERMAL
  Filled 2021-08-15: qty 1

## 2021-08-15 MED ORDER — CHLORHEXIDINE GLUCONATE 0.12 % MT SOLN
15.0000 mL | Freq: Once | OROMUCOSAL | Status: AC
  Administered 2021-08-15: 15 mL via OROMUCOSAL

## 2021-08-15 MED ORDER — HYDROMORPHONE HCL 1 MG/ML IJ SOLN
0.2500 mg | INTRAMUSCULAR | Status: DC | PRN
  Administered 2021-08-15 (×2): 0.5 mg via INTRAVENOUS
  Filled 2021-08-15 (×2): qty 0.5

## 2021-08-15 MED ORDER — DEXTROSE 50 % IV SOLN
50.0000 mL | Freq: Once | INTRAVENOUS | Status: AC
  Administered 2021-08-15: 50 mL via INTRAVENOUS

## 2021-08-15 MED ORDER — PROPOFOL 500 MG/50ML IV EMUL
INTRAVENOUS | Status: AC
  Filled 2021-08-15: qty 200

## 2021-08-15 MED ORDER — FENTANYL CITRATE (PF) 250 MCG/5ML IJ SOLN
INTRAMUSCULAR | Status: AC
  Filled 2021-08-15: qty 5

## 2021-08-15 MED ORDER — LIDOCAINE HCL (CARDIAC) PF 100 MG/5ML IV SOSY
PREFILLED_SYRINGE | INTRAVENOUS | Status: DC | PRN
  Administered 2021-08-15: 50 mg via INTRAVENOUS

## 2021-08-15 MED ORDER — SODIUM CHLORIDE FLUSH 0.9 % IV SOLN
INTRAVENOUS | Status: AC
  Filled 2021-08-15: qty 10

## 2021-08-15 MED ORDER — DEXMEDETOMIDINE HCL IN NACL 200 MCG/50ML IV SOLN
INTRAVENOUS | Status: DC | PRN
  Administered 2021-08-15 (×2): 4 ug via INTRAVENOUS

## 2021-08-15 MED ORDER — FENTANYL CITRATE (PF) 100 MCG/2ML IJ SOLN
INTRAMUSCULAR | Status: DC | PRN
  Administered 2021-08-15 (×5): 50 ug via INTRAVENOUS

## 2021-08-15 MED ORDER — PROPOFOL 500 MG/50ML IV EMUL
INTRAVENOUS | Status: DC | PRN
  Administered 2021-08-15: 100 ug/kg/min via INTRAVENOUS

## 2021-08-15 MED ORDER — BUPIVACAINE LIPOSOME 1.3 % IJ SUSP
INTRAMUSCULAR | Status: AC
  Filled 2021-08-15: qty 20

## 2021-08-15 MED ORDER — DEXAMETHASONE SODIUM PHOSPHATE 10 MG/ML IJ SOLN
INTRAMUSCULAR | Status: DC | PRN
  Administered 2021-08-15: 4 mg via INTRAVENOUS

## 2021-08-15 MED ORDER — MIDAZOLAM HCL 2 MG/2ML IJ SOLN
2.0000 mg | Freq: Once | INTRAMUSCULAR | Status: AC
  Administered 2021-08-15: 2 mg via INTRAVENOUS

## 2021-08-15 MED ORDER — ROCURONIUM BROMIDE 10 MG/ML (PF) SYRINGE
PREFILLED_SYRINGE | INTRAVENOUS | Status: DC | PRN
  Administered 2021-08-15: 60 mg via INTRAVENOUS

## 2021-08-15 MED ORDER — MIDAZOLAM HCL 2 MG/2ML IJ SOLN
INTRAMUSCULAR | Status: AC
  Filled 2021-08-15: qty 2

## 2021-08-15 MED ORDER — BUPIVACAINE LIPOSOME 1.3 % IJ SUSP
INTRAMUSCULAR | Status: DC | PRN
  Administered 2021-08-15: 20 mL

## 2021-08-15 MED ORDER — LACTATED RINGERS IV SOLN: INTRAVENOUS | Status: DC

## 2021-08-15 MED ORDER — PROMETHAZINE HCL 25 MG/ML IJ SOLN
12.5000 mg | Freq: Once | INTRAMUSCULAR | Status: AC
  Administered 2021-08-15: 12.5 mg via INTRAVENOUS

## 2021-08-15 MED ORDER — 0.9 % SODIUM CHLORIDE (POUR BTL) OPTIME
TOPICAL | Status: DC | PRN
  Administered 2021-08-15: 1000 mL

## 2021-08-15 MED ORDER — ONDANSETRON HCL 4 MG/2ML IJ SOLN
INTRAMUSCULAR | Status: DC | PRN
  Administered 2021-08-15: 4 mg via INTRAVENOUS

## 2021-08-15 MED ORDER — DEXTROSE 50 % IV SOLN
INTRAVENOUS | Status: AC
  Filled 2021-08-15: qty 50

## 2021-08-15 MED ORDER — SUGAMMADEX SODIUM 200 MG/2ML IV SOLN
INTRAVENOUS | Status: DC | PRN
  Administered 2021-08-15: 200 mg via INTRAVENOUS

## 2021-08-15 SURGICAL SUPPLY — 47 items
ADH SKN CLS APL DERMABOND .7 (GAUZE/BANDAGES/DRESSINGS) ×1
APL ESCP 73.6OZ SRGCL (TIP) ×1
APL PRP STRL LF DISP 70% ISPRP (MISCELLANEOUS) ×1
APPLIER CLIP ROT 10 11.4 M/L (STAPLE) ×2
APR CLP MED LRG 11.4X10 (STAPLE) ×1
CHLORAPREP W/TINT 26 (MISCELLANEOUS) ×2 IMPLANT
CLIP APPLIE ROT 10 11.4 M/L (STAPLE) ×1 IMPLANT
CLOTH BEACON ORANGE TIMEOUT ST (SAFETY) ×2 IMPLANT
COVER LIGHT HANDLE STERIS (MISCELLANEOUS) ×4 IMPLANT
DERMABOND ADVANCED (GAUZE/BANDAGES/DRESSINGS) ×1
DERMABOND ADVANCED .7 DNX12 (GAUZE/BANDAGES/DRESSINGS) ×1 IMPLANT
ELECT REM PT RETURN 9FT ADLT (ELECTROSURGICAL) ×2
ELECTRODE REM PT RTRN 9FT ADLT (ELECTROSURGICAL) ×1 IMPLANT
GLOVE BIOGEL PI IND STRL 7.0 (GLOVE) ×2 IMPLANT
GLOVE BIOGEL PI IND STRL 7.5 (GLOVE) IMPLANT
GLOVE BIOGEL PI INDICATOR 7.0 (GLOVE) ×2
GLOVE BIOGEL PI INDICATOR 7.5 (GLOVE) ×1
GLOVE ECLIPSE 6.5 STRL STRAW (GLOVE) ×1 IMPLANT
GLOVE SURG SS PI 7.0 STRL IVOR (GLOVE) ×1 IMPLANT
GLOVE SURG SS PI 7.5 STRL IVOR (GLOVE) ×2 IMPLANT
GOWN STRL REUS W/TWL LRG LVL3 (GOWN DISPOSABLE) ×6 IMPLANT
HEMOSTAT SNOW SURGICEL 2X4 (HEMOSTASIS) ×2 IMPLANT
INST SET LAPROSCOPIC AP (KITS) ×2 IMPLANT
KIT TURNOVER KIT A (KITS) ×2 IMPLANT
MANIFOLD NEPTUNE II (INSTRUMENTS) ×2 IMPLANT
NDL HYPO 21X1.5 SAFETY (NEEDLE) ×1 IMPLANT
NEEDLE HYPO 21X1.5 SAFETY (NEEDLE) ×2 IMPLANT
NEEDLE INSUFFLATION 120MM (ENDOMECHANICALS) ×2 IMPLANT
NS IRRIG 1000ML POUR BTL (IV SOLUTION) ×2 IMPLANT
PACK LAP CHOLE LZT030E (CUSTOM PROCEDURE TRAY) ×2 IMPLANT
PAD ARMBOARD 7.5X6 YLW CONV (MISCELLANEOUS) ×2 IMPLANT
PENCIL HANDSWITCHING (ELECTRODE) ×1 IMPLANT
POWDER SURGICEL 3.0 GRAM (HEMOSTASIS) ×1 IMPLANT
SET BASIN LINEN APH (SET/KITS/TRAYS/PACK) ×2 IMPLANT
SET TUBE SMOKE EVAC HIGH FLOW (TUBING) ×2 IMPLANT
SLEEVE Z-THREAD 5X100MM (TROCAR) ×2 IMPLANT
SUT MNCRL AB 4-0 PS2 18 (SUTURE) ×4 IMPLANT
SUT VICRYL 0 UR6 27IN ABS (SUTURE) ×3 IMPLANT
SYR 30ML LL (SYRINGE) ×2 IMPLANT
SYS BAG RETRIEVAL 10MM (BASKET) ×2
SYSTEM BAG RETRIEVAL 10MM (BASKET) ×1 IMPLANT
TIP ENDOSCOPIC SURGICEL (TIP) ×1 IMPLANT
TROCAR Z-THRD FIOS HNDL 11X100 (TROCAR) ×2 IMPLANT
TROCAR Z-THREAD FIOS 5X100MM (TROCAR) ×2 IMPLANT
TROCAR Z-THREAD SLEEVE 11X100 (TROCAR) ×2 IMPLANT
TUBE CONNECTING 12X1/4 (SUCTIONS) ×2 IMPLANT
WARMER LAPAROSCOPE (MISCELLANEOUS) ×2 IMPLANT

## 2021-08-15 NOTE — Anesthesia Procedure Notes (Signed)
Procedure Name: Intubation Date/Time: 08/15/2021 8:46 AM  Performed by: Molli Barrows, MDPre-anesthesia Checklist: Patient identified, Emergency Drugs available, Suction available and Patient being monitored Patient Re-evaluated:Patient Re-evaluated prior to induction Oxygen Delivery Method: Circle system utilized Preoxygenation: Pre-oxygenation with 100% oxygen Induction Type: IV induction Ventilation: Mask ventilation without difficulty Laryngoscope Size: Miller and 2 Grade View: Grade I Tube type: Oral Tube size: 7.0 mm Number of attempts: 1 Airway Equipment and Method: Stylet Placement Confirmation: ETT inserted through vocal cords under direct vision, positive ETCO2 and breath sounds checked- equal and bilateral Secured at: 21 cm Tube secured with: Tape Dental Injury: Teeth and Oropharynx as per pre-operative assessment

## 2021-08-15 NOTE — Anesthesia Postprocedure Evaluation (Signed)
Anesthesia Post Note  Patient: Alexis Montgomery  Procedure(s) Performed: LAPAROSCOPIC CHOLECYSTECTOMY (Abdomen)  Patient location during evaluation: Phase II Anesthesia Type: General Level of consciousness: awake and alert and oriented Pain management: pain level controlled Vital Signs Assessment: post-procedure vital signs reviewed and stable Respiratory status: spontaneous breathing, nonlabored ventilation and respiratory function stable Cardiovascular status: blood pressure returned to baseline and stable Postop Assessment: no apparent nausea or vomiting Anesthetic complications: no   No notable events documented.   Last Vitals:  Vitals:   08/15/21 1100 08/15/21 1106  BP: 100/64 117/70  Pulse: 81 65  Resp: 18 18  Temp:  36.6 C  SpO2: 98% 98%    Last Pain:  Vitals:   08/15/21 1106  TempSrc: Oral  PainSc: 6                  Charletta Voight C Malita Ignasiak

## 2021-08-15 NOTE — Transfer of Care (Signed)
Immediate Anesthesia Transfer of Care Note  Patient: Alexis Montgomery  Procedure(s) Performed: LAPAROSCOPIC CHOLECYSTECTOMY (Abdomen)  Patient Location: PACU  Anesthesia Type:General  Level of Consciousness: awake, alert  and oriented  Airway & Oxygen Therapy: Patient Spontanous Breathing  Post-op Assessment: Report given to RN and Post -op Vital signs reviewed and stable  Post vital signs: Reviewed and stable  Last Vitals:  Vitals Value Taken Time  BP 114/65 08/15/21 0948  Temp 36.9 C 08/15/21 0948  Pulse 74 08/15/21 0952  Resp 13 08/15/21 0952  SpO2 99 % 08/15/21 0952  Vitals shown include unvalidated device data.  Last Pain:  Vitals:   08/15/21 0745  TempSrc: Oral  PainSc: 0-No pain      Patients Stated Pain Goal: 9 (08/15/21 0745)  Complications: No notable events documented.

## 2021-08-15 NOTE — Interval H&P Note (Signed)
History and Physical Interval Note:  08/15/2021 7:35 AM  Alexis Montgomery  has presented today for surgery, with the diagnosis of CHOLELITHIASIS.  The various methods of treatment have been discussed with the patient and family. After consideration of risks, benefits and other options for treatment, the patient has consented to  Procedure(s): LAPAROSCOPIC CHOLECYSTECTOMY WITH INTRAOPERATIVE CHOLANGIOGRAM (N/A) as a surgical intervention.  The patient's history has been reviewed, patient examined, no change in status, stable for surgery.  I have reviewed the patient's chart and labs.  Questions were answered to the patient's satisfaction.     Franky Macho

## 2021-08-15 NOTE — Op Note (Signed)
Patient:  Alexis Montgomery  DOB:  1990/07/10  MRN:  [id number]   Preop Diagnosis: Biliary colic, cholelithiasis  Postop Diagnosis: Same  Procedure: Laparoscopic cholecystectomy  Surgeon: Franky Macho, MD  Anes: General endotracheal  Indications: Patient is a 31 year old white female who presents with biliary colic secondary to cholelithiasis.  The risks and benefits of the procedure including bleeding, infection, hepatobiliary injury, and the possibility of an open procedure were fully explained to the patient, who gave informed consent.  Procedure note: The patient was placed in the supine position.  After induction of general endotracheal anesthesia, the abdomen was prepped and draped using the usual sterile technique with ChloraPrep.  Surgical site confirmation was performed.  A supraumbilical incision was made down to the fascia.  A Veress needle was introduced into the abdominal cavity and confirmation of placement was done using the saline drop test.  The abdomen was then insufflated to 15 mmHg pressure.  An 11 mm trocar was introduced into the abdominal cavity under direct visualization without difficulty.  The patient was placed in reverse Trendelenburg position and an additional 11 mm trocar was placed in the epigastric region and 5 mm trocars were placed in the right upper quadrant and right flank regions.  Liver was inspected and noted to be within normal limits.  The gallbladder was noted to be full of stones.  The gallbladder was retracted in a dynamic fashion in order to provide a critical view of the triangle of Calot.  The cystic duct was first identified.  Its juncture to the infundibulum was fully identified.  Endoclips were placed proximally and distally on the cystic duct, and the cystic duct was divided.  This was likewise done to the cystic artery.  The gallbladder was freed away from the gallbladder fossa using Bovie electrocautery.  The gallbladder was delivered through the  epigastric trocar site using an Endo Catch bag.  The gallbladder fossa was inspected and no abnormal bleeding was noted.  Surgicel powder and snow were placed in the gallbladder fossa.  All fluid and air were then evacuated from the abdominal cavity prior to removal of the trocars.  All wounds were irrigated with normal saline.  All wounds were injected with Exparel.  The epigastric fascia was reapproximated using an 0 Vicryl interrupted suture.  All skin incisions were closed using a 4-0 Monocryl subcuticular suture.  Dermabond was applied.  All tape and needle counts were correct at the end of the procedure.  The patient was extubated in the operating room and transferred to PACU in stable condition.  Complications: None  EBL: Minimal  Specimen: Gallbladder

## 2021-08-15 NOTE — Anesthesia Preprocedure Evaluation (Addendum)
Anesthesia Evaluation  Patient identified by MRN, date of birth, ID band Patient awake    Reviewed: Allergy & Precautions, NPO status , Patient's Chart, lab work & pertinent test results  History of Anesthesia Complications (+) PONV, Family history of anesthesia reaction and history of anesthetic complications (Mother has [place] ancestors, slightly high risk of MH as per literature, patient had previous procedures under [facility] and no complications)  Airway Mallampati: II  TM Distance: >3 FB Neck ROM: Full    Dental  (+) Dental Advisory Given, Teeth Intact   Pulmonary neg pulmonary ROS,    Pulmonary exam normal breath sounds clear to auscultation       Cardiovascular Exercise Tolerance: Good hypertension, Normal cardiovascular exam Rhythm:Regular Rate:Normal     Neuro/Psych PSYCHIATRIC DISORDERS negative neurological ROS     GI/Hepatic Neg liver ROS, GERD  Medicated and Poorly Controlled,  Endo/Other  negative endocrine ROS  Renal/GU Renal disease  negative genitourinary   Musculoskeletal negative musculoskeletal ROS (+)   Abdominal   Peds  (+) ADHD Hematology  (+) Blood dyscrasia, anemia ,   Anesthesia Other Findings Chronic back pain Hypoglycemic episodes, FSBS 67. 50 ml of D50 was given preop.  Reproductive/Obstetrics negative OB ROS                           Anesthesia Physical Anesthesia Plan  ASA: 2  Anesthesia Plan: General   Post-op Pain Management: Dilaudid IV   Induction: Intravenous  PONV Risk Score and Plan: 4 or greater and Ondansetron, Dexamethasone, Midazolam, Scopolamine patch - Pre-op and Metaclopromide  Airway Management Planned: Oral ETT  Additional Equipment:   Intra-op Plan:   Post-operative Plan: Extubation in OR  Informed Consent: I have reviewed the patients History and Physical, chart, labs and discussed the procedure including the risks, benefits and  alternatives for the proposed anesthesia with the patient or authorized representative who has indicated his/her understanding and acceptance.     Dental advisory given  Plan Discussed with: CRNA and Surgeon  Anesthesia Plan Comments:         Anesthesia Quick Evaluation

## 2021-08-16 ENCOUNTER — Encounter (HOSPITAL_COMMUNITY): Payer: Self-pay | Admitting: General Surgery

## 2021-08-16 LAB — SURGICAL PATHOLOGY

## 2021-08-27 ENCOUNTER — Encounter: Payer: Self-pay | Admitting: General Surgery

## 2021-08-27 ENCOUNTER — Ambulatory Visit (INDEPENDENT_AMBULATORY_CARE_PROVIDER_SITE_OTHER): Payer: Medicaid Other | Admitting: General Surgery

## 2021-08-27 DIAGNOSIS — Z09 Encounter for follow-up examination after completed treatment for conditions other than malignant neoplasm: Secondary | ICD-10-CM

## 2021-08-27 NOTE — Progress Notes (Signed)
Subjective:     Alexis Montgomery  Virtual telephone visit performed with patient.  Patient states she started having nausea and vomiting today.  She has had epigastric incisional pain that is somewhat made worse with movement.  She also describes having low back pain.  She denies any fevers.  She has not returned back to work yet because she feels tired. Objective:    LMP 07/16/2021 Comment: Urine pregnancy negative on 08-13-2021  General:  alert and cooperative       Assessment:    Postoperative nausea and vomiting with incisional pain, status post laparoscopic cholecystectomy    Plan:   I told the patient to go to the emergency room should her symptoms continue or worsen.  She understands this.  We will follow-up with her expectantly.  As this was a part of the total global surgical fee, this was not a billable visit.  Total telephone time was 4-1/2 minutes.

## 2021-09-24 ENCOUNTER — Inpatient Hospital Stay: Payer: Medicaid Other | Attending: Hematology

## 2021-10-01 ENCOUNTER — Inpatient Hospital Stay: Payer: Medicaid Other | Admitting: Physician Assistant

## 2021-10-10 ENCOUNTER — Ambulatory Visit: Payer: Medicaid Other | Admitting: Obstetrics & Gynecology

## 2021-10-22 ENCOUNTER — Inpatient Hospital Stay: Payer: Medicaid Other | Attending: Hematology

## 2021-10-22 DIAGNOSIS — D500 Iron deficiency anemia secondary to blood loss (chronic): Secondary | ICD-10-CM

## 2021-10-22 DIAGNOSIS — D509 Iron deficiency anemia, unspecified: Secondary | ICD-10-CM | POA: Diagnosis present

## 2021-10-22 DIAGNOSIS — K912 Postsurgical malabsorption, not elsewhere classified: Secondary | ICD-10-CM

## 2021-10-22 DIAGNOSIS — Z9884 Bariatric surgery status: Secondary | ICD-10-CM

## 2021-10-22 LAB — CBC WITH DIFFERENTIAL/PLATELET
Abs Immature Granulocytes: 0.03 10*3/uL (ref 0.00–0.07)
Basophils Absolute: 0.1 10*3/uL (ref 0.0–0.1)
Basophils Relative: 2 %
Eosinophils Absolute: 0.3 10*3/uL (ref 0.0–0.5)
Eosinophils Relative: 4 %
HCT: 36.1 % (ref 36.0–46.0)
Hemoglobin: 11.7 g/dL — ABNORMAL LOW (ref 12.0–15.0)
Immature Granulocytes: 0 %
Lymphocytes Relative: 29 %
Lymphs Abs: 2.4 10*3/uL (ref 0.7–4.0)
MCH: 28.1 pg (ref 26.0–34.0)
MCHC: 32.4 g/dL (ref 30.0–36.0)
MCV: 86.8 fL (ref 80.0–100.0)
Monocytes Absolute: 0.6 10*3/uL (ref 0.1–1.0)
Monocytes Relative: 7 %
Neutro Abs: 4.7 10*3/uL (ref 1.7–7.7)
Neutrophils Relative %: 58 %
Platelets: 360 10*3/uL (ref 150–400)
RBC: 4.16 MIL/uL (ref 3.87–5.11)
RDW: 13.9 % (ref 11.5–15.5)
WBC: 8.1 10*3/uL (ref 4.0–10.5)
nRBC: 0 % (ref 0.0–0.2)

## 2021-10-22 LAB — IRON AND TIBC
Iron: 48 ug/dL (ref 28–170)
Saturation Ratios: 10 % — ABNORMAL LOW (ref 10.4–31.8)
TIBC: 479 ug/dL — ABNORMAL HIGH (ref 250–450)
UIBC: 431 ug/dL

## 2021-10-22 LAB — FERRITIN: Ferritin: 4 ng/mL — ABNORMAL LOW (ref 11–307)

## 2021-10-29 ENCOUNTER — Inpatient Hospital Stay (HOSPITAL_BASED_OUTPATIENT_CLINIC_OR_DEPARTMENT_OTHER): Payer: Medicaid Other | Admitting: Physician Assistant

## 2021-10-29 ENCOUNTER — Encounter: Payer: Self-pay | Admitting: Physician Assistant

## 2021-10-29 DIAGNOSIS — D500 Iron deficiency anemia secondary to blood loss (chronic): Secondary | ICD-10-CM | POA: Diagnosis not present

## 2021-10-29 NOTE — Progress Notes (Signed)
Virtual Visit via Telephone Note [facility]  I connected with Alexis Montgomery  on 10/29/2021 at  2:00 PM  by telephone and verified that I am speaking with the correct person using two identifiers.  Location: Patient: Home Provider: [facility]   I discussed the limitations, risks, security and privacy concerns of performing an evaluation and management service by telephone and the availability of in person appointments. I also discussed with the patient that there may be a patient responsible charge related to this service. The patient expressed understanding and agreed to proceed.  REASON FOR VISIT: Iron deficiency anemia  PRIOR THERAPY: Oral iron supplement  CURRENT THERAPY: Intermittent IV iron (last Venofer 300 mg x 3 from 04/18/2021 through 05/13/2021)  INTERVAL HISTORY: Alexis Montgomery is contacted today for follow-up of her iron deficiency anemia.  She was last evaluated via telemedicine visit by Rojelio Brenner PA-C on 06/27/2021.  At today's visit, she reports feeling fair.  Since her last visit, she had laparoscopic cholecystectomy in August 2023, but denies any other major changes to her baseline health status.  Patient reports significant fatigue.  She denies any ice pica.  No chest pain, dyspnea on exertion, headaches, lightheadedness, syncope, or palpitations.  She has heavy menstrual cycles that last up to 10 days, with the first 5 days described as "very heavy with lots of clots."  She denies any other bleeding such as bright red blood per rectum or melena. She reports 25% energy and 100% appetite.    OBSERVATIONS/OBJECTIVE: Review of Systems  Constitutional:  Positive for malaise/fatigue. Negative for chills, diaphoresis, fever and weight loss.  Respiratory:  Negative for cough and shortness of breath.   Cardiovascular:  Negative for chest pain and palpitations.  Gastrointestinal:  Negative for abdominal pain, blood in stool, melena,  nausea and vomiting.  Neurological:  Negative for dizziness and headaches.    PHYSICAL EXAM (per limitations of virtual telephone visit): The patient is alert and oriented x 3, exhibiting adequate mentation, good mood, and ability to speak in full sentences and execute sound judgement.   ASSESSMENT & PLAN: 1.    Iron deficiency anemia - Patient seen at the request of her bariatric physician, Dr. Seymour Bars, for the evaluation and treatment of iron deficiency anemia. 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 - Failed to improve on oral iron supplementation and has required IV iron in the past - IV Venofer 300 mg x 3 from 04/18/2021 through 05/13/2021 - Reports significant fatigue. - Reports menorrhagia.  No melena or hematochezia. - Most recent labs (10/22/2021): Hgb 11.7, ferritin 4, iron saturation 10% with TIBC 479 - PLAN: Recommend additional IV iron with Venofer 300 mg x 3. - Repeat labs and RTC in 3 months with phone visit - Her other nutritional deficiencies (B12 and vitamin D) are monitored and treated by her bariatric provider   2.  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  She denies any tobacco, alcohol, or illicit drug use. - FAMILY: Maternal aunt with iron deficiency anemia.  No known family history of malignancy or other blood conditions.   FOLLOW UP INSTRUCTIONS: Venofer 300 mg x 3 Labs in 3 months Phone visit 1 week after labs    I discussed the assessment and treatment plan with the patient. The patient was provided an opportunity to ask  questions and all were answered. The patient agreed with the plan and demonstrated an understanding of the instructions.   The patient was advised to call back or seek an in-person evaluation if the symptoms worsen or if the condition fails to improve as anticipated.  I  provided 22 minutes of non-face-to-face time during this encounter.   Harriett Rush, PA-C 10/29/21 2:21 PM

## 2021-10-31 ENCOUNTER — Ambulatory Visit: Payer: Medicaid Other | Admitting: Obstetrics & Gynecology

## 2021-11-04 IMAGING — DX DG FINGER THUMB 2+V*R*
3 series · 3 of 3 positions shown · non-contrast
Comparison: None.

CLINICAL DATA: Post removal of EpiPen question of foreign body

EXAM:
RIGHT THUMB 2+V

[finger ap]
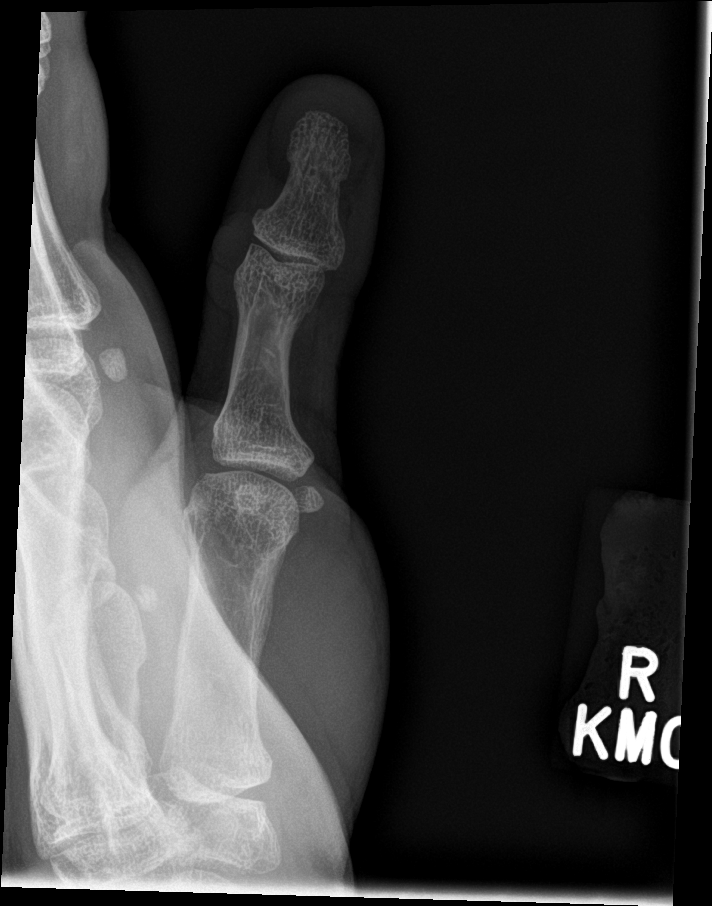

[finger obl]
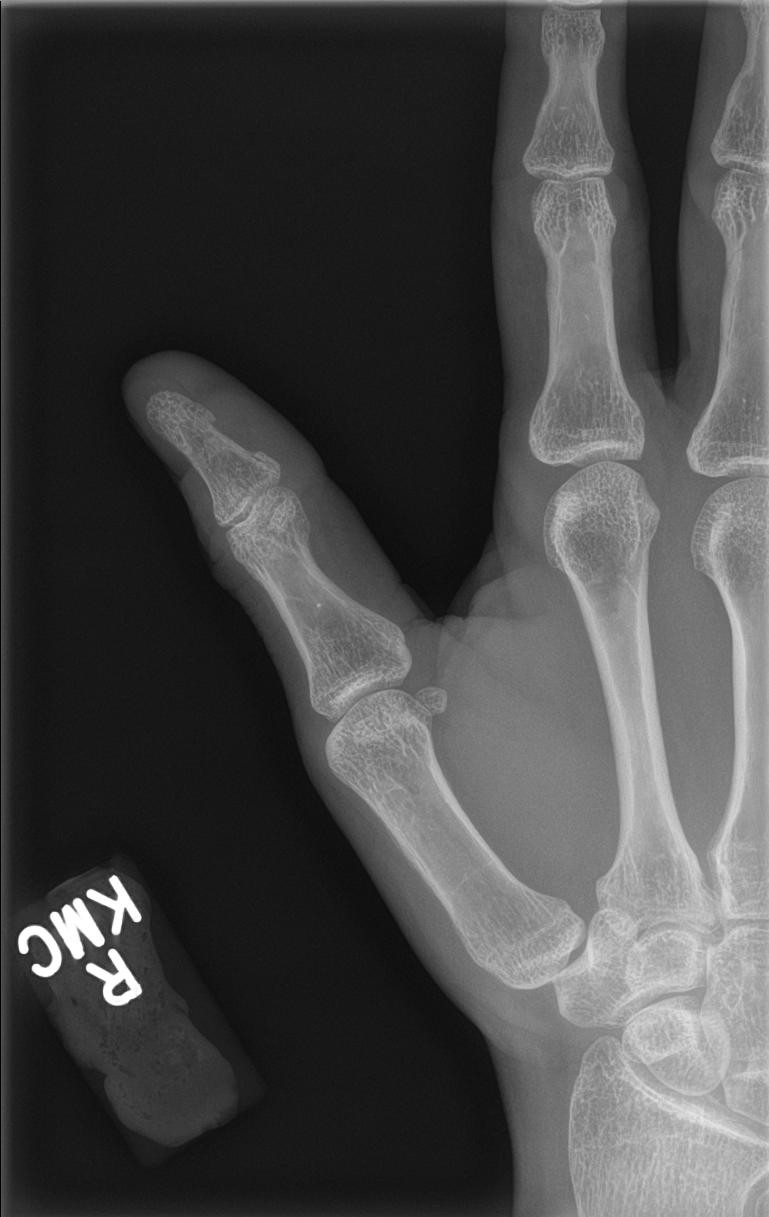

[finger lat]
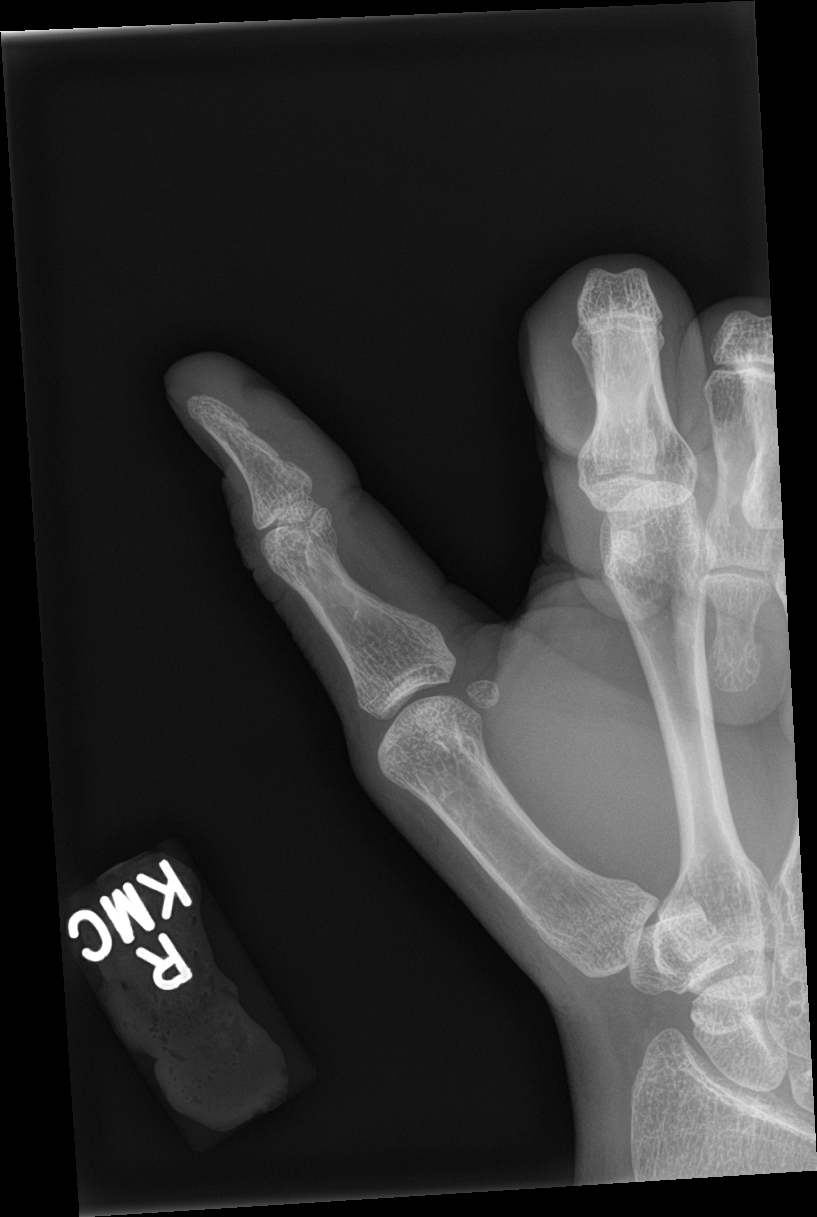

[3 of 3 positions shown; findings below may reference images not displayed]

FINDINGS: There is no evidence of fracture or dislocation. There is no
evidence of arthropathy or other focal bone abnormality. Soft tissue
swelling seen around the distal phalanx.
IMPRESSION: No radiopaque foreign body is seen.

## 2021-11-04 IMAGING — DX DG FINGER THUMB 2+V*R*
3 series · 3 of 3 positions shown · non-contrast
Comparison: None

CLINICAL DATA: Accidentally injected herself with an EpiPen in her
RIGHT thumb, still lodged at distal phalanx

EXAM:
RIGHT THUMB 2+V

[finger ap]
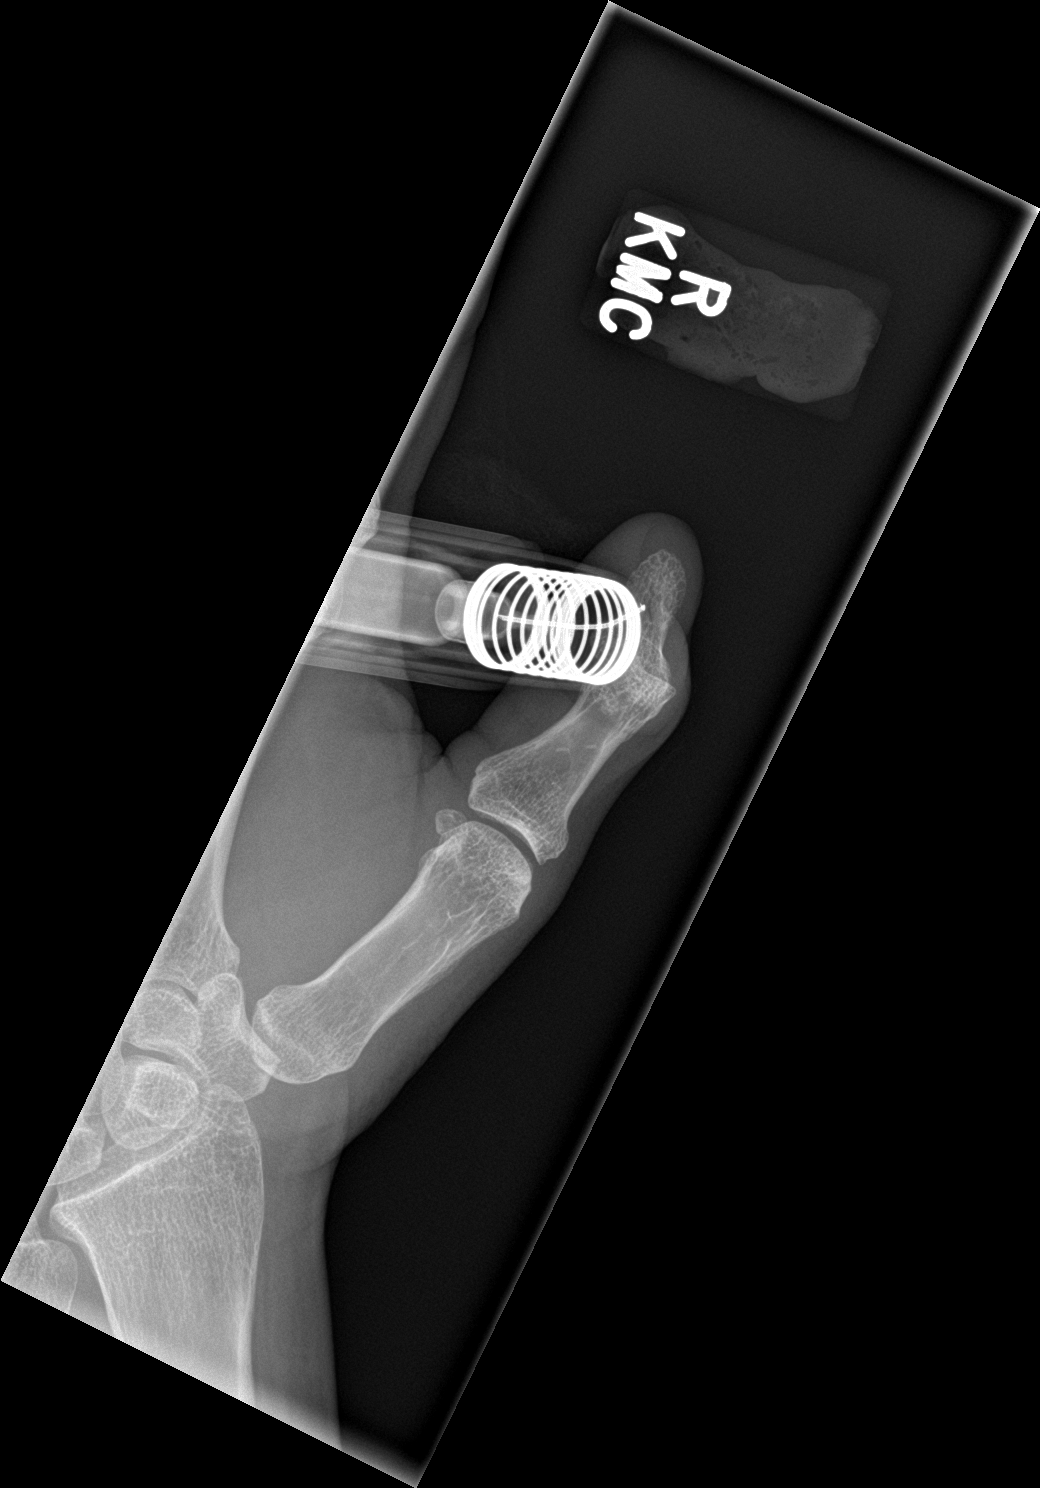

[finger obl]
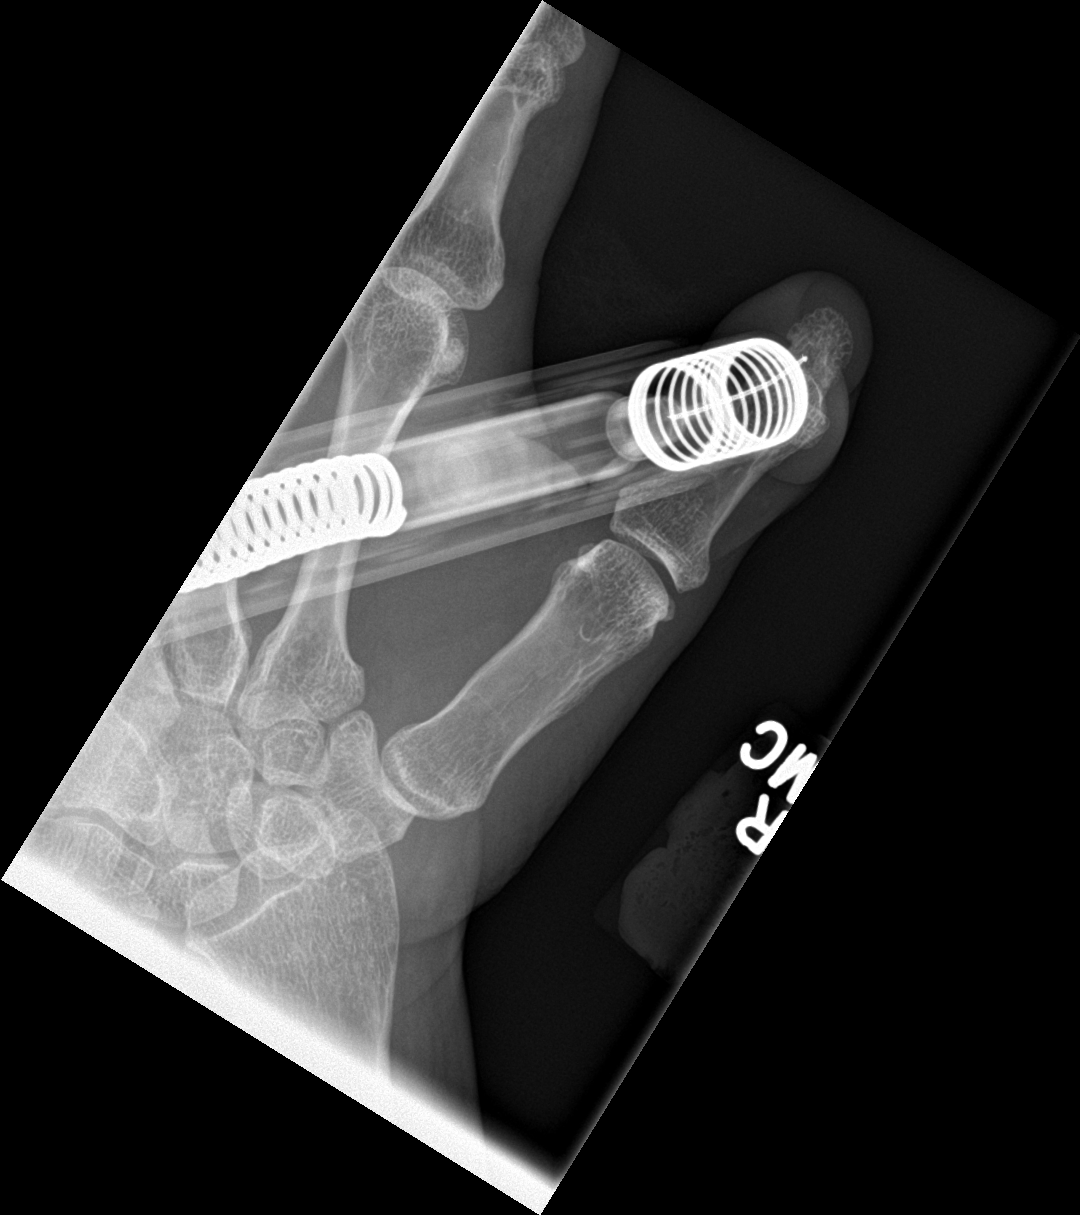

[finger lat]
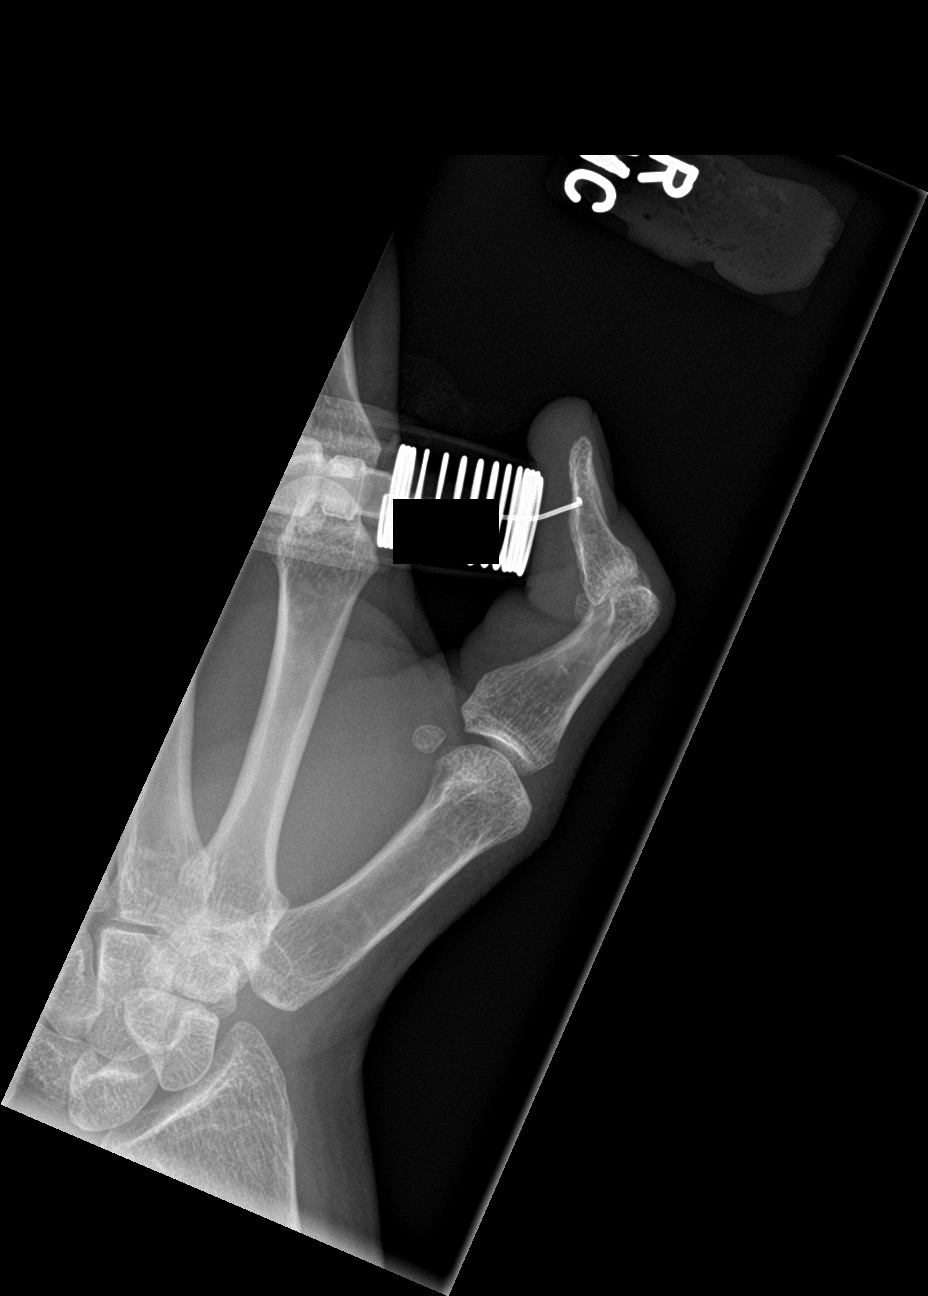

[3 of 3 positions shown; findings below may reference images not displayed]

FINDINGS: Large foreign body at distal phalanx and IP joint corresponding to
appy pen.

Osseous mineralization normal.

Joint spaces preserved.

No acute fracture or dislocation.

The tip of the needle projects within the volar aspect of the distal
phalanx of the RIGHT thumb.
IMPRESSION: Tip of the needle of the EpiPen projects within the volar aspect of
the distal phalanx of the RIGHT thumb.

No other focal osseous abnormalities.

## 2021-11-13 ENCOUNTER — Inpatient Hospital Stay: Payer: Medicaid Other | Attending: Hematology

## 2021-11-13 VITALS — BP 115/81 | HR 82 | Temp 97.8°F | Resp 18

## 2021-11-13 DIAGNOSIS — D509 Iron deficiency anemia, unspecified: Secondary | ICD-10-CM | POA: Diagnosis not present

## 2021-11-13 DIAGNOSIS — D500 Iron deficiency anemia secondary to blood loss (chronic): Secondary | ICD-10-CM

## 2021-11-13 DIAGNOSIS — K912 Postsurgical malabsorption, not elsewhere classified: Secondary | ICD-10-CM

## 2021-11-13 DIAGNOSIS — E611 Iron deficiency: Secondary | ICD-10-CM

## 2021-11-13 MED ORDER — SODIUM CHLORIDE 0.9 % IV SOLN
300.0000 mg | Freq: Once | INTRAVENOUS | Status: AC
  Administered 2021-11-13: 300 mg via INTRAVENOUS
  Filled 2021-11-13: qty 300

## 2021-11-13 MED ORDER — SODIUM CHLORIDE 0.9 % IV SOLN: Freq: Once | INTRAVENOUS | Status: AC

## 2021-11-13 MED ORDER — LORATADINE 10 MG PO TABS
10.0000 mg | ORAL_TABLET | Freq: Once | ORAL | Status: AC
  Administered 2021-11-13: 10 mg via ORAL
  Filled 2021-11-13: qty 1

## 2021-11-13 MED ORDER — ACETAMINOPHEN 325 MG PO TABS
650.0000 mg | ORAL_TABLET | Freq: Once | ORAL | Status: AC
  Administered 2021-11-13: 650 mg via ORAL
  Filled 2021-11-13: qty 2

## 2021-11-13 NOTE — Progress Notes (Signed)
Venofer 300 mg given today per MD orders. Tolerated infusion without adverse affects. Vital signs stable. No complaints at this time. Discharged from clinic ambulatory in stable condition. Alert and oriented x 3. F/U with [facility] as scheduled.   

## 2021-11-13 NOTE — Patient Instructions (Signed)
[facility] [place]  Discharge Instructions: Thank you for choosing [facility] to provide your oncology and hematology care.  If you have a lab appointment with the Cancer Center, please come in thru the Main Entrance and check in at the main information desk.  Wear comfortable clothing and clothing appropriate for easy access to any Portacath or PICC line.   We strive to give you quality time with your provider. You may need to reschedule your appointment if you arrive late (15 or more minutes).  Arriving late affects you and other patients whose appointments are after yours.  Also, if you miss three or more appointments without notifying the office, you may be dismissed from the clinic at the provider's discretion.      For prescription refill requests, have your pharmacy contact our office and allow 72 hours for refills to be completed.    Today you received the following chemotherapy and/or immunotherapy agents Venofer 300 mg. Iron Sucrose Injection What is this medication? IRON SUCROSE (EYE ern SOO krose) treats low levels of iron (iron deficiency anemia) in people with kidney disease. Iron is a mineral that plays an important role in making red blood cells, which carry oxygen from your lungs to the rest of your body. This medicine may be used for other purposes; ask your health care provider or pharmacist if you have questions. COMMON BRAND NAME(S): Venofer What should I tell my care team before I take this medication? They need to know if you have any of these conditions: Anemia not caused by low iron levels Heart disease High levels of iron in the blood Kidney disease Liver disease An unusual or allergic reaction to iron, other medications, foods, dyes, or preservatives Pregnant or trying to get pregnant Breastfeeding How should I use this medication? This medication is for infusion into a vein. It is given in a hospital or clinic setting. Talk to your  care team about the use of this medication in children. While this medication may be prescribed for children as young as 2 years for selected conditions, precautions do apply. Overdosage: If you think you have taken too much of this medicine contact a poison control center or emergency room at once. NOTE: This medicine is only for you. Do not share this medicine with others. What if I miss a dose? Keep appointments for follow-up doses. It is important not to miss your dose. Call your care team if you are unable to keep an appointment. What may interact with this medication? Do not take this medication with any of the following: Deferoxamine Dimercaprol Other iron products This medication may also interact with the following: Chloramphenicol Deferasirox This list may not describe all possible interactions. Give your health care provider a list of all the medicines, herbs, non-prescription drugs, or dietary supplements you use. Also tell them if you smoke, drink alcohol, or use illegal drugs. Some items may interact with your medicine. What should I watch for while using this medication? Visit your care team regularly. Tell your care team if your symptoms do not start to get better or if they get worse. You may need blood work done while you are taking this medication. You may need to follow a special diet. Talk to your care team. Foods that contain iron include: whole grains/cereals, dried fruits, beans, or peas, leafy green vegetables, and organ meats (liver, kidney). What side effects may I notice from receiving this medication? Side effects that you should report to   your care team as soon as possible: Allergic reactions--skin rash, itching, hives, swelling of the face, lips, tongue, or throat Low blood pressure--dizziness, feeling faint or lightheaded, blurry vision Shortness of breath Side effects that usually do not require medical attention (report to your care team if they continue or are  bothersome): Flushing Headache Joint pain Muscle pain Nausea Pain, redness, or irritation at injection site This list may not describe all possible side effects. Call your doctor for medical advice about side effects. You may report side effects to FDA at 1-800-FDA-1088. Where should I keep my medication? This medication is given in a hospital or clinic and will not be stored at home. NOTE: This sheet is a summary. It may not cover all possible information. If you have questions about this medicine, talk to your doctor, pharmacist, or health care provider.  2023 Elsevier/Gold Standard (2020-04-05 00:00:00)       To help prevent nausea and vomiting after your treatment, we encourage you to take your nausea medication as directed.  BELOW ARE SYMPTOMS THAT SHOULD BE REPORTED IMMEDIATELY: *FEVER GREATER THAN 100.4 F (38 C) OR HIGHER *CHILLS OR SWEATING *NAUSEA AND VOMITING THAT IS NOT CONTROLLED WITH YOUR NAUSEA MEDICATION *UNUSUAL SHORTNESS OF BREATH *UNUSUAL BRUISING OR BLEEDING *URINARY PROBLEMS (pain or burning when urinating, or frequent urination) *BOWEL PROBLEMS (unusual diarrhea, constipation, pain near the anus) TENDERNESS IN MOUTH AND THROAT WITH OR WITHOUT PRESENCE OF ULCERS (sore throat, sores in mouth, or a toothache) UNUSUAL RASH, SWELLING OR PAIN  UNUSUAL VAGINAL DISCHARGE OR ITCHING   Items with * indicate a potential emergency and should be followed up as soon as possible or go to the Emergency Department if any problems should occur.  Please show the CHEMOTHERAPY ALERT CARD or IMMUNOTHERAPY ALERT CARD at check-in to the Emergency Department and triage nurse.  Should you have questions after your visit or need to cancel or reschedule your appointment, please contact [facility] [place] [phone]  and follow the prompts.  Office hours are 8:00 a.m. to 4:30 p.m. Monday - Friday. Please note that voicemails left after 4:00 p.m. may not be returned until  the following business day.  We are closed weekends and major holidays. You have access to a nurse at all times for urgent questions. Please call the main number to the clinic [phone] and follow the prompts.  For any non-urgent questions, you may also contact your provider using MyChart. We now offer e-Visits for anyone 18 and older to request care online for non-urgent symptoms. For details visit [email or web address].   Also download the MyChart app! Go to the app store, search "MyChart", open the app, select [facility], and log in with your MyChart username and password.  Masks are optional in the cancer centers. If you would like for your care team to wear a mask while they are taking care of you, please let them know. You may have one support person who is at least 33 years old accompany you for your appointments.  

## 2021-11-19 ENCOUNTER — Inpatient Hospital Stay: Payer: Medicaid Other

## 2021-11-26 ENCOUNTER — Inpatient Hospital Stay: Payer: Medicaid Other

## 2021-11-26 VITALS — BP 121/85 | HR 94 | Temp 97.9°F | Resp 18

## 2021-11-26 DIAGNOSIS — E611 Iron deficiency: Secondary | ICD-10-CM

## 2021-11-26 DIAGNOSIS — K912 Postsurgical malabsorption, not elsewhere classified: Secondary | ICD-10-CM

## 2021-11-26 DIAGNOSIS — D509 Iron deficiency anemia, unspecified: Secondary | ICD-10-CM | POA: Diagnosis not present

## 2021-11-26 DIAGNOSIS — D500 Iron deficiency anemia secondary to blood loss (chronic): Secondary | ICD-10-CM

## 2021-11-26 MED ORDER — SODIUM CHLORIDE 0.9 % IV SOLN
300.0000 mg | Freq: Once | INTRAVENOUS | Status: AC
  Administered 2021-11-26: 300 mg via INTRAVENOUS
  Filled 2021-11-26: qty 300

## 2021-11-26 MED ORDER — ACETAMINOPHEN 325 MG PO TABS
650.0000 mg | ORAL_TABLET | Freq: Once | ORAL | Status: AC
  Administered 2021-11-26: 650 mg via ORAL
  Filled 2021-11-26: qty 2

## 2021-11-26 MED ORDER — LORATADINE 10 MG PO TABS
10.0000 mg | ORAL_TABLET | Freq: Once | ORAL | Status: AC
  Administered 2021-11-26: 10 mg via ORAL
  Filled 2021-11-26: qty 1

## 2021-11-26 MED ORDER — SODIUM CHLORIDE 0.9 % IV SOLN: Freq: Once | INTRAVENOUS | Status: AC

## 2021-11-26 NOTE — Progress Notes (Signed)
Patient presents today for iron infusion.  Patient is in satisfactory condition with no complaints voiced.  Vital signs are stable.  We will proceed with iron per provider orders.  Patient tolerated treatment well with no complaints voiced.  Patient left ambulatory in stable condition.  Vital signs stable at discharge.  Follow up as scheduled.

## 2021-11-26 NOTE — Patient Instructions (Signed)
[facility] [place]  Discharge Instructions: Thank you for choosing [facility] to provide your oncology and hematology care.  If you have a lab appointment with the Cancer Center, please come in thru the Main Entrance and check in at the main information desk.  Wear comfortable clothing and clothing appropriate for easy access to any Portacath or PICC line.   We strive to give you quality time with your provider. You may need to reschedule your appointment if you arrive late (15 or more minutes).  Arriving late affects you and other patients whose appointments are after yours.  Also, if you miss three or more appointments without notifying the office, you may be dismissed from the clinic at the provider's discretion.      For prescription refill requests, have your pharmacy contact our office and allow 72 hours for refills to be completed.     To help prevent nausea and vomiting after your treatment, we encourage you to take your nausea medication as directed.  BELOW ARE SYMPTOMS THAT SHOULD BE REPORTED IMMEDIATELY: *FEVER GREATER THAN 100.4 F (38 C) OR HIGHER *CHILLS OR SWEATING *NAUSEA AND VOMITING THAT IS NOT CONTROLLED WITH YOUR NAUSEA MEDICATION *UNUSUAL SHORTNESS OF BREATH *UNUSUAL BRUISING OR BLEEDING *URINARY PROBLEMS (pain or burning when urinating, or frequent urination) *BOWEL PROBLEMS (unusual diarrhea, constipation, pain near the anus) TENDERNESS IN MOUTH AND THROAT WITH OR WITHOUT PRESENCE OF ULCERS (sore throat, sores in mouth, or a toothache) UNUSUAL RASH, SWELLING OR PAIN  UNUSUAL VAGINAL DISCHARGE OR ITCHING   Items with * indicate a potential emergency and should be followed up as soon as possible or go to the Emergency Department if any problems should occur.  Please show the CHEMOTHERAPY ALERT CARD or IMMUNOTHERAPY ALERT CARD at check-in to the Emergency Department and triage nurse.  Should you have questions after your visit or need to  cancel or reschedule your appointment, please contact [facility] [place] [phone]  and follow the prompts.  Office hours are 8:00 a.m. to 4:30 p.m. Monday - Friday. Please note that voicemails left after 4:00 p.m. may not be returned until the following business day.  We are closed weekends and major holidays. You have access to a nurse at all times for urgent questions. Please call the main number to the clinic [phone] and follow the prompts.  For any non-urgent questions, you may also contact your provider using MyChart. We now offer e-Visits for anyone 18 and older to request care online for non-urgent symptoms. For details visit [email or web address].   Also download the MyChart app! Go to the app store, search "MyChart", open the app, select [facility], and log in with your MyChart username and password.  Masks are optional in the cancer centers. If you would like for your care team to wear a mask while they are taking care of you, please let them know. You may have one support person who is at least 33 years old accompany you for your appointments.  

## 2021-12-03 ENCOUNTER — Inpatient Hospital Stay: Payer: Medicaid Other

## 2021-12-19 ENCOUNTER — Inpatient Hospital Stay: Payer: Medicaid Other | Attending: Hematology

## 2021-12-19 VITALS — BP 113/77 | HR 72 | Temp 97.6°F | Resp 18

## 2021-12-19 DIAGNOSIS — K912 Postsurgical malabsorption, not elsewhere classified: Secondary | ICD-10-CM

## 2021-12-19 DIAGNOSIS — D509 Iron deficiency anemia, unspecified: Secondary | ICD-10-CM | POA: Diagnosis not present

## 2021-12-19 DIAGNOSIS — E611 Iron deficiency: Secondary | ICD-10-CM

## 2021-12-19 DIAGNOSIS — D500 Iron deficiency anemia secondary to blood loss (chronic): Secondary | ICD-10-CM

## 2021-12-19 MED ORDER — ACETAMINOPHEN 325 MG PO TABS
650.0000 mg | ORAL_TABLET | Freq: Once | ORAL | Status: AC
  Administered 2021-12-19: 650 mg via ORAL
  Filled 2021-12-19: qty 2

## 2021-12-19 MED ORDER — LORATADINE 10 MG PO TABS
10.0000 mg | ORAL_TABLET | Freq: Once | ORAL | Status: AC
  Administered 2021-12-19: 10 mg via ORAL
  Filled 2021-12-19: qty 1

## 2021-12-19 MED ORDER — SODIUM CHLORIDE 0.9 % IV SOLN: Freq: Once | INTRAVENOUS | Status: AC

## 2021-12-19 MED ORDER — SODIUM CHLORIDE 0.9 % IV SOLN
300.0000 mg | Freq: Once | INTRAVENOUS | Status: AC
  Administered 2021-12-19: 300 mg via INTRAVENOUS
  Filled 2021-12-19: qty 300

## 2021-12-19 NOTE — Progress Notes (Signed)
Iron infusion given per orders. Patient tolerated it well without problems. Vitals stable and discharged home from clinic ambulatory. Follow up as scheduled.  

## 2021-12-19 NOTE — Patient Instructions (Signed)
[facility] [place]  Discharge Instructions: Thank you for choosing [facility] to provide your oncology and hematology care.  If you have a lab appointment with the Cancer Center, please come in thru the Main Entrance and check in at the main information desk.  Wear comfortable clothing and clothing appropriate for easy access to any Portacath or PICC line.   We strive to give you quality time with your provider. You may need to reschedule your appointment if you arrive late (15 or more minutes).  Arriving late affects you and other patients whose appointments are after yours.  Also, if you miss three or more appointments without notifying the office, you may be dismissed from the clinic at the provider's discretion.      For prescription refill requests, have your pharmacy contact our office and allow 72 hours for refills to be completed.    Today you received the following iron infusion .   To help prevent nausea and vomiting after your treatment, we encourage you to take your nausea medication as directed.  BELOW ARE SYMPTOMS THAT SHOULD BE REPORTED IMMEDIATELY: *FEVER GREATER THAN 100.4 F (38 C) OR HIGHER *CHILLS OR SWEATING *NAUSEA AND VOMITING THAT IS NOT CONTROLLED WITH YOUR NAUSEA MEDICATION *UNUSUAL SHORTNESS OF BREATH *UNUSUAL BRUISING OR BLEEDING *URINARY PROBLEMS (pain or burning when urinating, or frequent urination) *BOWEL PROBLEMS (unusual diarrhea, constipation, pain near the anus) TENDERNESS IN MOUTH AND THROAT WITH OR WITHOUT PRESENCE OF ULCERS (sore throat, sores in mouth, or a toothache) UNUSUAL RASH, SWELLING OR PAIN  UNUSUAL VAGINAL DISCHARGE OR ITCHING   Items with * indicate a potential emergency and should be followed up as soon as possible or go to the Emergency Department if any problems should occur.  Please show the CHEMOTHERAPY ALERT CARD or IMMUNOTHERAPY ALERT CARD at check-in to the Emergency Department and triage  nurse.  Should you have questions after your visit or need to cancel or reschedule your appointment, please contact [facility] [place] [phone]  and follow the prompts.  Office hours are 8:00 a.m. to 4:30 p.m. Monday - Friday. Please note that voicemails left after 4:00 p.m. may not be returned until the following business day.  We are closed weekends and major holidays. You have access to a nurse at all times for urgent questions. Please call the main number to the clinic [phone] and follow the prompts.  For any non-urgent questions, you may also contact your provider using MyChart. We now offer e-Visits for anyone 18 and older to request care online for non-urgent symptoms. For details visit [email or web address].   Also download the MyChart app! Go to the app store, search "MyChart", open the app, select [facility], and log in with your MyChart username and password.  Masks are optional in the cancer centers. If you would like for your care team to wear a mask while they are taking care of you, please let them know. You may have one support person who is at least 33 years old accompany you for your appointments.  

## 2022-01-30 ENCOUNTER — Inpatient Hospital Stay: Payer: Medicaid Other

## 2022-02-03 ENCOUNTER — Inpatient Hospital Stay: Payer: Medicaid Other | Attending: Hematology

## 2022-02-03 DIAGNOSIS — D500 Iron deficiency anemia secondary to blood loss (chronic): Secondary | ICD-10-CM

## 2022-02-03 DIAGNOSIS — D509 Iron deficiency anemia, unspecified: Secondary | ICD-10-CM | POA: Diagnosis not present

## 2022-02-03 LAB — CBC WITH DIFFERENTIAL/PLATELET
Abs Immature Granulocytes: 0.03 10*3/uL (ref 0.00–0.07)
Basophils Absolute: 0.1 10*3/uL (ref 0.0–0.1)
Basophils Relative: 1 %
Eosinophils Absolute: 0.2 10*3/uL (ref 0.0–0.5)
Eosinophils Relative: 3 %
HCT: 37.7 % (ref 36.0–46.0)
Hemoglobin: 12.3 g/dL (ref 12.0–15.0)
Immature Granulocytes: 0 %
Lymphocytes Relative: 28 %
Lymphs Abs: 2.4 10*3/uL (ref 0.7–4.0)
MCH: 28.2 pg (ref 26.0–34.0)
MCHC: 32.6 g/dL (ref 30.0–36.0)
MCV: 86.5 fL (ref 80.0–100.0)
Monocytes Absolute: 0.5 10*3/uL (ref 0.1–1.0)
Monocytes Relative: 6 %
Neutro Abs: 5.4 10*3/uL (ref 1.7–7.7)
Neutrophils Relative %: 62 %
Platelets: 300 10*3/uL (ref 150–400)
RBC: 4.36 MIL/uL (ref 3.87–5.11)
RDW: 14.6 % (ref 11.5–15.5)
WBC: 8.7 10*3/uL (ref 4.0–10.5)
nRBC: 0 % (ref 0.0–0.2)

## 2022-02-03 LAB — FERRITIN: Ferritin: 32 ng/mL (ref 11–307)

## 2022-02-03 LAB — IRON AND TIBC
Iron: 110 ug/dL (ref 28–170)
Saturation Ratios: 32 % — ABNORMAL HIGH (ref 10.4–31.8)
TIBC: 341 ug/dL (ref 250–450)
UIBC: 231 ug/dL

## 2022-02-06 NOTE — Progress Notes (Signed)
VIRTUAL VISIT via [place]   I connected with Alexis Montgomery  on 02/07/22 at  3:29 PM by telephone and verified that I am speaking with the correct person using two identifiers.  Location: Patient: Home Provider: [facility]   I discussed the limitations, risks, security and privacy concerns of performing an evaluation and management service by telephone and the availability of in person appointments. I also discussed with the patient that there may be a patient responsible charge related to this service. The patient expressed understanding and agreed to proceed.  REASON FOR VISIT: Iron deficiency anemia   PRIOR THERAPY: Oral iron supplement   CURRENT THERAPY: Intermittent IV iron (last Venofer 300 mg x 3 from 11/13/2021 through 12/19/2021)  INTERVAL HISTORY:  Alexis Montgomery is contacted today for follow-up of her iron deficiency anemia.Marland Kitchen  She was last evaluated via telemedicine visit by Tarri Abernethy PA-C on 10/29/2021.  At today's visit, she reports feeling ongoing significant fatigue, although she had some transient improvement after her IV iron in October 2023.  She denies any ice pica.  No chest pain, dyspnea on exertion, headaches, lightheadedness, syncope, or palpitations.  She continues to have heavy menstrual cycles that last up to 10 days, with the first 5 days described as "very heavy with lots of clots."  She denies any other bleeding such as bright red blood per rectum or melena. She reports 40% energy and 100% appetite.  REVIEW OF SYSTEMS:   Review of Systems  Constitutional:  Positive for malaise/fatigue. Negative for chills, diaphoresis, fever and weight loss.  Respiratory:  Negative for cough and shortness of breath.   Cardiovascular:  Negative for chest pain and palpitations.  Gastrointestinal:  Negative for abdominal pain, blood in stool, melena, nausea and vomiting.  Neurological:  Negative for dizziness and  headaches.  Psychiatric/Behavioral:  The patient has insomnia.      PHYSICAL EXAM: (per limitations of virtual telephone visit)  The patient is alert and oriented x 3, exhibiting adequate mentation, good mood, and ability to speak in full sentences and execute sound judgement.  ASSESSMENT & PLAN:  1.    Iron deficiency anemia - Patient seen at the request of her bariatric physician, Dr. Loyal Gambler, for the evaluation and treatment of iron deficiency anemia. 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 - Failed to improve on oral iron supplementation and has required IV iron in the past - IV Venofer 300 mg x 3 from 04/18/2021 through 05/13/2021 - Reports significant fatigue. - Reports menorrhagia (follows with GYN).  No melena or hematochezia. - Most recent labs (02/03/2022): Hgb 12.3, ferritin 32, iron saturation 32 %  - PLAN: Recommend additional IV iron with Feraheme x 2 - Repeat labs and RTC in 3 months with phone visit - Her other nutritional deficiencies (B12 and vitamin D) are monitored and treated by her bariatric provider   2.  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  She denies any tobacco, alcohol, or illicit drug use. - FAMILY: Maternal aunt with iron deficiency anemia.  No known family history of malignancy or other blood conditions.   PLAN SUMMARY: >> Feraheme x 2 >> Labs in 3 months >> PHONE visit 1 week after labs     I discussed the assessment and treatment plan with the patient. The patient was  provided an opportunity to ask questions and all were answered. The patient agreed with the plan and demonstrated an understanding of the instructions.   The patient was advised to call back or seek an in-person evaluation if the symptoms worsen or if the condition fails to improve as anticipated.  I provided 18  minutes of non-face-to-face time during this encounter.   Harriett Rush, PA-C 02/07/22 3:55 PM

## 2022-02-07 ENCOUNTER — Inpatient Hospital Stay: Payer: Medicaid Other | Attending: Hematology | Admitting: Physician Assistant

## 2022-02-07 DIAGNOSIS — K912 Postsurgical malabsorption, not elsewhere classified: Secondary | ICD-10-CM | POA: Diagnosis not present

## 2022-02-07 DIAGNOSIS — D500 Iron deficiency anemia secondary to blood loss (chronic): Secondary | ICD-10-CM | POA: Diagnosis not present

## 2022-02-07 DIAGNOSIS — Z9884 Bariatric surgery status: Secondary | ICD-10-CM

## 2022-02-10 ENCOUNTER — Other Ambulatory Visit: Payer: Self-pay

## 2022-02-10 DIAGNOSIS — Z9884 Bariatric surgery status: Secondary | ICD-10-CM

## 2022-02-10 DIAGNOSIS — E611 Iron deficiency: Secondary | ICD-10-CM

## 2022-02-10 DIAGNOSIS — D500 Iron deficiency anemia secondary to blood loss (chronic): Secondary | ICD-10-CM

## 2022-02-14 ENCOUNTER — Inpatient Hospital Stay: Payer: Medicaid Other

## 2022-02-21 ENCOUNTER — Inpatient Hospital Stay: Payer: Medicaid Other

## 2022-02-27 ENCOUNTER — Inpatient Hospital Stay: Payer: Medicaid Other

## 2022-03-04 ENCOUNTER — Inpatient Hospital Stay: Payer: Medicaid Other

## 2022-03-11 ENCOUNTER — Inpatient Hospital Stay: Payer: Medicaid Other | Attending: Hematology

## 2022-03-11 VITALS — BP 109/80 | HR 84 | Temp 97.7°F | Resp 18

## 2022-03-11 DIAGNOSIS — K912 Postsurgical malabsorption, not elsewhere classified: Secondary | ICD-10-CM

## 2022-03-11 DIAGNOSIS — D500 Iron deficiency anemia secondary to blood loss (chronic): Secondary | ICD-10-CM

## 2022-03-11 DIAGNOSIS — D509 Iron deficiency anemia, unspecified: Secondary | ICD-10-CM | POA: Diagnosis present

## 2022-03-11 DIAGNOSIS — E611 Iron deficiency: Secondary | ICD-10-CM

## 2022-03-11 MED ORDER — CETIRIZINE HCL 10 MG PO TABS
10.0000 mg | ORAL_TABLET | Freq: Once | ORAL | Status: AC
  Administered 2022-03-11: 10 mg via ORAL
  Filled 2022-03-11: qty 1

## 2022-03-11 MED ORDER — SODIUM CHLORIDE 0.9 % IV SOLN: Freq: Once | INTRAVENOUS | Status: AC

## 2022-03-11 MED ORDER — SODIUM CHLORIDE 0.9 % IV SOLN
510.0000 mg | Freq: Once | INTRAVENOUS | Status: AC
  Administered 2022-03-11: 510 mg via INTRAVENOUS
  Filled 2022-03-11: qty 510

## 2022-03-11 MED ORDER — ACETAMINOPHEN 325 MG PO TABS
650.0000 mg | ORAL_TABLET | Freq: Once | ORAL | Status: AC
  Administered 2022-03-11: 650 mg via ORAL
  Filled 2022-03-11: qty 2

## 2022-03-11 NOTE — Progress Notes (Signed)
Pt presents today for Feraheme IV iron infusion per provider's order. Vital signs stable and pt voiced no new complaints at this time.  Peripheral IV started with good blood return pre and post infusion.  Feraheme given today per MD orders. Tolerated infusion without adverse affects. Vital signs stable. No complaints at this time. Discharged from clinic ambulatory in stable condition. Alert and oriented x 3. F/U with [facility] as scheduled.    

## 2022-03-11 NOTE — Patient Instructions (Signed)
[facility]  Discharge Instructions: Thank you for choosing [facility] to provide your oncology and hematology care.  If you have a lab appointment with the [facility], please come in thru the Main Entrance and check in at the main information desk.  Wear comfortable clothing and clothing appropriate for easy access to any Portacath or PICC line.   We strive to give you quality time with your provider. You may need to reschedule your appointment if you arrive late (15 or more minutes).  Arriving late affects you and other patients whose appointments are after yours.  Also, if you miss three or more appointments without notifying the office, you may be dismissed from the clinic at the provider's discretion.      For prescription refill requests, have your pharmacy contact our office and allow 72 hours for refills to be completed.    Today you received Feraheme IV iron infusion     BELOW ARE SYMPTOMS THAT SHOULD BE REPORTED IMMEDIATELY: *FEVER GREATER THAN 100.4 F (38 C) OR HIGHER *CHILLS OR SWEATING *NAUSEA AND VOMITING THAT IS NOT CONTROLLED WITH YOUR NAUSEA MEDICATION *UNUSUAL SHORTNESS OF BREATH *UNUSUAL BRUISING OR BLEEDING *URINARY PROBLEMS (pain or burning when urinating, or frequent urination) *BOWEL PROBLEMS (unusual diarrhea, constipation, pain near the anus) TENDERNESS IN MOUTH AND THROAT WITH OR WITHOUT PRESENCE OF ULCERS (sore throat, sores in mouth, or a toothache) UNUSUAL RASH, SWELLING OR PAIN  UNUSUAL VAGINAL DISCHARGE OR ITCHING   Items with * indicate a potential emergency and should be followed up as soon as possible or go to the Emergency Department if any problems should occur.  Please show the CHEMOTHERAPY ALERT CARD or IMMUNOTHERAPY ALERT CARD at check-in to the Emergency Department and triage nurse.  Should you have questions after your visit or need to cancel or reschedule your appointment, please contact [facility] [phone]  and follow the prompts.  Office hours are 8:00 a.m. to 4:30 p.m. Monday - Friday. Please note that voicemails left after 4:00 p.m. may not be returned until the following business day.  We are closed weekends and major holidays. You have access to a nurse at all times for urgent questions. Please call the main number to the clinic [phone] and follow the prompts.  For any non-urgent questions, you may also contact your provider using MyChart. We now offer e-Visits for anyone 21 and older to request care online for non-urgent symptoms. For details visit [email or web address].   Also download the MyChart app! Go to the app store, search "MyChart", open the app, select [facility], and log in with your MyChart username and password.

## 2022-03-18 ENCOUNTER — Inpatient Hospital Stay: Payer: Medicaid Other

## 2022-03-18 VITALS — BP 117/80 | HR 80 | Temp 97.3°F | Resp 18

## 2022-03-18 DIAGNOSIS — E611 Iron deficiency: Secondary | ICD-10-CM

## 2022-03-18 DIAGNOSIS — K912 Postsurgical malabsorption, not elsewhere classified: Secondary | ICD-10-CM

## 2022-03-18 DIAGNOSIS — D500 Iron deficiency anemia secondary to blood loss (chronic): Secondary | ICD-10-CM

## 2022-03-18 DIAGNOSIS — D509 Iron deficiency anemia, unspecified: Secondary | ICD-10-CM | POA: Diagnosis not present

## 2022-03-18 MED ORDER — CETIRIZINE HCL 10 MG PO TABS
10.0000 mg | ORAL_TABLET | Freq: Once | ORAL | Status: AC
  Administered 2022-03-18: 10 mg via ORAL
  Filled 2022-03-18: qty 1

## 2022-03-18 MED ORDER — SODIUM CHLORIDE 0.9 % IV SOLN
510.0000 mg | Freq: Once | INTRAVENOUS | Status: AC
  Administered 2022-03-18: 510 mg via INTRAVENOUS
  Filled 2022-03-18: qty 510

## 2022-03-18 MED ORDER — ACETAMINOPHEN 325 MG PO TABS
650.0000 mg | ORAL_TABLET | Freq: Once | ORAL | Status: AC
  Administered 2022-03-18: 650 mg via ORAL
  Filled 2022-03-18: qty 2

## 2022-03-18 MED ORDER — SODIUM CHLORIDE 0.9 % IV SOLN: Freq: Once | INTRAVENOUS | Status: AC

## 2022-03-18 NOTE — Progress Notes (Signed)
Pt presents today for Feraheme IV iron infusion per provider's order. Vital signs stable and pt voiced no new complaints at this time.  Peripheral IV started with good blood return pre and post infusion.  Feraheme given today per MD orders. Tolerated infusion without adverse affects. Vital signs stable. No complaints at this time. Discharged from clinic ambulatory in stable condition. Alert and oriented x 3. F/U with [facility] as scheduled.    

## 2022-03-18 NOTE — Patient Instructions (Signed)
[facility]  Discharge Instructions: Thank you for choosing [facility] to provide your oncology and hematology care.  If you have a lab appointment with the Cancer Center, please come in thru the Main Entrance and check in at the main information desk.  Wear comfortable clothing and clothing appropriate for easy access to any Portacath or PICC line.   We strive to give you quality time with your provider. You may need to reschedule your appointment if you arrive late (15 or more minutes).  Arriving late affects you and other patients whose appointments are after yours.  Also, if you miss three or more appointments without notifying the office, you may be dismissed from the clinic at the provider's discretion.      For prescription refill requests, have your pharmacy contact our office and allow 72 hours for refills to be completed.    Today you received Feraheme IV iron infusion.     BELOW ARE SYMPTOMS THAT SHOULD BE REPORTED IMMEDIATELY: *FEVER GREATER THAN 100.4 F (38 C) OR HIGHER *CHILLS OR SWEATING *NAUSEA AND VOMITING THAT IS NOT CONTROLLED WITH YOUR NAUSEA MEDICATION *UNUSUAL SHORTNESS OF BREATH *UNUSUAL BRUISING OR BLEEDING *URINARY PROBLEMS (pain or burning when urinating, or frequent urination) *BOWEL PROBLEMS (unusual diarrhea, constipation, pain near the anus) TENDERNESS IN MOUTH AND THROAT WITH OR WITHOUT PRESENCE OF ULCERS (sore throat, sores in mouth, or a toothache) UNUSUAL RASH, SWELLING OR PAIN  UNUSUAL VAGINAL DISCHARGE OR ITCHING   Items with * indicate a potential emergency and should be followed up as soon as possible or go to the Emergency Department if any problems should occur.  Please show the CHEMOTHERAPY ALERT CARD or IMMUNOTHERAPY ALERT CARD at check-in to the Emergency Department and triage nurse.  Should you have questions after your visit or need to cancel or reschedule your appointment, please contact [facility] [phone]  and follow the prompts.  Office hours are 8:00 a.m. to 4:30 p.m. Monday - Friday. Please note that voicemails left after 4:00 p.m. may not be returned until the following business day.  We are closed weekends and major holidays. You have access to a nurse at all times for urgent questions. Please call the main number to the clinic [phone] and follow the prompts.  For any non-urgent questions, you may also contact your provider using MyChart. We now offer e-Visits for anyone 18 and older to request care online for non-urgent symptoms. For details visit [email or web address].   Also download the MyChart app! Go to the app store, search "MyChart", open the app, select [facility], and log in with your MyChart username and password.   

## 2022-05-09 ENCOUNTER — Inpatient Hospital Stay: Payer: Medicaid Other

## 2022-05-13 ENCOUNTER — Inpatient Hospital Stay: Payer: Medicaid Other | Attending: Hematology

## 2022-05-13 DIAGNOSIS — E611 Iron deficiency: Secondary | ICD-10-CM

## 2022-05-13 DIAGNOSIS — D500 Iron deficiency anemia secondary to blood loss (chronic): Secondary | ICD-10-CM

## 2022-05-13 DIAGNOSIS — Z9884 Bariatric surgery status: Secondary | ICD-10-CM

## 2022-05-13 DIAGNOSIS — D509 Iron deficiency anemia, unspecified: Secondary | ICD-10-CM | POA: Diagnosis present

## 2022-05-13 LAB — CBC WITH DIFFERENTIAL/PLATELET
Abs Immature Granulocytes: 0.03 10*3/uL (ref 0.00–0.07)
Basophils Absolute: 0.1 10*3/uL (ref 0.0–0.1)
Basophils Relative: 1 %
Eosinophils Absolute: 0.3 10*3/uL (ref 0.0–0.5)
Eosinophils Relative: 4 %
HCT: 39.3 % (ref 36.0–46.0)
Hemoglobin: 13.1 g/dL (ref 12.0–15.0)
Immature Granulocytes: 0 %
Lymphocytes Relative: 27 %
Lymphs Abs: 2.2 10*3/uL (ref 0.7–4.0)
MCH: 29.7 pg (ref 26.0–34.0)
MCHC: 33.3 g/dL (ref 30.0–36.0)
MCV: 89.1 fL (ref 80.0–100.0)
Monocytes Absolute: 0.5 10*3/uL (ref 0.1–1.0)
Monocytes Relative: 6 %
Neutro Abs: 4.8 10*3/uL (ref 1.7–7.7)
Neutrophils Relative %: 62 %
Platelets: 272 10*3/uL (ref 150–400)
RBC: 4.41 MIL/uL (ref 3.87–5.11)
RDW: 13 % (ref 11.5–15.5)
WBC: 7.9 10*3/uL (ref 4.0–10.5)
nRBC: 0 % (ref 0.0–0.2)

## 2022-05-13 LAB — IRON AND TIBC
Iron: 137 ug/dL (ref 28–170)
Saturation Ratios: 44 % — ABNORMAL HIGH (ref 10.4–31.8)
TIBC: 309 ug/dL (ref 250–450)
UIBC: 172 ug/dL

## 2022-05-13 LAB — FERRITIN: Ferritin: 164 ng/mL (ref 11–307)

## 2022-05-15 ENCOUNTER — Telehealth: Payer: Medicaid Other | Admitting: Physician Assistant

## 2022-05-19 NOTE — Progress Notes (Unsigned)
VIRTUAL VISIT via TELEPHONE NOTE [facility]   I connected with Alexis Montgomery  on 05/20/22 at 1:00 PM by telephone and verified that I am speaking with the correct person using two identifiers.  Location: Patient: Home Provider: [facility]   I discussed the limitations, risks, security and privacy concerns of performing an evaluation and management service by telephone and the availability of in person appointments. I also discussed with the patient that there may be a patient responsible charge related to this service. The patient expressed understanding and agreed to proceed.  REASON FOR VISIT: Iron deficiency anemia   PRIOR THERAPY: Oral iron supplement   CURRENT THERAPY: Intermittent IV iron (last Feraheme times 08 March 2022)  INTERVAL HISTORY:  Ms. Alexis Montgomery is contacted today for follow-up of her iron deficiency anemia.Marland Kitchen  She was last evaluated via telemedicine visit by Rojelio Brenner PA-C on 02/07/2022.  At today's visit, she reports feeling fair.  She reports feeling no significant improvement after her IV iron in March 2024.  She denies any ice pica.  No chest pain, dyspnea on exertion, headaches, lightheadedness, syncope, or palpitations.  She continues to have heavy menstrual cycles that last up to 10 days, with the first 5 days described as "very heavy with lots of clots."  She denies any other bleeding such as bright red blood per rectum or melena. She reports 75% energy and 100% appetite.  REVIEW OF SYSTEMS:   Review of Systems  Constitutional:  Positive for malaise/fatigue. Negative for chills, diaphoresis, fever and weight loss.  Respiratory:  Negative for cough and shortness of breath.   Cardiovascular:  Negative for chest pain and palpitations.  Gastrointestinal:  Negative for abdominal pain, blood in stool, melena, nausea and vomiting.  Neurological:  Negative for dizziness and headaches.  Psychiatric/Behavioral:  The patient has  insomnia.      PHYSICAL EXAM: (per limitations of virtual telephone visit)  The patient is alert and oriented x 3, exhibiting adequate mentation, good mood, and ability to speak in full sentences and execute sound judgement.  ASSESSMENT & PLAN:  1.    Iron deficiency anemia - Patient seen at the request of her bariatric physician, Dr. Seymour Bars, for the evaluation and treatment of iron deficiency anemia. 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 - Failed to improve on oral iron supplementation and has required IV iron in the past, most recently with IV Feraheme x 2 in March 2024. - Reports significant fatigue. - Reports menorrhagia (follows with GYN).   No melena or hematochezia. - Most recent labs (05/13/2022): Hgb 13.1, ferritin 164, iron saturation 44%  - PLAN: No indication for IV iron at this time - Repeat labs and RTC in 4 months with OFFICE visit  - Her other nutritional deficiencies (B12 and vitamin D) are monitored and treated by her bariatric provider   2.  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  She denies any tobacco, alcohol, or illicit drug use. - FAMILY: Maternal aunt with iron deficiency anemia.  No known family history of malignancy or other blood conditions.   PLAN SUMMARY: >> Labs in 4 months = CBC/D, ferritin, iron/TIBC >> OFFICE visit in 4 months (1 week after labs)     I discussed the assessment and treatment plan with the patient. The patient was provided an opportunity  to ask questions and all were answered. The patient agreed with the plan and demonstrated an understanding of the instructions.   The patient was advised to call back or seek an in-person evaluation if the symptoms worsen or if the condition fails to improve as anticipated.  I provided 15 minutes of non-face-to-face time during this  encounter.  Carnella Guadalajara, PA-C 05/20/22 1:12 PM

## 2022-05-20 ENCOUNTER — Other Ambulatory Visit: Payer: Self-pay

## 2022-05-20 ENCOUNTER — Inpatient Hospital Stay (HOSPITAL_BASED_OUTPATIENT_CLINIC_OR_DEPARTMENT_OTHER): Payer: Medicaid Other | Admitting: Physician Assistant

## 2022-05-20 ENCOUNTER — Encounter: Payer: Self-pay | Admitting: Physician Assistant

## 2022-05-20 DIAGNOSIS — Z9884 Bariatric surgery status: Secondary | ICD-10-CM | POA: Diagnosis not present

## 2022-05-20 DIAGNOSIS — D500 Iron deficiency anemia secondary to blood loss (chronic): Secondary | ICD-10-CM | POA: Diagnosis not present

## 2022-05-20 DIAGNOSIS — K912 Postsurgical malabsorption, not elsewhere classified: Secondary | ICD-10-CM

## 2022-09-19 ENCOUNTER — Inpatient Hospital Stay: Payer: Medicaid Other | Attending: Hematology

## 2022-09-25 ENCOUNTER — Inpatient Hospital Stay: Payer: Medicaid Other | Admitting: Oncology

## 2023-05-07 ENCOUNTER — Encounter: Admitting: Adult Health

## 2023-08-20 ENCOUNTER — Other Ambulatory Visit: Payer: Self-pay

## 2023-11-18 ENCOUNTER — Ambulatory Visit (INDEPENDENT_AMBULATORY_CARE_PROVIDER_SITE_OTHER)

## 2023-11-18 ENCOUNTER — Ambulatory Visit: Admitting: Podiatry

## 2023-11-18 ENCOUNTER — Encounter: Payer: Self-pay | Admitting: Podiatry

## 2023-11-18 ENCOUNTER — Other Ambulatory Visit: Payer: Self-pay | Admitting: Podiatry

## 2023-11-18 DIAGNOSIS — M21611 Bunion of right foot: Secondary | ICD-10-CM

## 2023-11-18 DIAGNOSIS — M722 Plantar fascial fibromatosis: Secondary | ICD-10-CM

## 2023-11-18 NOTE — Progress Notes (Signed)
  Subjective:  Patient ID: Alexis Montgomery, female    DOB: Aug 20, 1990,   MRN: [id number]  Chief Complaint  Patient presents with   Foot Pain    It's my bunion on my right foot, it hurts.    33 y.o. female presents for concern of right foot bunion.  Relates she has had this for years.  She had her left foot bunion corrected by Dr. Burt back in 2021.  She relates she has tried different shoes and padding and.Right foot has continued to be a problem.  She relates left foot has done very well and would like to proceed with surgery on the right. Denies any other pedal complaints. Denies n/v/f/c.   Past Medical History:  Diagnosis Date   Anemia    Benign essential HTN 10/09/2014   Family history of adverse reaction to anesthesia    mother is a [place]   Hidradenitis suppurativa 10/09/2014   Menorrhagia with irregular cycle 06/11/2015   Morbid obesity (HCC) 10/09/2014   MRSA infection 10/09/2014   Ovarian cyst    PONV (postoperative nausea and vomiting)    Pregnancy induced hypertension    Tachycardia     Objective:  Physical Exam: Vascular: DP/PT pulses 2/4 bilateral. CFT <3 seconds. Normal hair growth on digits. No edema.  Skin. No lacerations or abrasions bilateral feet.  Musculoskeletal: MMT 5/5 bilateral lower extremities in DF, PF, Inversion and Eversion. Deceased ROM in DF of ankle joint. 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  No pain with range of motion of the first MPJ hypermobility noted of the first ray.  No pain to second metatarsal or second digit. Neurological: Sensation intact to light touch.   Assessment:   1. Bunion, right      Plan:  Patient was evaluated and treated and all questions answered. -Xrays reviewed no acute fractures or dislocations noted.  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  No degenerative changes noted of the first MPJ sesamoid position of about 5 -Discussed HAV and treatment  options;conservative and surgical management; risks, benefits, alternatives discussed. All patient's questions answered. -Discussed padding and wide shoe gear.   -Recommend continue with good supportive shoes and inserts.  -Discussed surgical options.  Discussed with patient repeating procedure she had on the left which would involve right foot Lapidus bunionectomy.  Discussed perioperative course in detail. -Informed surgical risk consent was reviewed and read aloud to the patient.  I reviewed the films.  I have discussed my findings with the patient in great detail.  I have discussed all risks including but not limited to infection, stiffness, scarring, limp, disability, deformity, damage to blood vessels and nerves, numbness, poor healing, need for braces, arthritis, chronic pain, amputation, death.  All benefits and realistic expectations discussed in great detail.  I have made no promises as to the outcome.  I have provided realistic expectations.  I have offered the patient a 2nd opinion, which they have declined and assured me they preferred to proceed despite the risks. - We will plan for surgery in December. Postop meds: Zofran , oxycodone  5/325 mg, aspirin  twice daily, Keflex    Alexis Montgomery, DPM

## 2023-11-23 ENCOUNTER — Telehealth: Payer: Self-pay | Admitting: Podiatry

## 2023-11-23 NOTE — Telephone Encounter (Signed)
Called and left message for patient to call and schedule surgery

## 2023-11-30 ENCOUNTER — Encounter: Payer: Self-pay | Admitting: Podiatry

## 2023-12-18 NOTE — Telephone Encounter (Signed)
 Patient called and is scheduled for surgery on 01/05/2024. Patient not on any GLP1 medications or blood thinners. Patient pharmacy is correct in chart.

## 2023-12-21 ENCOUNTER — Telehealth: Payer: Self-pay | Admitting: Podiatry

## 2023-12-21 NOTE — Telephone Encounter (Signed)
 DOS- 01/05/2024  LAP[employer] BUNIONECTOMY RT- 71702  [employer] EFFECTIVE DATE- 08/07/2019  PER CARELON PORTAL, PRIOR AUTH FOR CPT CODE 71702 HAS BEEN APPROVED FROM 01/05/2024-03/04/2024. AUTH# [id number]

## 2023-12-29 ENCOUNTER — Telehealth: Payer: Self-pay | Admitting: Podiatry

## 2023-12-29 NOTE — Telephone Encounter (Signed)
 Called patient in regards to surgery and needing to move it to the hospital as patient insurance will not cover price of implant. Patient has been scheduled for 04/12/2024 at [facility]. Spoke with Dr.Sikora in regards to issue.
